# Patient Record
Sex: Male | Born: 1940
Health system: Southern US, Community
[De-identification: ages and names within clinical notes are randomized; demographics above are authoritative.]

## PROBLEM LIST (undated history)

## (undated) DIAGNOSIS — I700 Atherosclerosis of aorta: Secondary | ICD-10-CM

## (undated) DIAGNOSIS — I358 Other nonrheumatic aortic valve disorders: Secondary | ICD-10-CM

## (undated) DIAGNOSIS — G4730 Sleep apnea, unspecified: Secondary | ICD-10-CM

## (undated) DIAGNOSIS — R7989 Other specified abnormal findings of blood chemistry: Secondary | ICD-10-CM

## (undated) DIAGNOSIS — R55 Syncope and collapse: Secondary | ICD-10-CM

## (undated) DIAGNOSIS — E119 Type 2 diabetes mellitus without complications: Secondary | ICD-10-CM

## (undated) DIAGNOSIS — K449 Diaphragmatic hernia without obstruction or gangrene: Secondary | ICD-10-CM

## (undated) DIAGNOSIS — K219 Gastro-esophageal reflux disease without esophagitis: Secondary | ICD-10-CM

## (undated) DIAGNOSIS — Z9861 Coronary angioplasty status: Secondary | ICD-10-CM

## (undated) DIAGNOSIS — I2510 Atherosclerotic heart disease of native coronary artery without angina pectoris: Secondary | ICD-10-CM

## (undated) DIAGNOSIS — F329 Major depressive disorder, single episode, unspecified: Secondary | ICD-10-CM

## (undated) DIAGNOSIS — E785 Hyperlipidemia, unspecified: Secondary | ICD-10-CM

## (undated) DIAGNOSIS — K5730 Diverticulosis of large intestine without perforation or abscess without bleeding: Secondary | ICD-10-CM

## (undated) DIAGNOSIS — R002 Palpitations: Secondary | ICD-10-CM

## (undated) DIAGNOSIS — E538 Deficiency of other specified B group vitamins: Secondary | ICD-10-CM

## (undated) DIAGNOSIS — Z8614 Personal history of Methicillin resistant Staphylococcus aureus infection: Secondary | ICD-10-CM

## (undated) DIAGNOSIS — F32A Depression, unspecified: Secondary | ICD-10-CM

## (undated) DIAGNOSIS — I10 Essential (primary) hypertension: Secondary | ICD-10-CM

## (undated) DIAGNOSIS — M1990 Unspecified osteoarthritis, unspecified site: Secondary | ICD-10-CM

## (undated) HISTORY — DX: Deficiency of other specified B group vitamins: E53.8

## (undated) HISTORY — DX: Unspecified osteoarthritis, unspecified site: M19.90

## (undated) HISTORY — PX: REPLACEMENT TOTAL KNEE: SUR1224

## (undated) HISTORY — DX: Hyperlipidemia, unspecified: E78.5

## (undated) HISTORY — DX: Other nonrheumatic aortic valve disorders: I35.8

## (undated) HISTORY — DX: Gastro-esophageal reflux disease without esophagitis: K21.9

## (undated) HISTORY — DX: Type 2 diabetes mellitus without complications: E11.9

## (undated) HISTORY — DX: Personal history of Methicillin resistant Staphylococcus aureus infection: Z86.14

## (undated) HISTORY — DX: Depression, unspecified: F32.A

## (undated) HISTORY — DX: Major depressive disorder, single episode, unspecified: F32.9

## (undated) HISTORY — DX: Syncope and collapse: R55

## (undated) HISTORY — DX: Essential (primary) hypertension: I10

## (undated) HISTORY — PX: TONSILLECTOMY: SUR1361

## (undated) HISTORY — DX: Diverticulosis of large intestine without perforation or abscess without bleeding: K57.30

## (undated) HISTORY — DX: Atherosclerosis of aorta: I70.0

## (undated) HISTORY — DX: Other specified abnormal findings of blood chemistry: R79.89

## (undated) HISTORY — DX: Diaphragmatic hernia without obstruction or gangrene: K44.9

## (undated) HISTORY — DX: Palpitations: R00.2

---

## 2004-06-12 ENCOUNTER — Emergency Department (HOSPITAL_COMMUNITY): Admission: EM | Admit: 2004-06-12 | Discharge: 2004-06-12 | Payer: Self-pay | Admitting: Emergency Medicine

## 2004-07-17 ENCOUNTER — Encounter (INDEPENDENT_AMBULATORY_CARE_PROVIDER_SITE_OTHER): Payer: Self-pay | Admitting: Specialist

## 2004-07-17 ENCOUNTER — Ambulatory Visit (HOSPITAL_COMMUNITY): Admission: RE | Admit: 2004-07-17 | Discharge: 2004-07-17 | Payer: Self-pay | Admitting: Gastroenterology

## 2006-04-30 ENCOUNTER — Emergency Department (HOSPITAL_COMMUNITY): Admission: EM | Admit: 2006-04-30 | Discharge: 2006-04-30 | Payer: Self-pay | Admitting: Family Medicine

## 2006-09-28 ENCOUNTER — Emergency Department (HOSPITAL_COMMUNITY): Admission: EM | Admit: 2006-09-28 | Discharge: 2006-09-28 | Payer: Self-pay | Admitting: Emergency Medicine

## 2006-12-07 ENCOUNTER — Inpatient Hospital Stay (HOSPITAL_COMMUNITY): Admission: RE | Admit: 2006-12-07 | Discharge: 2006-12-09 | Payer: Self-pay | Admitting: Orthopedic Surgery

## 2007-03-22 ENCOUNTER — Inpatient Hospital Stay (HOSPITAL_COMMUNITY): Admission: RE | Admit: 2007-03-22 | Discharge: 2007-03-26 | Payer: Self-pay | Admitting: Orthopedic Surgery

## 2007-03-22 ENCOUNTER — Ambulatory Visit: Payer: Self-pay | Admitting: Internal Medicine

## 2007-03-25 ENCOUNTER — Ambulatory Visit: Payer: Self-pay | Admitting: Vascular Surgery

## 2007-03-25 ENCOUNTER — Encounter (INDEPENDENT_AMBULATORY_CARE_PROVIDER_SITE_OTHER): Payer: Self-pay | Admitting: Orthopedic Surgery

## 2007-08-04 ENCOUNTER — Ambulatory Visit (HOSPITAL_BASED_OUTPATIENT_CLINIC_OR_DEPARTMENT_OTHER): Admission: RE | Admit: 2007-08-04 | Discharge: 2007-08-04 | Payer: Self-pay | Admitting: Family Medicine

## 2007-08-12 ENCOUNTER — Ambulatory Visit: Payer: Self-pay | Admitting: Internal Medicine

## 2008-07-18 ENCOUNTER — Encounter: Admission: RE | Admit: 2008-07-18 | Discharge: 2008-07-18 | Payer: Self-pay | Admitting: Family Medicine

## 2008-08-03 ENCOUNTER — Encounter: Admission: RE | Admit: 2008-08-03 | Discharge: 2008-08-03 | Payer: Self-pay | Admitting: Family Medicine

## 2010-09-07 ENCOUNTER — Observation Stay: Payer: Self-pay | Admitting: Internal Medicine

## 2011-02-14 ENCOUNTER — Ambulatory Visit: Payer: Self-pay | Admitting: Family Medicine

## 2011-03-21 NOTE — Op Note (Signed)
NAME:  Johnny Kane, Johnny Kane NO.:  [id number]   MEDICAL RECORD NO.:  [id number]          PATIENT TYPE:  INP   LOCATION:  5023                         FACILITY:  [facility]   PHYSICIAN:  Elana Alm. Thurston Kane, M.D. DATE OF BIRTH:  [birth year]   DATE OF PROCEDURE:  12/07/2006  DATE OF DISCHARGE:                               OPERATIVE REPORT   PREOPERATIVE DIAGNOSIS:  Right knee degenerative joint disease.   POSTOPERATIVE DIAGNOSIS:  Right knee degenerative joint disease.   PROCEDURE:  Right total knee replacement using DePuy cemented total knee  system with #5 cemented femur, #5 cemented tibia with 10 mm polyethylene  RP tibial spacer and 38 mm polyethylene cemented patella.   SURGEON:  Elana Alm. Thurston Kane, M.D.   ASSISTANT:  Aura Fey. Bobbe Medico.   ANESTHESIA:  General.   OPERATIVE TIME:  1 hour 20 minutes.   COMPLICATIONS:  None.   DESCRIPTION OF PROCEDURE:  Johnny Kane was brought to operating room on  12/07/2006, placed operative table in supine position.  He received  vancomycin 1 gram IV preoperatively for prophylaxis.  After being placed  under general anesthesia, he had a Foley catheter placed under sterile  conditions without complications.  His right knee was examined.  Range  of motion from -5 to 125 degrees with moderate varus deformity, knee  stable ligamentous exam with normal patellar tracking.  The right leg  was prepped using sterile DuraPrep and draped using sterile technique.  The leg was exsanguinated and thigh tourniquet elevated 365 mm.  Initially through a 15 cm longitudinal incision based over the patella,  initial exposure was made.  Subcutaneous tissues were incised in line with skin incision.  A median  arthrotomy was performed revealing an excessive amount of normal-  appearing joint fluid.  The articular surfaces were inspected.  He had  grade 4 changes medially, grade 3 changes laterally and grade 3 and 4  changes in the patellofemoral joint.   Osteophytes removed from femoral  condyles and tibial plateau and medial amd lateral meniscal remnants  were removed as well as the anterior cruciate ligament.  Intramedullary  drill was then drilled up the femoral canal for placement distal femoral  cutting jig which was placed in the appropriate amount of  rotation and  a distal 12 mm cut was made.  The distal femur was incised.  #5 was  found to be the appropriate size.  #5 cutting jig was placed in the  appropriate manner of external rotation and then these cuts were made.  At this point the proximal tibia was exposed.  The tibial spines were  removed with an oscillating saw.  Intramedullary drill was drilled down  the tibial canal for placement of proximal tibial cutting jig which was  placed in the appropriate amount of rotation and a proximal 6 mm cut was  made based off the medial or lower side.  Spacer blocks were then placed  in flexion and extension.  10 mm blocks gave excellent balancing,  excellent stability and excellent correction of his flexion and varus  deformities.  At this  point the proximal tibia was exposed.  #5 tibial  base plate was placed on the cut tibial surface with an excellent fit  and the keel cut was made.  The PCL box cutter was then placed on the  distal femur and then these cuts were made.  At this point the #5  femoral trial was placed and with the #5 tibial base plate trial and a  10 mm polyethylene RP tibial spacer, the knee was reduced, taken through  range of motion from zero to 125 degrees with excellent stability and  excellent correction of his flexion and varus deformities.  The patella  was incised.  A resurfacing 10 mm cut was made and three locking holes  placed for a 38 mm patellar trial which was placed.  Patellofemoral  tracking was then evaluated and found to be normal.  At this point it is  felt that all trial components were of excellent size and stability.  They were then removed the  knee was then jet lavage irrigated with 3  liters of saline.  The proximal tibia was then exposed and the #5 tibial  baseplate with cement backing was hammered into position with an  excellent fit with excess cement being removed from around the edges.  #5 femoral component with cement backing was hammered into position also  with an excellent fit with excess cement being removed from around the  edges.  The 10 mm polyethylene RP tibial spacer was placed on the tibial  baseplate.  The knee reduced, taken through range of motion from zero to  125 degrees with excellent stability and excellent correction of his  flexion and varus deformities.  The 38 mm polyethylene cement backed  patella was then placed in its position and held there with a clamp.  After the cement hardened again patellofemoral tracking was evaluated  and found to be normal.  At this point it is felt that all components  were excellent size, fit and stability.  The wound was further irrigated  with saline.  The tourniquet was released.  Hemostasis obtained with  cautery.  The arthrotomy was then closed with #1 Ethibond  suture over  two medium Hemovac drains.  Subcutaneous tissues closed with 0 and 2-0  Vicryl, subcuticular layer closed with 4-0 Monocryl.  Steri-Strips were  applied.  Sterile dressings and a long-leg splint applied and the  patient awakened and taken to recovery in stable condition.  Needle,  sponge counts correct x2 at the end of the case.      Johnny Kane, M.D.  Electronically Signed     RAW/MEDQ  D:  12/07/2006  T:  12/08/2006  Job:  [id number]

## 2011-03-21 NOTE — Procedures (Signed)
NAME:  RIKI, GEHRING NO.:  [id number]   MEDICAL RECORD NO.:  [id number]          PATIENT TYPE:  OUT   LOCATION:  SLEEP CENTER                 FACILITY:  [facility]   PHYSICIAN:  Clinton D. Maple Hudson, MD, FCCP, FACPDATE OF BIRTH:  Jun 13, 1941   DATE OF STUDY:                            NOCTURNAL POLYSOMNOGRAM   REFERRING PHYSICIAN:  Burnell Blanks, MD   INDICATION FOR STUDY:  Hypersomnia, with sleep apnea.   EPWORTH SLEEPINESS SCORE:  4/24.  BMI 34, weight 230 pounds.   MEDICATIONS:  Home medications are listed and reviewed.   SLEEP ARCHITECTURE:  Total sleep time 366 minutes, with sleep efficiency  90%.  Stage I was 3%, stage II 97%, stage III absent, REM absent.  Sleep  latency 12 minutes.  Awake after sleep onset 30 minutes.  Arousal index  32.1, indicating increased EEG arousal, with fragmentation.  No bedtime  medication was taken.   RESPIRATORY DATA:  Apnea/hypopnea index (AHI, RDI) 1.3 obstructive  events per hour, which is within normal limits (normal range 0-5 per  hour).  This included 1 central apnea, 1 mixed apnea, and 6 hypopneas.  There were insufficient events to permit CPAP titration by split  protocol on the study night.   OXYGEN DATA:  Intermittent mild to moderate snoring, with oxygen  desaturation nadir 88%.  Mean oxygen saturation through the study was  91% on room air.   CARDIAC DATA:  Sinus rhythm, with frequent PVCs.   MOVEMENT/PARASOMNIA:  Frequent limb jerks, with a total of 815 recorded,  of which 75 were associated with arousal or awakening for periodic limb  movement with arousal index of 12.3 per hour.   IMPRESSIONS/RECOMMENDATIONS:  1. Increased EEG arousal suggesting sleep fragmentation and      nonrestorative sleep.  2. Occasional respiratory events, well within normal limits, AHI 1.3      per hour (normal range 0-5 per hour).  Mild intermittent snoring,      with oxygen saturation nadir of 88%.  3. Frequent PVCs.  4. Periodic  limb movement with arousal syndrome, 12.3 per hour.      Consider if therapy for sleep-related      movement disturbances such as restless leg or periodic limb      movement with arousal syndrome might justify a trial of specific      therapy such as Requip or Mirapex if appropriate.      Clinton D. Maple Hudson, MD, [facility], FACP  Diplomate, Biomedical engineer of Sleep Medicine  Electronically Signed     CDY/MEDQ  D:  08/12/2007 16:11:24  T:  08/13/2007 09:54:04  Job:  [id number]

## 2011-03-21 NOTE — Discharge Summary (Signed)
NAME:  Johnny Kane, Johnny Kane NO.:  [id number]   MEDICAL RECORD NO.:  [id number]          PATIENT TYPE:  INP   LOCATION:  5010                         FACILITY:  [facility]   PHYSICIAN:  Elana Alm. Thurston Hole, M.D. DATE OF BIRTH:  1941/10/18   DATE OF ADMISSION:  03/22/2007  DATE OF DISCHARGE:  03/26/2007                               DISCHARGE SUMMARY   ADMITTING DIAGNOSES:  1. End-stage degenerative joint disease left knee.  2. Hypertension.   DISCHARGE DIAGNOSES:  1. End-stage degenerative joint disease left knee.  2. Hypertension.  3. Atelectasis.  4. Syncopal.  5. Remote history of methicillin-resistant Staphylococcus aureus.  6. Cardiac arrhythmia.   HISTORY OF PRESENT ILLNESS:  The patient is a 70 year old white male  with a history of bilateral knee end-stage DJD.  He underwent a right  total knee replacement 3 months ago and did very well with this.  He now  has end-stage DJD of his left knee that has failed conservative care.  He understands risks, benefits, and possible complications of a left  total knee replacement and is without question.   He does have a remote history of a MRSA infection on his left leg.  Therefore, vancomycin was used as preoperative and postoperative  antibiotic.   PROCEDURES IN-HOUSE:  On Mar 22, 2007, the patient underwent a left  total knee replacement by Dr. Thurston Hole and a left femoral nerve block by  anesthesia.  He tolerated both procedures well and was placed in a CPM 0-  90 degrees postoperatively.  Coumadin was started postoperative day 0,  an Autovac was used.  Postoperative day #1, hemoglobin was 10.8, white  cell count was 10.5, temperature was 99.4.  His wound was dressed.  He  had a CPM 0-90 degrees 8 hours a day.  His IV vancomycin was  discontinued.  He was started on doxycycline 100 mg one tablet twice a  day for 14 days due to his history of MRSA in that leg.  His Foley was  discontinued, his PCA was discontinued and his  dressing was changed.  Postoperative day #2, on initial exam the patient was doing well.  Temperature was 99.9.  Labs, hemoglobin was 10.3.  He was alert and  oriented in bed.  His calf was soft, nontender.  He was tolerating a CPM  0-90 degrees.  After exam, he got up and went to the bathroom when he  had an episode of nausea while in the bathroom.  He got up to leave the  bathroom, passed out and hit his head on the door.  The patient was  found lying on the floor face-up.  Initial vital signs were 124/84,  respirations were 18.  He had brisk pulses.  CBG was 185.  Head and neck  were stabilized.  Rapid response was called.  Head CT was done, which  came back negative.  Cardiac enzymes were drawn.  Critical care was  consulted due to his syncopal episode.  EKG did show ectopic beats.  He  was transferred to the step-down unit.  An echocardiogram was ordered.  He was noted to have some hypomagnesemia and was given some magnesium.  A chest CT showed no PE, just some slight atelectasis.  Postoperative  day #3, the patient was still under the care of critical care.  He was  continued on Coumadin for DVT prophylaxis.  Head and neck CT were  completely negative.  His nausea had subsided.  His hypomagnesemia had  resolved.  Postoperative day #3, the patient was significantly improved  but did complain of some calf pain.  Dopplers were done to rule out a  DVT.  His echocardiogram showed 55-60% ejection fraction.  He was stable  with regards to his EKG and his Doppler.  He has been cleared for  physical therapy.  Postoperative day #4, T-max of 100.7.  Hemoglobin was  9.8.  His INR was 1.2.  He tolerated the CPM 0-90 degrees.   He was discharged to home in stable condition on:  1. Oxy-IR 5 mg one to two q.4-6h. p.r.n. pain.  2. Robaxin 500 mg one q.4-6h. p.r.n. muscle spasms.  3. Coumadin 10 mg daily.  4. Lovenox 30 mg twice a day daily until INR is therapeutic between 2      and 3.  5.  Doxycycline 100 mg one tablet twice a day until he is 14 days      postoperative.  6. Atacand 33 mg daily.  He will hold this until his blood pressure is      greater than 120/80.   He will follow up with Dr. Thurston Hole in 2 weeks.  He will call with  increased pain, increased swelling, increased redness, shortness of  breath, chest pain, nausea or vomiting.      Kirstin Shepperson, P.A.      Robert A. Thurston Hole, M.D.  Electronically Signed    KS/MEDQ  D:  05/04/2007  T:  05/04/2007  Job:  [id number]

## 2011-03-21 NOTE — Op Note (Signed)
NAME:  Johnny Kane, Johnny Kane NO.:  [id number]   MEDICAL RECORD NO.:  [id number]          PATIENT TYPE:  INP   LOCATION:  5017                         FACILITY:  [facility]   PHYSICIAN:  Elana Alm. Thurston Hole, M.D. DATE OF BIRTH:  05-11-1941   DATE OF PROCEDURE:  03/22/2007  DATE OF DISCHARGE:                               OPERATIVE REPORT   PREOPERATIVE DIAGNOSIS:  Left knee degenerative joint disease.   POSTOPERATIVE DIAGNOSIS:  Left knee degenerative joint disease.   PROCEDURE:  Left total knee replacement using DePuy cemented total knee  system with #5 cemented femur, #5 cemented tibia, a 12.5 mm polyethylene  pretibial spacer and 38 mm polyethylene cemented patella.   SURGEON:  Elana Alm. Thurston Hole, M.D.   ASSISTANT:  Julien Girt, P.A.   ANESTHESIA:  General.   OPERATIVE TIME:  1 hour and 30 minutes.   COMPLICATIONS:  None.   DESCRIPTION OF PROCEDURE:  Johnny Kane was brought to the operating room  on 03/22/2007 after a femoral nerve block was placed by anesthesia.  He  was placed on the operating table in supine position.  He received  vancomycin 1 gm IV preoperatively for prophylaxis.  After being placed  under general anesthesia he had a Foley catheter placed under sterile  conditions.  His left knee was examined.  Range of motion from -8 to 120  degrees with moderate varus deformity, knee stable, no abnormality seen,  with normal patellar tracking.  The left leg was prepped using sterile  prep and drape and using sterile technique.  The leg was then  exsanguinated and a thigh tourniquet elevated to  365 mmHg.  Initially  through a 15 cm longitudinal incision based over the patella initial  exposure was made.  The subcutaneous tissues were incised along the skin  incision.  A median arthrotomy was performed revealing an excessive  amount of normal appearing joint fluid.  The articular surfaces were  inspected.  He had grade 4 changes medially, grade 3 changes  laterally  and grade 3-4 changes in the patellofemoral joint.  Osteophytes were  removed from the femoral condyles and tibial plateau.  The medial and  lateral meniscal remnants were removed, as well as the anterior cruciate  ligament.  Intramedullary drill was then drilled up the femoral canal  for placement of distal femoral cutting jig which was placed in the  appropriate amount of rotation and a distal 11 mm cut was made.  The  distal femur was then sized.  A #5 was found to be the appropriate size.  A #5 cutting jig was placed, and then these cuts were made in the  appropriate amount of external rotation.   The proximal tibia was then exposed.  The tibial spines were removed  with an oscillating saw.  The intramedullary drill was drilled down the  tibial canal for placement of the proximal tibial cutting jig which was  placed in the appropriate amount of rotation and a proximal 8 mm cut was  made based off the medial and lower side.  Spacer blocks were then  placed in  flexion and extension.  The 12.5 mm blocks gave excellent  balance, excellent stability and excellent correction of his flexion and  varus deformities.  At this point the proximal tibial cut surface was  exposed and a #5 tibial base plate was placed on the cut surface.  This  gave excellent fit and then a keel cut was made.  The PCL box cutter was  then placed on the distal femur, and these cuts were made.  At this  point a #5 femoral trial was placed with the #5 tibial base plate trial,  and a 12.5 mm RP tibial spacer.  The knee was reduced and taken through  a range of motion from 0-125 degrees with excellent stability and  excellent correction of his flexion and varus deformities.   The patella was then sized.  A resurfacing 10 mm cut was made and 3  locking holes placed for a 38 mm patella.  The patella trial was placed.  Patellofemoral tracking was evaluated and this was found to be normal.  At this point it was  felt that all the trial components were of  excellent size, fit and stability.  They were then removed.  The knee  was then jet lavaged irrigated with 3 liters of saline.  The proximal  tibia was again exposed.  A #5 tibial base plate with cement backing was  hammered into position with an excellent fit with excess cement being  removed from around the edges.  A #5 femoral component with cement  backing was hammered into position also with an excellent fit with  excess cement being removed from around the edges.  The 12.5 mm  polyethylene RP tibial spacer was placed on the tibial base plate.  The  knee was reduced and taken through a range of motion from 0 to 125  degrees with excellent stability and excellent correction of his flexion  and varus deformities.  A 38 mm polyethylene cement backing patella was  then placed in its position and held there with a clamp.  After the  cement hardened again patellofemoral tracking was evaluated and this was  found to be normal.   At this point it was felt that all the components were of excellent  size, fit and stability.  The wound was further irrigated with saline.  The tourniquet was released.  Hemostasis was obtained with cautery.  The  arthrotomy was then closed with #1 Ethibond suture over two medium  Hemovac drains.  Subcutaneous tissues were closed with 0 and 2-0 Vicryl,  subcuticular layer was closed with 4-0 Monocryls, sterile dressings and  a long-leg splint were applied.  The patient was then awakened,  extubated and taken to the recovery room in stable condition.  Needle  and sponge counts were correct x2 at the end of the case.      Robert A. Thurston Hole, M.D.  Electronically Signed     RAW/MEDQ  D:  03/22/2007  T:  03/22/2007  Job:  [id number]

## 2011-03-21 NOTE — Op Note (Signed)
NAME:  Johnny Kane, Johnny Kane NO.:  [id number]   MEDICAL RECORD NO.:  [id number]                   PATIENT TYPE:  AMB   LOCATION:  ENDO                                 FACILITY:  [facility]   PHYSICIAN:  Bernette Redbird, M.D.                DATE OF BIRTH:  [birth year]   DATE OF PROCEDURE:  07/17/2004  DATE OF DISCHARGE:                                 OPERATIVE REPORT   DATE OF PROCEDURE:  07/17/2004   PROCEDURE:  Colonoscopy with polypectomy and submucosal injection.   INDICATION:  This is a 70 year old gentleman for colon cancer screening.  No  previous screening procedures, no risk factors or symptoms.   FINDINGS:  Several small polyps removed.   PROCEDURE:  The nature, purpose and risks of the procedure had been  discussed with the patient, who provided written consent.  Sedation was  fentanyl 75 mcg and Versed 6.5 mg IV without arrhythmias or desaturation.  Digital exam, the prostate was unremarkable.  The Olympus adult video  colonoscope was advanced with moderate looping but with the help of external  abdominal compression, and with the scope fully inserted, I was just barely  able to reach the cecum.  Pull back was then performed.  The quality and  prep were excellent and it is felt that all areas were well.   On the way in, in the ascending colon, there was a 4 mm sessile polyp  removed by snare technique and suctioned through the scope per retrieval.  At the base of the cecum was a 4 x 8 mm sessile polyp injected with Epi and  then snared off initially without cautery and then with Erbe cautery to  finish coming through the tissue.  That polyp was Bovie suctioned through  the scope.  There was complete hemostasis and no evidence of excessive  cautery.  Finally, in the rectum, there was a small, roughly 3 mm, polyp  removed by snare technique, again initially frozen and touched up with  cautery.  Those fragments were also suctioned through the scope.  No  large  polyps, cancer, colitis or vascular malformations were seen.  The patient  did have right sided and left sided diverticulosis.  Retroflexion could not  be accomplished in the rectum due to a small rectal ampulla.  Reinspection  of the rectum, however, was otherwise unremarkable.   The patient tolerated the procedure well and there were no apparent  complications.   IMPRESSION:  1.  Colon polyps removed as described above (211.3).  2.  Scattered diverticulosis.  3.  Colonic redundancy.   PLAN:  Await pathology results.                                               Bernette Redbird, M.D.  RB/MEDQ  D:  07/17/2004  T:  07/17/2004  Job:  [id number]   cc:   Lianne Bushy, M.D.  [address]  [place]  [place] [address]  Fax: [phone]

## 2011-06-23 ENCOUNTER — Encounter: Payer: Self-pay | Admitting: Cardiology

## 2011-07-09 ENCOUNTER — Ambulatory Visit (INDEPENDENT_AMBULATORY_CARE_PROVIDER_SITE_OTHER): Payer: Medicare Other | Admitting: Cardiology

## 2011-07-09 ENCOUNTER — Encounter: Payer: Self-pay | Admitting: Cardiology

## 2011-07-09 VITALS — BP 134/82 | HR 60 | Ht 72.0 in | Wt 233.8 lb

## 2011-07-09 DIAGNOSIS — I714 Abdominal aortic aneurysm, without rupture, unspecified: Secondary | ICD-10-CM

## 2011-07-09 DIAGNOSIS — I10 Essential (primary) hypertension: Secondary | ICD-10-CM

## 2011-07-09 DIAGNOSIS — R0609 Other forms of dyspnea: Secondary | ICD-10-CM

## 2011-07-09 DIAGNOSIS — E785 Hyperlipidemia, unspecified: Secondary | ICD-10-CM

## 2011-07-09 DIAGNOSIS — R0989 Other specified symptoms and signs involving the circulatory and respiratory systems: Secondary | ICD-10-CM

## 2011-07-09 DIAGNOSIS — R0600 Dyspnea, unspecified: Secondary | ICD-10-CM

## 2011-07-09 NOTE — Progress Notes (Signed)
Lazarus Gowda Date of Birth: [birth year]   History of Present Illness: Mr. Johnny Kane is seen today for evaluation at the request of Dr. Nathanial Rancher. He is a pleasant 70 year old white male was seen for evaluation of dyspnea. He been experiencing shortness of breath for as long as he can remember. It seemed to be slowly progressive. Dr. Miguel Rota notes  mention that he had a productive cough. He felt more fatigued. He denies any symptoms of chest pain. He does have cardiac risk factors of hypertension, hyperlipidemia, and an abdominal aneurysm. He reports that he had had previous stress testing prior to knee surgery and that everything checked out okay. He did have an echocardiogram in 2008 which was unremarkable. Patient went into a long explanation today about his dog of 12 years and had to be put down. He since has adopted 2 new dogs. Since he got these dogs all his symptoms have resolved including his shortness of breath and cough. He's been walking in the woods twice a day with his dogs and no longer has shortness of breath or leg pain. He almost thought about canceling his visit today.  Current Outpatient Prescriptions on File Prior to Visit  Medication Sig Dispense Refill  . aspirin 81 MG tablet Take 81 mg by mouth daily.        . candesartan (ATACAND) 32 MG tablet Take 32 mg by mouth daily.        . citalopram (CELEXA) 20 MG tablet Take 20 mg by mouth daily.        Tery Sanfilippo Calcium (STOOL SOFTENER PO) Take by mouth at bedtime.        . fish oil-omega-3 fatty acids 1000 MG capsule Take 3 g by mouth daily.        . modafinil (PROVIGIL) 200 MG tablet Take 200 mg by mouth daily.        Marland Kitchen lidocaine (XYLOCAINE) 2 % solution Take 20 mLs by mouth as needed.        . vitamin B-12 (CYANOCOBALAMIN) 1000 MCG tablet Take 1,000 mcg by mouth daily.          Allergies  Allergen Reactions  . Nexium   . Penicillins   . Soma (Carisoprodol)   . Sulfa Drugs Cross Reactors     Past Medical History    Diagnosis Date  . AAA (abdominal aortic aneurysm)   . Hiatal hernia   . Diverticulosis of colon   . History of MRSA infection   . Hypertension   . Depression   . Hyperlipidemia   . OA (osteoarthritis)   . GERD (gastroesophageal reflux disease)   . DOE (dyspnea on exertion)   . Palpitations   . Vitamin B 12 deficiency     Past Surgical History  Procedure Date  . Replacement total knee   . US echocardiography 03/2007    EF 55-60%    History  Smoking status  . Former Smoker  . Quit date: 11/04/1967  Smokeless tobacco  . Not on file    History  Alcohol Use No    Family History  Problem Relation Age of Onset  . Asthma Mother   . Hypertension Mother   . Heart attack Father     Review of Systems: The review of systems is positive for weight loss of about 10 pounds. His symptoms of fatigue are much better. His appetite has been good.  All other systems were reviewed and are negative.  Physical Exam: BP 134/82  Pulse  60  Ht 6' (1.829 m)  Wt 233 lb 12.8 oz (106.051 kg)  BMI 31.71 kg/m2 The patient is alert and oriented x 3.  The mood and affect are normal.  The skin is warm and dry.  Color is normal.  The HEENT exam reveals that the sclera are nonicteric.  The mucous membranes are moist.  The carotids are 2+ without bruits.  There is no thyromegaly.  There is no JVD.  The lungs are clear.  The chest wall is non tender.  The heart exam reveals a regular rate with a normal S1 and S2.  There are no murmurs, gallops, or rubs.  The PMI is not displaced.   Abdominal exam reveals good bowel sounds.  There is no guarding or rebound.  There is no hepatosplenomegaly or tenderness.  There are no masses.  Exam of the legs reveal no clubbing, cyanosis, or edema.  The legs are without rashes.  The distal pulses are intact.  Cranial nerves II - XII are intact.  Motor and sensory functions are intact.  The gait is normal. LABORATORY DATA: ECG today is normal.  Assessment / Plan:

## 2011-07-09 NOTE — Patient Instructions (Signed)
We will schedule you for a nuclear stress test.  If this is normal then I would encourage you to increase your aerobic activity with your dogs.

## 2011-07-09 NOTE — Assessment & Plan Note (Signed)
It appears that his symptoms of progressive dyspnea have largely resolved. He does want to make sure that his heart health is good since he has a lot more to live for. I suggested that since he does have multiple risk factors and did have the symptoms it might be prudent to do a nuclear stress test. He is agreeable to this plan and we will schedule him for a Lexiscan Myoview study. If this is normal then I would reassure him and recommended he continue with regular aerobic exercise.

## 2011-07-14 ENCOUNTER — Encounter: Payer: Self-pay | Admitting: *Deleted

## 2011-07-22 ENCOUNTER — Ambulatory Visit (HOSPITAL_COMMUNITY): Payer: Medicare Other | Attending: Cardiology | Admitting: Radiology

## 2011-07-22 VITALS — Ht 72.0 in | Wt 230.0 lb

## 2011-07-22 DIAGNOSIS — R0609 Other forms of dyspnea: Secondary | ICD-10-CM | POA: Insufficient documentation

## 2011-07-22 DIAGNOSIS — R0989 Other specified symptoms and signs involving the circulatory and respiratory systems: Secondary | ICD-10-CM | POA: Insufficient documentation

## 2011-07-22 DIAGNOSIS — R0789 Other chest pain: Secondary | ICD-10-CM

## 2011-07-22 DIAGNOSIS — R0600 Dyspnea, unspecified: Secondary | ICD-10-CM

## 2011-07-22 MED ORDER — TECHNETIUM TC 99M TETROFOSMIN IV KIT
11.0000 | PACK | Freq: Once | INTRAVENOUS | Status: AC | PRN
  Administered 2011-07-22: 11 via INTRAVENOUS

## 2011-07-22 MED ORDER — TECHNETIUM TC 99M TETROFOSMIN IV KIT
33.0000 | PACK | Freq: Once | INTRAVENOUS | Status: AC | PRN
  Administered 2011-07-22: 33 via INTRAVENOUS

## 2011-07-22 MED ORDER — REGADENOSON 0.4 MG/5ML IV SOLN
0.4000 mg | Freq: Once | INTRAVENOUS | Status: AC
  Administered 2011-07-22: 0.4 mg via INTRAVENOUS

## 2011-07-22 NOTE — Progress Notes (Signed)
[facility] SITE 3 NUCLEAR MED [address] [place] [address] [phone]  Cardiology Nuclear Med Study  Johnny Kane is a 70 y.o. male [id number] 1941/04/23   Nuclear Med Background Indication for Stress Test:  Evaluation for Ischemia History:  '08 Echo:EF=60%; ? '09 MPS:OK per patient Cardiac Risk Factors: Family History - CAD, History of Smoking, Hypertension, Obesity and PVD (4 cm AAA)  Symptoms:  Chest Pressure with and without Exertion (last episode of chest discomfort was about one month ago), DOE, Fatigue and Syncope   Nuclear Pre-Procedure Caffeine/Decaff Intake:  None NPO After: 9:00pm   Lungs:  Clear.  O2 Sat 96% on RA. IV 0.9% NS with Angio Cath:  20g  IV Site: R Antecubital  IV Started by:  Stanton Kidney, EMT-P  Chest Size (in):  44 Cup Size: n/a  Height: 6' (1.829 m)  Weight:  230 lb (104.327 kg)  BMI:  Body mass index is 31.19 kg/(m^2). Tech Comments:  N/A    Nuclear Med Study 1 or 2 day study: 1 day  Stress Test Type:  Lexiscan  Reading MD: Kristeen Miss, MD  Order Authorizing Provider:  Peter [place], MD  Resting Radionuclide: Technetium 21m Tetrofosmin  Resting Radionuclide Dose: 10.9 mCi   Stress Radionuclide:  Technetium 49m Tetrofosmin  Stress Radionuclide Dose: 33.0 mCi           Stress Protocol Rest HR: 80 Stress HR: 101  Rest BP: 146/81 Stress BP: 133/67  Exercise Time (min): n/a METS: n/a   Predicted Max HR: 148 bpm % Max HR: 68.24 bpm Rate Pressure Product: 21308   Dose of Adenosine (mg):  n/a Dose of Lexiscan: 0.4 mg  Dose of Atropine (mg): n/a Dose of Dobutamine: n/a mcg/kg/min (at max HR)  Stress Test Technologist: Smiley Houseman, CMA-N  Nuclear Technologist:  Domenic Polite, CNMT     Rest Procedure:  Myocardial perfusion imaging was performed at rest 45 minutes following the intravenous administration of Technetium 30m Tetrofosmin.  Rest ECG: No acute changes.  Stress Procedure:  The patient received IV  Lexiscan 0.4 mg over 15-seconds.  Technetium 33m Tetrofosmin injected at 30-seconds.  There were no significant changes with Lexiscan.  He did c/o chest tightness with infusion.  Quantitative spect images were obtained after a 45 minute delay. Stress ECG:  There are no ST or T wave changes to suggest ischemia.  QPS Raw Data Images:  Normal; no motion artifact; normal heart/lung ratio. Stress Images:  Normal homogeneous uptake in all areas of the myocardium. Rest Images:  Normal homogeneous uptake in all areas of the myocardium. Subtraction (SDS):  No evidence of ischemia. Transient Ischemic Dilatation (Normal <1.22):  1.11 Lung/Heart Ratio (Normal <0.45):  0.29  Quantitative Gated Spect Images QGS EDV:  99 ml QGS ESV:  34 ml QGS cine images:  NL LV Function; NL Wall Motion QGS EF: 65%  Impression Exercise Capacity:  Lexiscan with no exercise. BP Response:  Normal blood pressure response. Clinical Symptoms:  No chest pain. ECG Impression:  No significant ST segment change suggestive of ischemia. Comparison with Prior Nuclear Study: No images to compare  Overall Impression:  Normal stress nuclear study.  No evidence of ischemia.  Normal LV function.    Vesta Mixer, Montez Hageman., MD, [facility]

## 2011-07-23 ENCOUNTER — Telehealth: Payer: Self-pay | Admitting: *Deleted

## 2011-07-23 NOTE — Telephone Encounter (Signed)
Spoke w/wife. Gave her stress test results. Will send copy to Dr. Nathanial Rancher

## 2011-07-23 NOTE — Telephone Encounter (Signed)
Message copied by Lorayne Bender on Wed Jul 23, 2011  4:37 PM ------      Message from: [place], PETER M      Created: Wed Jul 23, 2011 12:58 PM       Normal nuclear stress test. Please report. Continue exercise.      Theron Arista [place]

## 2011-12-11 ENCOUNTER — Other Ambulatory Visit: Payer: Self-pay | Admitting: Family Medicine

## 2011-12-11 DIAGNOSIS — I712 Thoracic aortic aneurysm, without rupture: Secondary | ICD-10-CM

## 2011-12-16 ENCOUNTER — Ambulatory Visit
Admission: RE | Admit: 2011-12-16 | Discharge: 2011-12-16 | Disposition: A | Discharge status: Home / Self Care | Payer: Medicare Other | Source: Ambulatory Visit | Attending: Family Medicine | Admitting: Family Medicine

## 2011-12-16 DIAGNOSIS — I712 Thoracic aortic aneurysm, without rupture: Secondary | ICD-10-CM

## 2011-12-16 MED ORDER — IOHEXOL 300 MG/ML  SOLN
100.0000 mL | Freq: Once | INTRAMUSCULAR | Status: AC | PRN
  Administered 2011-12-16: 100 mL via INTRAVENOUS

## 2013-04-04 ENCOUNTER — Other Ambulatory Visit: Payer: Self-pay | Admitting: Gastroenterology

## 2013-07-11 ENCOUNTER — Ambulatory Visit (INDEPENDENT_AMBULATORY_CARE_PROVIDER_SITE_OTHER): Payer: Medicare Other | Admitting: Surgery

## 2013-07-11 ENCOUNTER — Encounter (INDEPENDENT_AMBULATORY_CARE_PROVIDER_SITE_OTHER): Payer: Self-pay | Admitting: Surgery

## 2013-07-11 VITALS — BP 140/78 | HR 80 | Temp 97.4°F | Resp 15 | Ht 71.0 in | Wt 237.2 lb

## 2013-07-11 DIAGNOSIS — K8010 Calculus of gallbladder with chronic cholecystitis without obstruction: Secondary | ICD-10-CM

## 2013-07-11 DIAGNOSIS — K8066 Calculus of gallbladder and bile duct with acute and chronic cholecystitis without obstruction: Secondary | ICD-10-CM

## 2013-07-11 NOTE — Progress Notes (Signed)
Patient ID: Johnny Kane, male   DOB: 08/06/1941, 72 y.o.   MRN: [id number]  Chief Complaint  Patient presents with  . New Evaluation    eval GB    HPI Johnny Kane is a 72 y.o. male.  Patient sent at the request of Dr. [name] for epigastric abdominal pain. This is been present for at least a year. Pain medications epigastrium and right upper quadrant of his abdomen. There is some radiation into his lower chest. He has heartburn. It is made worse with fatty greasy foods. His diet is been restricted to low fat. The discomfort is becoming more frequent. Episodes last minutes to hours. The pain is sharp in nature. He recently had an EGD which showed no ulcer. Ultrasound was done which showed multiple gallstones and thickened gallbladder wall. This was similar to ultrasound done in 2009 are similar symptoms. HPI  Past Medical History  Diagnosis Date  . AAA (abdominal aortic aneurysm)   . Hiatal hernia   . Diverticulosis of colon   . History of MRSA infection   . Hypertension   . Depression   . Hyperlipidemia   . OA (osteoarthritis)   . GERD (gastroesophageal reflux disease)   . DOE (dyspnea on exertion)   . Palpitations   . Vitamin B 12 deficiency     Past Surgical History  Procedure Laterality Date  . Replacement total knee      one on each knee  . Us echocardiography  03/2007    EF 55-60%    Family History  Problem Relation Age of Onset  . Asthma Mother   . Hypertension Mother   . Heart attack Father     Social History History  Substance Use Topics  . Smoking status: Former Smoker    Quit date: 11/04/1967  . Smokeless tobacco: Never Used  . Alcohol Use: No    Allergies  Allergen Reactions  . Esomeprazole Magnesium   . Penicillins   . Soma [Carisoprodol]   . Sulfa Drugs Cross Reactors     Current Outpatient Prescriptions  Medication Sig Dispense Refill  . aspirin 81 MG tablet Take 81 mg by mouth daily.        . BENICAR 40 MG tablet       . citalopram  (CELEXA) 20 MG tablet Take 20 mg by mouth daily.        . famotidine (PEPCID) 20 MG tablet       . modafinil (PROVIGIL) 200 MG tablet Take 200 mg by mouth daily.        . polyethylene glycol powder (MIRALAX) powder Take 17 g by mouth daily.      . pravastatin (PRAVACHOL) 40 MG tablet       . vitamin B-12 (CYANOCOBALAMIN) 1000 MCG tablet Take 1,000 mcg by mouth daily.        . lidocaine (XYLOCAINE) 2 % solution Take 20 mLs by mouth as needed.         No current facility-administered medications for this visit.    Review of Systems Review of Systems  HENT: Positive for hearing loss.   Eyes: Negative.   Respiratory: Negative.   Cardiovascular: Negative.   Gastrointestinal: Positive for constipation.  Endocrine: Negative.   Genitourinary: Negative.   Musculoskeletal: Positive for arthralgias.  Allergic/Immunologic: Negative.   Neurological: Positive for weakness.  Psychiatric/Behavioral: Negative.     Blood pressure 140/78, pulse 80, temperature 97.4 F (36.3 C), temperature source Temporal, resp. rate 15, height 5' 11" (1.803   m), weight 237 lb 3.2 oz (107.593 kg).  Physical Exam Physical Exam  Constitutional: He is oriented to person, place, and time. He appears well-developed and well-nourished.  HENT:  Head: Normocephalic and atraumatic.  Eyes: EOM are normal. Pupils are equal, round, and reactive to light.  Neck: Normal range of motion. Neck supple.  Cardiovascular: Normal rate and regular rhythm.   Pulmonary/Chest: Effort normal and breath sounds normal.  Abdominal: Soft. Bowel sounds are normal. He exhibits no distension and no mass. There is no tenderness. There is no rebound and no guarding.  Musculoskeletal: Normal range of motion.  Neurological: He is alert and oriented to person, place, and time.  Skin: Skin is warm and dry.  Psychiatric: He has a normal mood and affect. His behavior is normal. Judgment and thought content normal.    Data Reviewed [facility]trasound from  [facility] shows gallstones and thickened gallbladder wall and nl CBD.    Assessment    Cholelithiasis and chronic cholecystitis    Plan    Recommend laparoscopic cholecystectomy with cholangiogram.The procedure has been discussed with the patient. Operative and non operative treatments have been discussed. Risks of surgery include bleeding, infection,  Common bile duct injury,  Injury to the stomach,liver, colon,small intestine, abdominal wall,  Diaphragm,  Major blood vessels,  And the need for an open procedure.  Other risks include worsening of medical problems, death,  DVT and pulmonary embolism, and cardiovascular events.   Medical options have also been discussed. The patient has been informed of long term expectations of surgery and non surgical options,  The patient agrees to proceed.   Discussed success rates of 80% at relieving his symptoms. He may have reflux on top of this as well as chronic constipation which can be contributing to his symptoms. He understands this and would like to proceed with surgery.       Johnny Kane A. 07/11/2013, 10:40 AM    

## 2013-07-11 NOTE — Progress Notes (Signed)
Dr Luisa Hart-   When you get a chance NEED PRE OP ORDERS PLEASE-  Has  PST APPT 07/14/13  [facility]

## 2013-07-11 NOTE — Patient Instructions (Addendum)

## 2013-07-12 ENCOUNTER — Encounter (HOSPITAL_COMMUNITY): Payer: Self-pay | Admitting: Pharmacy Technician

## 2013-07-14 ENCOUNTER — Encounter (HOSPITAL_COMMUNITY)
Admission: RE | Admit: 2013-07-14 | Discharge: 2013-07-14 | Disposition: A | Discharge status: Home / Self Care | Payer: Medicare Other | Source: Ambulatory Visit | Attending: Surgery | Admitting: Surgery

## 2013-07-14 ENCOUNTER — Ambulatory Visit (HOSPITAL_COMMUNITY)
Admission: RE | Admit: 2013-07-14 | Discharge: 2013-07-14 | Disposition: A | Discharge status: Home / Self Care | Payer: Medicare Other | Source: Ambulatory Visit | Attending: Surgery | Admitting: Surgery

## 2013-07-14 ENCOUNTER — Encounter (HOSPITAL_COMMUNITY): Payer: Self-pay

## 2013-07-14 DIAGNOSIS — K8020 Calculus of gallbladder without cholecystitis without obstruction: Secondary | ICD-10-CM | POA: Insufficient documentation

## 2013-07-14 DIAGNOSIS — Z01818 Encounter for other preprocedural examination: Secondary | ICD-10-CM | POA: Insufficient documentation

## 2013-07-14 DIAGNOSIS — Z01810 Encounter for preprocedural cardiovascular examination: Secondary | ICD-10-CM | POA: Insufficient documentation

## 2013-07-14 DIAGNOSIS — Z01812 Encounter for preprocedural laboratory examination: Secondary | ICD-10-CM | POA: Insufficient documentation

## 2013-07-14 DIAGNOSIS — M899 Disorder of bone, unspecified: Secondary | ICD-10-CM | POA: Insufficient documentation

## 2013-07-14 HISTORY — DX: Sleep apnea, unspecified: G47.30

## 2013-07-14 LAB — BASIC METABOLIC PANEL
BUN: 19 mg/dL (ref 6–23)
CO2: 30 mEq/L (ref 19–32)
Chloride: 104 mEq/L (ref 96–112)
Creatinine, Ser: 1.06 mg/dL (ref 0.50–1.35)
GFR calc non Af Amer: 67 mL/min — ABNORMAL LOW (ref >90)
Glucose, Bld: 85 mg/dL (ref 70–99)

## 2013-07-14 LAB — CBC
HCT: 41 % (ref 39.0–52.0)
Hemoglobin: 14.2 g/dL (ref 13.0–17.0)
MCV: 86.1 fL (ref 78.0–100.0)
RDW: 13.6 % (ref 11.5–15.5)
WBC: 6.9 10*3/uL (ref 4.0–10.5)

## 2013-07-14 NOTE — Progress Notes (Signed)
Stress Test 07/22/2011 EPIc  07/09/11 Last office visit with Dr Peter [place] in [facility]

## 2013-07-14 NOTE — Progress Notes (Signed)
2013 - See CTscan in EPIC - AAA- not seen.

## 2013-07-14 NOTE — Patient Instructions (Addendum)
Johnny Kane  07/14/2013   Your procedure is scheduled on:  07/20/13               Surgery 1230pm-200pm  Report to [facility] at      1000  AM.  Call this number if you have problems the morning of surgery: [phone]   Remember:   Do not eat food or drink liquids after midnight.   Take these medicines the morning of surgery with A SIP OF WATER:    Do not wear jewelry,   Do not wear lotions, powders, or perfumes.   . Men may shave face and neck.  Do not bring valuables to the hospital.  Contacts, dentures or bridgework may not be worn into surgery.     Patients discharged the day of surgery will not be allowed to drive  home.  Name and phone number of your driver:    SEE CHG INSTRUCTION SHEET    Please read over the following fact sheets that you were given:, coughing and deep breathing exercises, leg exercises               Failure to comply with these instructions may result in cancellation of your surgery.                Patient Signature ____________________________              Nurse Signature _____________________________

## 2013-07-14 NOTE — Progress Notes (Signed)
Need orders in EPIC.  Surgery scheduled for 07/20/13.  Preop appointment on 07/14/13 at 1100am.  Thank You.

## 2013-07-20 ENCOUNTER — Encounter (HOSPITAL_COMMUNITY): Payer: Self-pay | Admitting: *Deleted

## 2013-07-20 ENCOUNTER — Encounter (HOSPITAL_COMMUNITY)
Admission: RE | Disposition: A | Discharge status: Home / Self Care | Payer: Self-pay | Source: Ambulatory Visit | Attending: Surgery

## 2013-07-20 ENCOUNTER — Encounter (HOSPITAL_COMMUNITY): Payer: Self-pay | Admitting: Anesthesiology

## 2013-07-20 ENCOUNTER — Ambulatory Visit (HOSPITAL_COMMUNITY)
Admission: RE | Admit: 2013-07-20 | Discharge: 2013-07-20 | Disposition: A | Discharge status: Home / Self Care | Payer: Medicare Other | Source: Ambulatory Visit | Attending: Surgery | Admitting: Surgery

## 2013-07-20 ENCOUNTER — Ambulatory Visit (HOSPITAL_COMMUNITY): Payer: Medicare Other | Admitting: Anesthesiology

## 2013-07-20 ENCOUNTER — Ambulatory Visit (HOSPITAL_COMMUNITY): Payer: Medicare Other

## 2013-07-20 DIAGNOSIS — Z7982 Long term (current) use of aspirin: Secondary | ICD-10-CM | POA: Insufficient documentation

## 2013-07-20 DIAGNOSIS — K8010 Calculus of gallbladder with chronic cholecystitis without obstruction: Secondary | ICD-10-CM | POA: Insufficient documentation

## 2013-07-20 DIAGNOSIS — I714 Abdominal aortic aneurysm, without rupture, unspecified: Secondary | ICD-10-CM | POA: Insufficient documentation

## 2013-07-20 DIAGNOSIS — Z79899 Other long term (current) drug therapy: Secondary | ICD-10-CM | POA: Insufficient documentation

## 2013-07-20 DIAGNOSIS — K5900 Constipation, unspecified: Secondary | ICD-10-CM | POA: Insufficient documentation

## 2013-07-20 DIAGNOSIS — I10 Essential (primary) hypertension: Secondary | ICD-10-CM | POA: Insufficient documentation

## 2013-07-20 DIAGNOSIS — K219 Gastro-esophageal reflux disease without esophagitis: Secondary | ICD-10-CM | POA: Insufficient documentation

## 2013-07-20 DIAGNOSIS — E785 Hyperlipidemia, unspecified: Secondary | ICD-10-CM | POA: Insufficient documentation

## 2013-07-20 HISTORY — PX: CHOLECYSTECTOMY: SHX55

## 2013-07-20 SURGERY — LAPAROSCOPIC CHOLECYSTECTOMY WITH INTRAOPERATIVE CHOLANGIOGRAM
Anesthesia: General | Wound class: Contaminated

## 2013-07-20 MED ORDER — DIPHENHYDRAMINE HCL 50 MG/ML IJ SOLN
INTRAMUSCULAR | Status: DC | PRN
  Administered 2013-07-20: 25 mg via INTRAVENOUS

## 2013-07-20 MED ORDER — SODIUM CHLORIDE 0.9 % IJ SOLN
INTRAMUSCULAR | Status: DC | PRN
  Administered 2013-07-20: 14:00:00

## 2013-07-20 MED ORDER — OXYCODONE-ACETAMINOPHEN 5-325 MG PO TABS: 1.0000 | ORAL_TABLET | ORAL | Status: DC | PRN

## 2013-07-20 MED ORDER — SUCCINYLCHOLINE CHLORIDE 20 MG/ML IJ SOLN
INTRAMUSCULAR | Status: DC | PRN
  Administered 2013-07-20: 100 mg via INTRAVENOUS

## 2013-07-20 MED ORDER — LACTATED RINGERS IV SOLN: INTRAVENOUS | Status: DC | PRN

## 2013-07-20 MED ORDER — PHENYLEPHRINE HCL 10 MG/ML IJ SOLN
INTRAMUSCULAR | Status: DC | PRN
  Administered 2013-07-20 (×4): 40 ug via INTRAVENOUS

## 2013-07-20 MED ORDER — CIPROFLOXACIN IN D5W 400 MG/200ML IV SOLN
400.0000 mg | INTRAVENOUS | Status: DC
  Administered 2013-07-20: 25 mg via INTRAVENOUS

## 2013-07-20 MED ORDER — MIDAZOLAM HCL 5 MG/5ML IJ SOLN
INTRAMUSCULAR | Status: DC | PRN
  Administered 2013-07-20: 1 mg via INTRAVENOUS

## 2013-07-20 MED ORDER — BUPIVACAINE-EPINEPHRINE PF 0.25-1:200000 % IJ SOLN
INTRAMUSCULAR | Status: AC
  Filled 2013-07-20: qty 30

## 2013-07-20 MED ORDER — CISATRACURIUM BESYLATE (PF) 10 MG/5ML IV SOLN
INTRAVENOUS | Status: DC | PRN
  Administered 2013-07-20: 6 mg via INTRAVENOUS
  Administered 2013-07-20: 2 mg via INTRAVENOUS

## 2013-07-20 MED ORDER — GLYCOPYRROLATE 0.2 MG/ML IJ SOLN
INTRAMUSCULAR | Status: DC | PRN
  Administered 2013-07-20: .1 mg via INTRAVENOUS
  Administered 2013-07-20: 0.1 mg via INTRAVENOUS
  Administered 2013-07-20: .4 mg via INTRAVENOUS

## 2013-07-20 MED ORDER — LACTATED RINGERS IR SOLN
Status: DC | PRN
  Administered 2013-07-20: 1

## 2013-07-20 MED ORDER — CLINDAMYCIN PHOSPHATE 300 MG/50ML IV SOLN
300.0000 mg | Freq: Once | INTRAVENOUS | Status: DC
  Filled 2013-07-20: qty 50

## 2013-07-20 MED ORDER — OXYCODONE HCL 5 MG PO TABS: 5.0000 mg | ORAL_TABLET | ORAL | Status: DC | PRN

## 2013-07-20 MED ORDER — 0.9 % SODIUM CHLORIDE (POUR BTL) OPTIME
TOPICAL | Status: DC | PRN
  Administered 2013-07-20: 1000 mL

## 2013-07-20 MED ORDER — LACTATED RINGERS IV SOLN
INTRAVENOUS | Status: DC
  Administered 2013-07-20: 15:00:00 via INTRAVENOUS
  Administered 2013-07-20: 1000 mL via INTRAVENOUS
  Administered 2013-07-20: 13:00:00 via INTRAVENOUS

## 2013-07-20 MED ORDER — IOHEXOL 300 MG/ML  SOLN: INTRAMUSCULAR | Status: DC | PRN

## 2013-07-20 MED ORDER — CLINDAMYCIN PHOSPHATE 900 MG/50ML IV SOLN
INTRAVENOUS | Status: DC | PRN
  Administered 2013-07-20: 300 mg via INTRAVENOUS

## 2013-07-20 MED ORDER — BUPIVACAINE-EPINEPHRINE 0.25% -1:200000 IJ SOLN
INTRAMUSCULAR | Status: DC | PRN
  Administered 2013-07-20: 7 mL

## 2013-07-20 MED ORDER — FENTANYL CITRATE 0.05 MG/ML IJ SOLN
INTRAMUSCULAR | Status: DC | PRN
  Administered 2013-07-20: 100 ug via INTRAVENOUS
  Administered 2013-07-20 (×2): 50 ug via INTRAVENOUS

## 2013-07-20 MED ORDER — NEOSTIGMINE METHYLSULFATE 1 MG/ML IJ SOLN
INTRAMUSCULAR | Status: DC | PRN
  Administered 2013-07-20: 3 mg via INTRAVENOUS

## 2013-07-20 MED ORDER — ONDANSETRON HCL 4 MG/2ML IJ SOLN
INTRAMUSCULAR | Status: DC | PRN
  Administered 2013-07-20: 2 mg via INTRAVENOUS

## 2013-07-20 MED ORDER — EPHEDRINE SULFATE 50 MG/ML IJ SOLN
INTRAMUSCULAR | Status: DC | PRN
  Administered 2013-07-20: 10 mg via INTRAVENOUS
  Administered 2013-07-20: 5 mg via INTRAVENOUS
  Administered 2013-07-20 (×2): 10 mg via INTRAVENOUS

## 2013-07-20 MED ORDER — DEXAMETHASONE SODIUM PHOSPHATE 10 MG/ML IJ SOLN
INTRAMUSCULAR | Status: DC | PRN
  Administered 2013-07-20: 10 mg via INTRAVENOUS

## 2013-07-20 MED ORDER — CIPROFLOXACIN IN D5W 400 MG/200ML IV SOLN
INTRAVENOUS | Status: AC
  Filled 2013-07-20: qty 200

## 2013-07-20 MED ORDER — PROPOFOL 10 MG/ML IV BOLUS
INTRAVENOUS | Status: DC | PRN
  Administered 2013-07-20: 175 mg via INTRAVENOUS

## 2013-07-20 MED ORDER — HYDROMORPHONE HCL PF 1 MG/ML IJ SOLN: 0.2500 mg | INTRAMUSCULAR | Status: DC | PRN

## 2013-07-20 MED ORDER — LIDOCAINE HCL (CARDIAC) 20 MG/ML IV SOLN: INTRAVENOUS | Status: DC | PRN

## 2013-07-20 MED ORDER — LIDOCAINE HCL (CARDIAC) 20 MG/ML IV SOLN
INTRAVENOUS | Status: DC | PRN
  Administered 2013-07-20: 75 mg via INTRAVENOUS

## 2013-07-20 MED ORDER — LACTATED RINGERS IV SOLN: INTRAVENOUS | Status: DC

## 2013-07-20 SURGICAL SUPPLY — 38 items
ADH SKN CLS APL DERMABOND .7 (GAUZE/BANDAGES/DRESSINGS)
APPLIER CLIP ROT 10 11.4 M/L (STAPLE) ×2
APR CLP MED LRG 11.4X10 (STAPLE) ×1
BAG SPEC RTRVL LRG 6X4 10 (ENDOMECHANICALS) ×1
CANISTER SUCTION 2500CC (MISCELLANEOUS) ×2 IMPLANT
CHLORAPREP W/TINT 26ML (MISCELLANEOUS) ×2 IMPLANT
CLIP APPLIE ROT 10 11.4 M/L (STAPLE) ×1 IMPLANT
CLOTH BEACON ORANGE TIMEOUT ST (SAFETY) ×2 IMPLANT
COVER MAYO STAND STRL (DRAPES) ×2 IMPLANT
DECANTER SPIKE VIAL GLASS SM (MISCELLANEOUS) ×2 IMPLANT
DERMABOND ADVANCED (GAUZE/BANDAGES/DRESSINGS)
DERMABOND ADVANCED .7 DNX12 (GAUZE/BANDAGES/DRESSINGS) IMPLANT
DRAPE C-ARM 42X120 X-RAY (DRAPES) ×2 IMPLANT
DRAPE LAPAROSCOPIC ABDOMINAL (DRAPES) ×2 IMPLANT
DRAPE UTILITY XL STRL (DRAPES) ×2 IMPLANT
DRAPE WARM FLUID 44X44 (DRAPE) ×1 IMPLANT
ELECT REM PT RETURN 9FT ADLT (ELECTROSURGICAL) ×2
ELECTRODE REM PT RTRN 9FT ADLT (ELECTROSURGICAL) ×1 IMPLANT
GLOVE INDICATOR 8.0 STRL GRN (GLOVE) ×2 IMPLANT
GLOVE SS BIOGEL STRL SZ 8 (GLOVE) ×1 IMPLANT
GLOVE SUPERSENSE BIOGEL SZ 8 (GLOVE) ×1
GOWN STRL REIN XL XLG (GOWN DISPOSABLE) ×4 IMPLANT
HEMOSTAT SNOW SURGICEL 2X4 (HEMOSTASIS) ×2 IMPLANT
HEMOSTAT SURGICEL 4X8 (HEMOSTASIS) ×1 IMPLANT
KIT BASIN OR (CUSTOM PROCEDURE TRAY) ×2 IMPLANT
POUCH SPECIMEN RETRIEVAL 10MM (ENDOMECHANICALS) ×2 IMPLANT
SCISSORS LAP 5X35 DISP (ENDOMECHANICALS) IMPLANT
SET CHOLANGIOGRAPH MIX (MISCELLANEOUS) ×2 IMPLANT
SET IRRIG TUBING LAPAROSCOPIC (IRRIGATION / IRRIGATOR) ×2 IMPLANT
SLEEVE XCEL OPT CAN 5 100 (ENDOMECHANICALS) ×2 IMPLANT
SUT MNCRL AB 4-0 PS2 18 (SUTURE) ×2 IMPLANT
TOWEL OR 17X26 10 PK STRL BLUE (TOWEL DISPOSABLE) ×2 IMPLANT
TOWEL OR NON WOVEN STRL DISP B (DISPOSABLE) ×2 IMPLANT
TRAY LAP CHOLE (CUSTOM PROCEDURE TRAY) ×2 IMPLANT
TROCAR BLADELESS OPT 5 100 (ENDOMECHANICALS) ×2 IMPLANT
TROCAR XCEL BLUNT TIP 100MML (ENDOMECHANICALS) ×2 IMPLANT
TROCAR XCEL NON-BLD 11X100MML (ENDOMECHANICALS) ×2 IMPLANT
TUBING INSUFFLATION 10FT LAP (TUBING) ×2 IMPLANT

## 2013-07-20 NOTE — Transfer of Care (Signed)
Immediate Anesthesia Transfer of Care Note  Patient: Johnny Kane  Procedure(s) Performed: Procedure(s): LAPAROSCOPIC CHOLECYSTECTOMY (N/A)  Patient Location: PACU  Anesthesia Type:General  Level of Consciousness: awake, oriented, patient cooperative, lethargic and responds to stimulation  Airway & Oxygen Therapy: Patient Spontanous Breathing and Patient connected to face mask oxygen  Post-op Assessment: Report given to PACU RN, Post -op Vital signs reviewed and stable and Patient moving all extremities  Post vital signs: Reviewed and stable  Complications: No apparent anesthesia complications

## 2013-07-20 NOTE — H&P (View-Only) (Signed)
Patient ID: Johnny Kane, male   DOB: [birth year], 72 y.o.   MRN: [id number]  Chief Complaint  Patient presents with  . New Evaluation    eval GB    HPI Johnny Kane is a 72 y.o. male.  Patient sent at the request of Dr. Nathanial Rancher for epigastric abdominal pain. This is been present for at least a year. Pain medications epigastrium and right upper quadrant of his abdomen. There is some radiation into his lower chest. He has heartburn. It is made worse with fatty greasy foods. His diet is been restricted to low fat. The discomfort is becoming more frequent. Episodes last minutes to hours. The pain is sharp in nature. He recently had an EGD which showed no ulcer. Ultrasound was done which showed multiple gallstones and thickened gallbladder wall. This was similar to ultrasound done in 2009 are similar symptoms. HPI  Past Medical History  Diagnosis Date  . AAA (abdominal aortic aneurysm)   . Hiatal hernia   . Diverticulosis of colon   . History of MRSA infection   . Hypertension   . Depression   . Hyperlipidemia   . OA (osteoarthritis)   . GERD (gastroesophageal reflux disease)   . DOE (dyspnea on exertion)   . Palpitations   . Vitamin B 12 deficiency     Past Surgical History  Procedure Laterality Date  . Replacement total knee      one on each knee  . US echocardiography  03/2007    EF 55-60%    Family History  Problem Relation Age of Onset  . Asthma Mother   . Hypertension Mother   . Heart attack Father     Social History History  Substance Use Topics  . Smoking status: Former Smoker    Quit date: 11/04/1967  . Smokeless tobacco: Never Used  . Alcohol Use: No    Allergies  Allergen Reactions  . Esomeprazole Magnesium   . Penicillins   . Soma [Carisoprodol]   . Sulfa Drugs Cross Reactors     Current Outpatient Prescriptions  Medication Sig Dispense Refill  . aspirin 81 MG tablet Take 81 mg by mouth daily.        Marland Kitchen BENICAR 40 MG tablet       . citalopram  (CELEXA) 20 MG tablet Take 20 mg by mouth daily.        . famotidine (PEPCID) 20 MG tablet       . modafinil (PROVIGIL) 200 MG tablet Take 200 mg by mouth daily.        . polyethylene glycol powder (MIRALAX) powder Take 17 g by mouth daily.      . pravastatin (PRAVACHOL) 40 MG tablet       . vitamin B-12 (CYANOCOBALAMIN) 1000 MCG tablet Take 1,000 mcg by mouth daily.        Marland Kitchen lidocaine (XYLOCAINE) 2 % solution Take 20 mLs by mouth as needed.         No current facility-administered medications for this visit.    Review of Systems Review of Systems  HENT: Positive for hearing loss.   Eyes: Negative.   Respiratory: Negative.   Cardiovascular: Negative.   Gastrointestinal: Positive for constipation.  Endocrine: Negative.   Genitourinary: Negative.   Musculoskeletal: Positive for arthralgias.  Allergic/Immunologic: Negative.   Neurological: Positive for weakness.  Psychiatric/Behavioral: Negative.     Blood pressure 140/78, pulse 80, temperature 97.4 F (36.3 C), temperature source Temporal, resp. rate 15, height 5\' 11"  (1.803  m), weight 237 lb 3.2 oz (107.593 kg).  Physical Exam Physical Exam  Constitutional: He is oriented to person, place, and time. He appears well-developed and well-nourished.  HENT:  Head: Normocephalic and atraumatic.  Eyes: EOM are normal. Pupils are equal, round, and reactive to light.  Neck: Normal range of motion. Neck supple.  Cardiovascular: Normal rate and regular rhythm.   Pulmonary/Chest: Effort normal and breath sounds normal.  Abdominal: Soft. Bowel sounds are normal. He exhibits no distension and no mass. There is no tenderness. There is no rebound and no guarding.  Musculoskeletal: Normal range of motion.  Neurological: He is alert and oriented to person, place, and time.  Skin: Skin is warm and dry.  Psychiatric: He has a normal mood and affect. His behavior is normal. Judgment and thought content normal.    Data Reviewed [facility]trasound from  [facility] shows gallstones and thickened gallbladder wall and nl CBD.    Assessment    Cholelithiasis and chronic cholecystitis    Plan    Recommend laparoscopic cholecystectomy with cholangiogram.The procedure has been discussed with the patient. Operative and non operative treatments have been discussed. Risks of surgery include bleeding, infection,  Common bile duct injury,  Injury to the stomach,liver, colon,small intestine, abdominal wall,  Diaphragm,  Major blood vessels,  And the need for an open procedure.  Other risks include worsening of medical problems, death,  DVT and pulmonary embolism, and cardiovascular events.   Medical options have also been discussed. The patient has been informed of long term expectations of surgery and non surgical options,  The patient agrees to proceed.   Discussed success rates of 80% at relieving his symptoms. He may have reflux on top of this as well as chronic constipation which can be contributing to his symptoms. He understands this and would like to proceed with surgery.       Johnny Kane A. 07/11/2013, 10:40 AM

## 2013-07-20 NOTE — Anesthesia Preprocedure Evaluation (Addendum)
Anesthesia Evaluation  Patient identified by MRN, date of birth, ID band Patient awake    Reviewed: Allergy & Precautions, H&P , NPO status , Patient's Chart, lab work & pertinent test results  Airway Mallampati: II TM Distance: >3 FB Neck ROM: full    Dental no notable dental hx. (+) Teeth Intact and Dental Advisory Given   Pulmonary shortness of breath and with exertion, sleep apnea ,  breath sounds clear to auscultation  Pulmonary exam normal       Cardiovascular hypertension, Pt. on medications Rhythm:regular Rate:Normal  AAA checked 2013.  palpitations   Neuro/Psych negative neurological ROS  negative psych ROS   GI/Hepatic negative GI ROS, Neg liver ROS, hiatal hernia, GERD-  Controlled and Medicated,  Endo/Other  negative endocrine ROS  Renal/GU negative Renal ROS  negative genitourinary   Musculoskeletal   Abdominal   Peds  Hematology negative hematology ROS (+)   Anesthesia Other Findings   Reproductive/Obstetrics negative OB ROS                          Anesthesia Physical Anesthesia Plan  ASA: III  Anesthesia Plan: General   Post-op Pain Management:    Induction: Intravenous  Airway Management Planned: Oral ETT  Additional Equipment:   Intra-op Plan:   Post-operative Plan: Extubation in OR  Informed Consent: I have reviewed the patients History and Physical, chart, labs and discussed the procedure including the risks, benefits and alternatives for the proposed anesthesia with the patient or authorized representative who has indicated his/her understanding and acceptance.   Dental Advisory Given  Plan Discussed with: CRNA and Surgeon  Anesthesia Plan Comments:         Anesthesia Quick Evaluation

## 2013-07-20 NOTE — Anesthesia Procedure Notes (Signed)
Procedure Name: Intubation Date/Time: 07/20/2013 1:01 PM Performed by: Edison Pace Pre-anesthesia Checklist: Patient identified, Timeout performed, Emergency Drugs available, Suction available and Patient being monitored Patient Re-evaluated:Patient Re-evaluated prior to inductionOxygen Delivery Method: Circle system utilized Preoxygenation: Pre-oxygenation with 100% oxygen Intubation Type: IV induction and Cricoid Pressure applied Ventilation: Mask ventilation without difficulty Laryngoscope Size: Mac and 4 Grade View: Grade II Tube type: Oral Tube size: 7.5 mm Number of attempts: 1 (anterior, small mouth opening, tight tmj. cords seen with head lift/cricoid press.) Airway Equipment and Method: Stylet Placement Confirmation: ETT inserted through vocal cords under direct vision,  positive ETCO2 and breath sounds checked- equal and bilateral Secured at: 21 cm Tube secured with: Tape Dental Injury: Teeth and Oropharynx as per pre-operative assessment  Difficulty Due To: Difficulty was anticipated, Difficult Airway- due to limited oral opening, Difficult Airway- due to reduced neck mobility, Difficult Airway- due to large tongue and Difficult Airway- due to anterior larynx Future Recommendations: Recommend- induction with short-acting agent, and alternative techniques readily available

## 2013-07-20 NOTE — Preoperative (Signed)
Beta Blockers   Reason not to administer Beta Blockers:Not Applicable 

## 2013-07-20 NOTE — Anesthesia Postprocedure Evaluation (Signed)
  Anesthesia Post-op Note  Patient: Johnny Kane  Procedure(s) Performed: Procedure(s) (LRB): LAPAROSCOPIC CHOLECYSTECTOMY (N/A)  Patient Location: PACU  Anesthesia Type: General  Level of Consciousness: awake and alert   Airway and Oxygen Therapy: Patient Spontanous Breathing  Post-op Pain: mild  Post-op Assessment: Post-op Vital signs reviewed, Patient's Cardiovascular Status Stable, Respiratory Function Stable, Patent Airway and No signs of Nausea or vomiting  Last Vitals:  Filed Vitals:   07/20/13 1515  BP:   Pulse:   Temp: 36.6 C  Resp:     Post-op Vital Signs: stable   Complications: No apparent anesthesia complications

## 2013-07-20 NOTE — Op Note (Signed)
Laparoscopic Cholecystectomy Procedure Note  Indications: This patient presents with symptomatic gallbladder disease and will undergo laparoscopic cholecystectomy.The procedure has been discussed with the patient. Operative and non operative treatments have been discussed. Risks of surgery include bleeding, infection,  Common bile duct injury,  Injury to the stomach,liver, colon,small intestine, abdominal wall,  Diaphragm,  Major blood vessels,  And the need for an open procedure.  Other risks include worsening of medical problems, death,  DVT and pulmonary embolism, and cardiovascular events.   Medical options have also been discussed. The patient has been informed of long term expectations of surgery and non surgical options,  The patient agrees to proceed.    Pre-operative Diagnosis: Calculus of gallbladder without mention of cholecystitis or obstruction  Post-operative Diagnosis: Chronic cholecystitis  Surgeon: Kanyon Seibold A.   Assistants: OR staff  Anesthesia: General endotracheal anesthesia and Local anesthesia 0.25.% bupivacaine, with epinephrine  ASA Class: 3  Procedure Details  The patient was seen again in the Holding Room. The risks, benefits, complications, treatment options, and expected outcomes were discussed with the patient. The possibilities of reaction to medication, pulmonary aspiration, perforation of viscus, bleeding, recurrent infection, finding a normal gallbladder, the need for additional procedures, failure to diagnose a condition, the possible need to convert to an open procedure, and creating a complication requiring transfusion or operation were discussed with the patient. The patient and/or family concurred with the proposed plan, giving informed consent. The site of surgery properly noted/marked. The patient was taken to Operating Room, identified as Johnny Kane and the procedure verified as Laparoscopic Cholecystectomy with Intraoperative Cholangiograms. A Time  Out was held and the above information confirmed.  Prior to the induction of general anesthesia, antibiotic prophylaxis was administered. General endotracheal anesthesia was then administered and tolerated well. After the induction, the abdomen was prepped in the usual sterile fashion. The patient was positioned in the supine position with the left arm comfortably tucked, along with some reverse Trendelenburg.  Local anesthetic agent was injected into the skin near the umbilicus and an incision made. The midline fascia was incised and the Hasson technique was used to introduce a 12 mm port under direct vision. It was secured with a figure of eight Vicryl suture placed in the usual fashion. Pneumoperitoneum was then created with CO2 and tolerated well without any adverse changes in the patient's vital signs. Additional trocars were introduced under direct vision with an 11 mm trocar in the epigastrium and 2 5 mm trocars in the right upper quadrant. All skin incisions were infiltrated with a local anesthetic agent before making the incision and placing the trocars.   The gallbladder was identified, the fundus grasped and retracted cephalad. Adhesions were lysed bluntly and with the electrocautery where indicated, taking care not to injure any adjacent organs or viscus. The infundibulum was grasped and retracted laterally, exposing the peritoneum overlying the triangle of Calot. This was then divided and exposed in a blunt fashion. The cystic duct was clearly identified and bluntly dissected circumferentially. The junctions of the gallbladder, cystic duct and common bile duct were clearly identified prior to the division of any linear structure.  The critical view was obtained and cholangiogram was not performed.   The cystic duct was then  ligated with surgical clips  on the patient side and  clipped on the gallbladder side and divided. The cystic artery was identified, dissected free, ligated with clips and  divided as well. Posterior cystic artery clipped and divided.  The gallbladder was  dissected from the liver bed in retrograde fashion with the electrocautery.  It was intrahepatic. It had changes of chronic cholecystitis. The gallbladder was removed. The liver bed was irrigated and inspected. Hemostasis was achieved with the electrocautery and Surgicel. Copious irrigation was utilized and was repeatedly aspirated until clear all particulate matter. Hemostasis was achieved with no signs  Of bleeding or bile leakage. Gallbladder placed into Endocatch bag and extracted via umbilicus.   Pneumoperitoneum was completely reduced after viewing removal of the trocars under direct vision. The wound was thoroughly irrigated and the fascia was then closed with a figure of eight suture; the skin was then closed with 4 O monocryl and a sterile dressing was applied.  Instrument, sponge, and needle counts were correct at closure and at the conclusion of the case.   Findings: Cholecystitis ( chronic)  with Cholelithiasis  Estimated Blood Loss: less than 100 mL                 Total IV Fluids: 1200 mL         Specimens: Gallbladder           Complications: None; patient tolerated the procedure well.         Disposition: PACU - hemodynamically stable.         Condition: stable

## 2013-07-20 NOTE — Interval H&P Note (Signed)
History and Physical Interval Note:  07/20/2013 12:28 PM  Johnny Kane  has presented today for surgery, with the diagnosis of gall stones   The various methods of treatment have been discussed with the patient and family. After consideration of risks, benefits and other options for treatment, the patient has consented to  Procedure(s): LAPAROSCOPIC CHOLECYSTECTOMY WITH INTRAOPERATIVE CHOLANGIOGRAM (N/A) as a surgical intervention .  The patient's history has been reviewed, patient examined, no change in status, stable for surgery.  I have reviewed the patient's chart and labs.  Questions were answered to the patient's satisfaction.     [name] A.

## 2013-07-22 ENCOUNTER — Encounter (HOSPITAL_COMMUNITY): Payer: Self-pay | Admitting: Surgery

## 2013-07-27 ENCOUNTER — Other Ambulatory Visit (INDEPENDENT_AMBULATORY_CARE_PROVIDER_SITE_OTHER): Payer: Self-pay

## 2013-07-27 ENCOUNTER — Telehealth (INDEPENDENT_AMBULATORY_CARE_PROVIDER_SITE_OTHER): Payer: Self-pay

## 2013-07-27 DIAGNOSIS — K811 Chronic cholecystitis: Secondary | ICD-10-CM

## 2013-07-27 LAB — CBC WITH DIFFERENTIAL/PLATELET
Basophils Relative: 0 % (ref 0–1)
Eosinophils Absolute: 0.1 10*3/uL (ref 0.0–0.7)
Eosinophils Relative: 1 % (ref 0–5)
Hemoglobin: 13.5 g/dL (ref 13.0–17.0)
Lymphs Abs: 1.5 10*3/uL (ref 0.7–4.0)
MCH: 30.3 pg (ref 26.0–34.0)
MCHC: 34.8 g/dL (ref 30.0–36.0)
MCV: 87 fL (ref 78.0–100.0)
Monocytes Relative: 9 % (ref 3–12)
Neutrophils Relative %: 75 % (ref 43–77)

## 2013-07-27 LAB — COMPREHENSIVE METABOLIC PANEL
Alkaline Phosphatase: 66 U/L (ref 39–117)
BUN: 19 mg/dL (ref 6–23)
CO2: 31 mEq/L (ref 19–32)
Creat: 1.1 mg/dL (ref 0.50–1.35)
Glucose, Bld: 105 mg/dL — ABNORMAL HIGH (ref 70–99)
Sodium: 145 mEq/L (ref 135–145)
Total Bilirubin: 0.5 mg/dL (ref 0.3–1.2)
Total Protein: 7 g/dL (ref 6.0–8.3)

## 2013-07-27 NOTE — Telephone Encounter (Signed)
The pt called s/p lap chole on 9/17.  He is complaining of fevers for the past 4 days of 101 max.  He gets hot and has chills.  He is eating ok, moving his bowels and urinating.  He states his incisions are just slightly puffy and red.  The redness does not spread out around the skin.  Dr Luisa Hart is unavailable so I spoke to Dr Jamey Ripa.  He advised to have him be seen tomorrow by Dr Luisa Hart and have a CBC and CMET prior.  I scheduled an appointment for 9/25 at 1:30 and will have him go to [facility] this afternoon.

## 2013-07-28 ENCOUNTER — Encounter (INDEPENDENT_AMBULATORY_CARE_PROVIDER_SITE_OTHER): Payer: Self-pay | Admitting: Surgery

## 2013-07-28 ENCOUNTER — Ambulatory Visit (INDEPENDENT_AMBULATORY_CARE_PROVIDER_SITE_OTHER): Payer: Medicare Other | Admitting: Surgery

## 2013-07-28 VITALS — BP 164/76 | HR 84 | Temp 98.9°F | Resp 20 | Ht 71.0 in | Wt 231.4 lb

## 2013-07-28 DIAGNOSIS — Z9889 Other specified postprocedural states: Secondary | ICD-10-CM

## 2013-07-28 MED ORDER — CLINDAMYCIN HCL 300 MG PO CAPS: 300.0000 mg | ORAL_CAPSULE | Freq: Three times a day (TID) | ORAL | Status: AC

## 2013-07-28 NOTE — Progress Notes (Signed)
Patient returns one week after laparoscopic cholecystectomy. He had any fevers to as high as 101.7. He is not feel bad. No significant abdominal pain. No burning when he urinates. No cough sputum. He does have prosthetic knees and was told to be checked out a time he has a fever.  Abdomen: Soft and nontender. Minimal erythema at the superior port site. Other sites look fine.  CBC    Component Value Date/Time   WBC 10.0 07/27/2013 1646   RBC 4.46 07/27/2013 1646   HGB 13.5 07/27/2013 1646   HCT 38.8* 07/27/2013 1646   PLT 192 07/27/2013 1646   MCV 87.0 07/27/2013 1646   MCH 30.3 07/27/2013 1646   MCHC 34.8 07/27/2013 1646   RDW 14.0 07/27/2013 1646   LYMPHSABS 1.5 07/27/2013 1646   MONOABS 0.9 07/27/2013 1646   EOSABS 0.1 07/27/2013 1646   BASOSABS 0.0 07/27/2013 1646   CMP     Component Value Date/Time   NA 145 07/27/2013 1644   K 5.0 07/27/2013 1644   CL 105 07/27/2013 1644   CO2 31 07/27/2013 1644   GLUCOSE 105* 07/27/2013 1644   BUN 19 07/27/2013 1644   CREATININE 1.10 07/27/2013 1644   CREATININE 1.06 07/14/2013 1200   CALCIUM 10.0 07/27/2013 1644   PROT 7.0 07/27/2013 1644   ALBUMIN 4.1 07/27/2013 1644   AST 17 07/27/2013 1644   ALT 24 07/27/2013 1644   ALKPHOS 66 07/27/2013 1644   BILITOT 0.5 07/27/2013 1644   GFRNONAA 67* 07/14/2013 1200   GFRAA 78* 07/14/2013 1200     Impression: History of postoperative fever without clinical findings to explain this  Plan: Given his lack of symptoms I feel this will resolve on its own. We'll place him on clindamycin 3 mg by mouth 3 times a day prophylactically given his implants. Return to clinic if he develops abdominal pain, nausea, or fever persists.

## 2013-07-28 NOTE — Patient Instructions (Signed)
Will place you on clindamycin 300 mg three times a day for 10 days. Keep follow up appt.

## 2013-08-08 ENCOUNTER — Ambulatory Visit (INDEPENDENT_AMBULATORY_CARE_PROVIDER_SITE_OTHER): Payer: Medicare Other | Admitting: Surgery

## 2013-08-08 ENCOUNTER — Encounter (INDEPENDENT_AMBULATORY_CARE_PROVIDER_SITE_OTHER): Payer: Self-pay | Admitting: Surgery

## 2013-08-08 VITALS — BP 104/60 | HR 72 | Temp 97.0°F | Resp 16 | Ht 71.0 in | Wt 224.0 lb

## 2013-08-08 DIAGNOSIS — Z9889 Other specified postprocedural states: Secondary | ICD-10-CM

## 2013-08-08 NOTE — Patient Instructions (Signed)
Return to full activity. Call with questions. Finish antibiotics.

## 2013-08-08 NOTE — Progress Notes (Signed)
Patient returns in followup after laparoscopic cholecystectomy. He had some low-grade fevers postoperatively but these have resolved. He feels tired his appetite is somewhat depressed. He is discussing with his primary care doctor adjusting his present medication.  Exam: Wounds clean dry and intact. Soft abdomen without rebound or guarding.  Impression: Status post laparoscopic cholecystectomy cholangiogram postoperative fever of unknown etiology which has resolved  Plan: Patient is improved. He will follow up with primary care for a medication adjustment. I reassured him his appetite will improve with time. Return as needed. Resume full activity.

## 2013-11-15 ENCOUNTER — Encounter: Payer: Self-pay | Admitting: Internal Medicine

## 2013-11-15 ENCOUNTER — Ambulatory Visit (INDEPENDENT_AMBULATORY_CARE_PROVIDER_SITE_OTHER): Payer: Medicare Other | Admitting: Internal Medicine

## 2013-11-15 ENCOUNTER — Encounter: Payer: Self-pay | Admitting: *Deleted

## 2013-11-15 VITALS — BP 150/92 | HR 81 | Ht 73.0 in | Wt 236.5 lb

## 2013-11-15 DIAGNOSIS — R079 Chest pain, unspecified: Secondary | ICD-10-CM

## 2013-11-15 MED ORDER — FUROSEMIDE 20 MG PO TABS: 20.0000 mg | ORAL_TABLET | ORAL | Status: DC

## 2013-11-15 NOTE — Patient Instructions (Addendum)
Your physician has recommended you make the following change in your medication:  Start Lasix 20 mg daily for 3 days then every other day   Your physician has requested that you have a cardiac catheterization on 11/25/13 at 07:30 am. Please arrive at 05:30 am. Cardiac catheterization is used to diagnose and/or treat various heart conditions. Doctors may recommend this procedure for a number of different reasons. The most common reason is to evaluate chest pain. Chest pain can be a symptom of coronary artery disease (CAD), and cardiac catheterization can show whether plaque is narrowing or blocking your heart's arteries. This procedure is also used to evaluate the valves, as well as measure the blood flow and oxygen levels in different parts of your heart. For further information please visit [email or web address]. Please follow instruction sheet, as given.   Dr. Caryl Comes wants to have a LINQ Loop Monitor placed on the same day as your cardiac cath at 11 am  [facility]   Labs today: Cbc  bmet  PT/INR  [employer] informed of procedure date and time.

## 2013-11-15 NOTE — Progress Notes (Signed)
ELECTROPHYSIOLOGY CONSULT NOTE  Patient ID: Johnny Kane, MRN: [id number], DOB/AGE: Nov 28, 1940 73 y.o. Admit date: (Not on file) Date of Consult: 11/15/2013  Primary Physician: Leonides Sake, MD Primary Cardiologist:  Chief Complaint:    HPI Johnny Kane is a 73 y.o. male is seen with complaints of chest pain, episodic bradycardia, dyspnea on exertion in the context of remote vasovagal syncope and temporally related to the recent tragic death of his great-grandson  Past Medical History  Diagnosis Date  . Hiatal hernia   . Diverticulosis of colon   . History of MRSA infection   . Hypertension   . Depression   . Hyperlipidemia   . OA (osteoarthritis)   . GERD (gastroesophageal reflux disease)   . DOE (dyspnea on exertion)   . Palpitations   . Vitamin B 12 deficiency   . Dysrhythmia     skips a beat   . Sleep apnea     mild not on cpap   . Heart murmur     as a child no problems since   . AAA (abdominal aortic aneurysm)     checked 2013 not seen   . Low testosterone   . Diabetes mellitus without complication   . Vasovagal episode       Surgical History:  Past Surgical History  Procedure Laterality Date  . Replacement total knee      one on each knee  . US echocardiography  03/2007    EF 55-60%  . Tonsillectomy    . Cholecystectomy N/A 07/20/2013    Procedure: LAPAROSCOPIC CHOLECYSTECTOMY;  Surgeon: Joyice Faster. Cornett, MD;  Location: [facility];  Service: General;  Laterality: N/A;     Home Meds: Prior to Admission medications   Medication Sig Start Date End Date Taking? Authorizing Provider  aspirin 81 MG tablet Take 81 mg by mouth daily.     Yes Historical Provider, MD  BENICAR 40 MG tablet Take 40 mg by mouth every evening.  06/11/13  Yes Historical Provider, MD  citalopram (CELEXA) 40 MG tablet Take 40 mg by mouth daily.   Yes Historical Provider, MD  clindamycin (CLEOCIN) 300 MG capsule Take 300 mg by mouth as needed.  07/28/13  Yes Historical Provider, MD    famotidine (PEPCID) 20 MG tablet Take 20 mg by mouth 2 (two) times daily.  06/04/13  Yes Historical Provider, MD  Omega-3 Fatty Acids (FISH OIL) 1200 MG CAPS Take by mouth daily.   Yes Historical Provider, MD  pravastatin (PRAVACHOL) 40 MG tablet Take 40 mg by mouth every evening.  07/08/13  Yes Historical Provider, MD      Allergies:  Allergies  Allergen Reactions  . Ciprofloxacin Hives and Itching  . Esomeprazole Magnesium     unknown  . Penicillins     unknown  . Soma [Carisoprodol]     unknown  . Sulfa Drugs Cross Reactors     unknown    History   Social History  . Marital Status: Married    Spouse Name: N/A    Number of Children: 2  . Years of Education: N/A   Occupational History  . [employer]     retired   Social History Main Topics  . Smoking status: Former Smoker -- 11 years    Quit date: 11/04/1967  . Smokeless tobacco: Never Used  . Alcohol Use: No  . Drug Use: No  . Sexual Activity: Not on file   Other Topics Concern  . Not on  file   Social History Narrative  . No narrative on file     Family History  Problem Relation Age of Onset  . Asthma Mother   . Hypertension Mother   . Heart attack Father      ROS:  Please see the history of present illness.     All other systems reviewed and negative.    Physical Exam:   Blood pressure 150/92, pulse 81, height 6\' 1"  (1.854 m), weight 236 lb 8 oz (107.276 kg). General: Well developed, well nourished male in no acute distress. Head: Normocephalic, atraumatic, sclera non-icteric, no xanthomas, nares are without discharge. EENT: normal Lymph Nodes:  none Back: without scoliosis/kyphosis *, no CVA tendersness Neck: Negative for carotid bruits. JVD not elevated. Lungs: Clear bilaterally to auscultation without wheezes, rales, or rhonchi. Breathing is unlabored. Heart: RRR with S1 S2. No   2/6 systolic  murmur , rubs, or gallops appreciated. Abdomen: Soft, non-tender, non-distended with normoactive bowel  sounds. No hepatomegaly. No rebound/guarding. No obvious abdominal masses. Msk:  Strength and tone appear normal for age. Extremities: No clubbing or cyanosis. 2 edema.  Distal pedal pulses are 2+ and equal bilaterally. Skin: Warm and Dry Neuro: Alert and oriented X 3. CN III-XII intact Grossly normal sensory and motor function . Psych:  Responds to questions appropriately with a normal affect.      Labs: Cardiac Enzymes No results found for this basename: CKTOTAL, CKMB, TROPONINI,  in the last 72 hours CBC Lab Results  Component Value Date   WBC 10.0 07/27/2013   HGB 13.5 07/27/2013   HCT 38.8* 07/27/2013   MCV 87.0 07/27/2013   PLT 192 07/27/2013   PROTIME: No results found for this basename: LABPROT, INR,  in the last 72 hours Chemistry No results found for this basename: NA, K, CL, CO2, BUN, CREATININE, CALCIUM, LABALBU, PROT, BILITOT, ALKPHOS, ALT, AST, GLUCOSE,  in the last 168 hours Lipids No results found for this basename: CHOL, HDL, LDLCALC, TRIG   BNP No results found for this basename: probnp   Miscellaneous No results found for this basename: DDIMER    Radiology/Studies:  No results found.  EKG:     Assessment and Plan:   Symptomatic bradycardia  Exertional chest pain  Dyspnea on exertion-HFpEF   Obesity  Sleep disturbance-unspecified  Syncope-vasovagal  Recent loss of a great-grandson  The patient has episodic bradycardia which is associated with significant symptoms. It is comes in clusters and has not happened recently but had recurrently. We will plan to proceed with implantable loop recorder  He has had chest discomfort over the years and has had more of late. He had a stress test couple of years ago that was unrevealing. Given the typical symptoms and a frequent negative stress test, will undertake catheterization  He has evidence of volume overload. We will begin him on a diuretic. I suspect this contributes to his exercise intolerance may be  part of his chest pain syndrome also.    Virl Axe

## 2013-11-16 ENCOUNTER — Encounter (HOSPITAL_COMMUNITY): Payer: Self-pay | Admitting: Respiratory Therapy

## 2013-11-16 LAB — BASIC METABOLIC PANEL
BUN / CREAT RATIO: 17 (ref 10–22)
BUN: 18 mg/dL (ref 8–27)
CHLORIDE: 100 mmol/L (ref 97–108)
CO2: 26 mmol/L (ref 18–29)
CREATININE: 1.03 mg/dL (ref 0.76–1.27)
Calcium: 10.1 mg/dL (ref 8.6–10.2)
GFR calc Af Amer: 82 mL/min/{1.73_m2} (ref >59)
GFR calc non Af Amer: 71 mL/min/{1.73_m2} (ref >59)
Glucose: 104 mg/dL — ABNORMAL HIGH (ref 65–99)
Potassium: 4.9 mmol/L (ref 3.5–5.2)
SODIUM: 142 mmol/L (ref 134–144)

## 2013-11-16 LAB — CBC WITH DIFFERENTIAL
BASOS ABS: 0 10*3/uL (ref 0.0–0.2)
Basos: 0 %
Eos: 3 %
Eosinophils Absolute: 0.2 10*3/uL (ref 0.0–0.4)
HCT: 42.8 % (ref 37.5–51.0)
Hemoglobin: 14.4 g/dL (ref 12.6–17.7)
IMMATURE GRANULOCYTES: 0 %
Immature Grans (Abs): 0 10*3/uL (ref 0.0–0.1)
LYMPHS ABS: 1.9 10*3/uL (ref 0.7–3.1)
Lymphs: 26 %
MCH: 29 pg (ref 26.6–33.0)
MCHC: 33.6 g/dL (ref 31.5–35.7)
MCV: 86 fL (ref 79–97)
MONOS ABS: 0.5 10*3/uL (ref 0.1–0.9)
Monocytes: 7 %
Neutrophils Absolute: 4.5 10*3/uL (ref 1.4–7.0)
Neutrophils Relative %: 64 %
Platelets: 172 10*3/uL (ref 150–379)
RBC: 4.96 x10E6/uL (ref 4.14–5.80)
RDW: 14.1 % (ref 12.3–15.4)
WBC: 7.2 10*3/uL (ref 3.4–10.8)

## 2013-11-16 LAB — PROTIME-INR
INR: 1.1 (ref 0.8–1.2)
Prothrombin Time: 10.7 s (ref 9.1–12.0)

## 2013-11-21 ENCOUNTER — Telehealth: Payer: Self-pay | Admitting: *Deleted

## 2013-11-21 NOTE — Telephone Encounter (Signed)
Patient scheduled for a cath Friday and has not had chest xray, would like to change and have it done in [place] as opposed to [place]. Would like to speak with RN regarding where to go to have done this week.

## 2013-11-22 ENCOUNTER — Ambulatory Visit: Payer: Self-pay | Admitting: Internal Medicine

## 2013-11-22 NOTE — Telephone Encounter (Signed)
Informred patient that his cxr order is at the front desk. Please come pick up and take to [facility] for chest x ray. Patient verbalized understanding.

## 2013-11-23 ENCOUNTER — Other Ambulatory Visit: Payer: Self-pay

## 2013-11-23 DIAGNOSIS — R079 Chest pain, unspecified: Secondary | ICD-10-CM

## 2013-11-23 NOTE — Telephone Encounter (Signed)
nolt a problem thx steve

## 2013-11-24 ENCOUNTER — Telehealth: Payer: Self-pay | Admitting: Cardiology

## 2013-11-24 NOTE — Telephone Encounter (Signed)
Contacted [facility] scheduling and confirmed they have cath and linq orders.

## 2013-11-24 NOTE — Telephone Encounter (Signed)
Returned call. Left message to call back.

## 2013-11-24 NOTE — Telephone Encounter (Signed)
Spoke with Musician. Informed her that orders are in.

## 2013-11-24 NOTE — Telephone Encounter (Signed)
Need orders for cath.  Pt coming in tomorrow.

## 2013-11-24 NOTE — Telephone Encounter (Signed)
Please call concerning your message she just received.

## 2013-11-25 ENCOUNTER — Encounter (HOSPITAL_COMMUNITY)
Admission: RE | Disposition: A | Discharge status: Home / Self Care | Payer: Medicare Other | Source: Ambulatory Visit | Attending: Cardiology

## 2013-11-25 ENCOUNTER — Ambulatory Visit (HOSPITAL_COMMUNITY)
Admission: RE | Admit: 2013-11-25 | Discharge: 2013-11-26 | Disposition: A | Discharge status: Home / Self Care | Payer: Medicare Other | Source: Ambulatory Visit | Attending: Cardiology | Admitting: Cardiology

## 2013-11-25 ENCOUNTER — Encounter (HOSPITAL_COMMUNITY): Payer: Self-pay | Admitting: General Practice

## 2013-11-25 ENCOUNTER — Encounter (HOSPITAL_COMMUNITY)
Admission: RE | Disposition: A | Discharge status: Home / Self Care | Payer: Self-pay | Source: Ambulatory Visit | Attending: Cardiology

## 2013-11-25 DIAGNOSIS — K449 Diaphragmatic hernia without obstruction or gangrene: Secondary | ICD-10-CM | POA: Insufficient documentation

## 2013-11-25 DIAGNOSIS — I714 Abdominal aortic aneurysm, without rupture, unspecified: Secondary | ICD-10-CM

## 2013-11-25 DIAGNOSIS — I509 Heart failure, unspecified: Secondary | ICD-10-CM | POA: Insufficient documentation

## 2013-11-25 DIAGNOSIS — I498 Other specified cardiac arrhythmias: Secondary | ICD-10-CM | POA: Insufficient documentation

## 2013-11-25 DIAGNOSIS — R0609 Other forms of dyspnea: Secondary | ICD-10-CM

## 2013-11-25 DIAGNOSIS — F329 Major depressive disorder, single episode, unspecified: Secondary | ICD-10-CM | POA: Insufficient documentation

## 2013-11-25 DIAGNOSIS — F3289 Other specified depressive episodes: Secondary | ICD-10-CM | POA: Insufficient documentation

## 2013-11-25 DIAGNOSIS — E669 Obesity, unspecified: Secondary | ICD-10-CM | POA: Insufficient documentation

## 2013-11-25 DIAGNOSIS — M1990 Unspecified osteoarthritis, unspecified site: Secondary | ICD-10-CM | POA: Insufficient documentation

## 2013-11-25 DIAGNOSIS — I209 Angina pectoris, unspecified: Secondary | ICD-10-CM

## 2013-11-25 DIAGNOSIS — E785 Hyperlipidemia, unspecified: Secondary | ICD-10-CM

## 2013-11-25 DIAGNOSIS — I2510 Atherosclerotic heart disease of native coronary artery without angina pectoris: Secondary | ICD-10-CM | POA: Insufficient documentation

## 2013-11-25 DIAGNOSIS — Z9861 Coronary angioplasty status: Secondary | ICD-10-CM

## 2013-11-25 DIAGNOSIS — Z7982 Long term (current) use of aspirin: Secondary | ICD-10-CM | POA: Insufficient documentation

## 2013-11-25 DIAGNOSIS — G4730 Sleep apnea, unspecified: Secondary | ICD-10-CM | POA: Insufficient documentation

## 2013-11-25 DIAGNOSIS — I10 Essential (primary) hypertension: Secondary | ICD-10-CM

## 2013-11-25 DIAGNOSIS — E119 Type 2 diabetes mellitus without complications: Secondary | ICD-10-CM | POA: Insufficient documentation

## 2013-11-25 DIAGNOSIS — K219 Gastro-esophageal reflux disease without esophagitis: Secondary | ICD-10-CM | POA: Insufficient documentation

## 2013-11-25 DIAGNOSIS — R0600 Dyspnea, unspecified: Secondary | ICD-10-CM | POA: Diagnosis present

## 2013-11-25 DIAGNOSIS — I200 Unstable angina: Secondary | ICD-10-CM | POA: Insufficient documentation

## 2013-11-25 DIAGNOSIS — Z87891 Personal history of nicotine dependence: Secondary | ICD-10-CM | POA: Insufficient documentation

## 2013-11-25 HISTORY — DX: Atherosclerotic heart disease of native coronary artery without angina pectoris: I25.10

## 2013-11-25 HISTORY — PX: LEFT HEART CATHETERIZATION WITH CORONARY ANGIOGRAM: SHX5451

## 2013-11-25 HISTORY — DX: Atherosclerotic heart disease of native coronary artery without angina pectoris: Z98.61

## 2013-11-25 HISTORY — PX: CORONARY ANGIOPLASTY WITH STENT PLACEMENT: SHX49

## 2013-11-25 LAB — POCT ACTIVATED CLOTTING TIME: Activated Clotting Time: 520 seconds

## 2013-11-25 LAB — MRSA PCR SCREENING: MRSA by PCR: NEGATIVE

## 2013-11-25 LAB — GLUCOSE, CAPILLARY
GLUCOSE-CAPILLARY: 119 mg/dL — AB (ref 70–99)
Glucose-Capillary: 135 mg/dL — ABNORMAL HIGH (ref 70–99)

## 2013-11-25 SURGERY — LEFT HEART CATHETERIZATION WITH CORONARY ANGIOGRAM
Anesthesia: LOCAL

## 2013-11-25 SURGERY — LOOP RECORDER IMPLANT
Anesthesia: LOCAL

## 2013-11-25 MED ORDER — SODIUM CHLORIDE 0.9 % IV SOLN: 1.0000 mL/kg/h | INTRAVENOUS | Status: AC

## 2013-11-25 MED ORDER — ONDANSETRON HCL 4 MG/2ML IJ SOLN: 4.0000 mg | Freq: Four times a day (QID) | INTRAMUSCULAR | Status: DC | PRN

## 2013-11-25 MED ORDER — BIVALIRUDIN 250 MG IV SOLR
INTRAVENOUS | Status: AC
  Filled 2013-11-25: qty 250

## 2013-11-25 MED ORDER — SODIUM CHLORIDE 0.9 % IJ SOLN: 3.0000 mL | INTRAMUSCULAR | Status: DC | PRN

## 2013-11-25 MED ORDER — TICAGRELOR 90 MG PO TABS
90.0000 mg | ORAL_TABLET | Freq: Two times a day (BID) | ORAL | Status: DC
  Administered 2013-11-25 – 2013-11-26 (×2): 90 mg via ORAL
  Filled 2013-11-25 (×3): qty 1

## 2013-11-25 MED ORDER — SODIUM CHLORIDE 0.9 % IV SOLN
INTRAVENOUS | Status: DC
  Administered 2013-11-25: 1000 mL via INTRAVENOUS

## 2013-11-25 MED ORDER — MORPHINE SULFATE 2 MG/ML IJ SOLN: 1.0000 mg | INTRAMUSCULAR | Status: DC | PRN

## 2013-11-25 MED ORDER — LIDOCAINE HCL (PF) 1 % IJ SOLN
INTRAMUSCULAR | Status: AC
  Filled 2013-11-25: qty 30

## 2013-11-25 MED ORDER — HEPARIN SODIUM (PORCINE) 1000 UNIT/ML IJ SOLN
INTRAMUSCULAR | Status: AC
  Filled 2013-11-25: qty 1

## 2013-11-25 MED ORDER — ASPIRIN 81 MG PO CHEW
81.0000 mg | CHEWABLE_TABLET | Freq: Every day | ORAL | Status: DC
  Administered 2013-11-26: 09:00:00 81 mg via ORAL
  Filled 2013-11-25: qty 1

## 2013-11-25 MED ORDER — SODIUM CHLORIDE 0.9 % IJ SOLN
3.0000 mL | INTRAMUSCULAR | Status: DC | PRN
Start: 2013-11-25 — End: 2013-11-26

## 2013-11-25 MED ORDER — NITROGLYCERIN 0.2 MG/ML ON CALL CATH LAB
INTRAVENOUS | Status: AC
  Filled 2013-11-25: qty 1

## 2013-11-25 MED ORDER — ASPIRIN 81 MG PO CHEW
81.0000 mg | CHEWABLE_TABLET | ORAL | Status: AC
  Administered 2013-11-25: 81 mg via ORAL
  Filled 2013-11-25: qty 1

## 2013-11-25 MED ORDER — IRBESARTAN 150 MG PO TABS
150.0000 mg | ORAL_TABLET | Freq: Every day | ORAL | Status: DC
  Administered 2013-11-26: 150 mg via ORAL
  Filled 2013-11-25 (×2): qty 1

## 2013-11-25 MED ORDER — SODIUM CHLORIDE 0.9 % IV SOLN: 250.0000 mL | INTRAVENOUS | Status: DC | PRN

## 2013-11-25 MED ORDER — HEPARIN (PORCINE) IN NACL 2-0.9 UNIT/ML-% IJ SOLN
INTRAMUSCULAR | Status: AC
  Filled 2013-11-25: qty 1000

## 2013-11-25 MED ORDER — SODIUM CHLORIDE 0.9 % IJ SOLN: 3.0000 mL | Freq: Two times a day (BID) | INTRAMUSCULAR | Status: DC

## 2013-11-25 MED ORDER — VERAPAMIL HCL 2.5 MG/ML IV SOLN
INTRAVENOUS | Status: AC
  Filled 2013-11-25: qty 2

## 2013-11-25 MED ORDER — ACETAMINOPHEN 325 MG PO TABS: 650.0000 mg | ORAL_TABLET | ORAL | Status: DC | PRN

## 2013-11-25 MED ORDER — FAMOTIDINE 20 MG PO TABS
20.0000 mg | ORAL_TABLET | Freq: Every day | ORAL | Status: DC
  Administered 2013-11-26: 09:00:00 20 mg via ORAL
  Filled 2013-11-25 (×2): qty 1

## 2013-11-25 MED ORDER — TICAGRELOR 90 MG PO TABS
ORAL_TABLET | ORAL | Status: AC
  Administered 2013-11-26: 09:00:00 90 mg via ORAL
  Filled 2013-11-25: qty 2

## 2013-11-25 NOTE — CV Procedure (Signed)
CARDIAC CATHETERIZATIONAND AND PERCUTANEOUS CORONARY  NTERVENTION REPORT  NAME:  Johnny Kane   MRN: [id number] DOB:  1941/05/16   ADMIT DATE: 11/25/2013 Procedure Date: 11/25/2013  INTERVENTIONAL CARDIOLOGIST: Leonie Man, M.D., MS PRIMARY CARE PROVIDER: Leonides Sake, MD PRIMARY CARDIOLOGIST: Virl Axe  PATIENT:  Johnny Kane is a 73 y.o. male phoned Dr. Caryl Comes. He has been noting worsening exertional chest pain and dyspnea consistent with Class 3-4 Angina.  In the past she has had difficulty with stress tests, and the total results. Therefore he was afforded for diagnostic cardiac catheterization plus minus PCI based on the unstable nature of his symptoms.   PRE-OPERATIVE DIAGNOSIS:    Unstable Angina -- progressively worsening Class III Angina  Abdominal Aortic Aneurysm   PROCEDURES PERFORMED:    Left Heart Catheterization Coronary Angiography  Percutaneous Coronary Intervention of the early mid RCA 95% stenosis with a Xience Alpine DES stent 3.5 mm x 18 mm --> postdilated to 3.8 mm  PROCEDURE:Consent:  Risks of procedure as well as the alternatives and risks of each were explained to the (patient/caregiver).  Consent for procedure obtained. Consent for signed by MD and patient with RN witness -- placed on chart.   PROCEDURE: The patient was brought to the [facility] in the fasting state and prepped and draped in the usual sterile fashion for Right groin or radial access. A modified Allen's test with plethysmography was performed, revealing excellent Ulnar artery collateral flow.  Sterile technique was used including antiseptics, cap, gloves, gown, hand hygiene, mask and sheet.  Skin prep: Chlorhexidine.  Time Out: Verified patient identification, verified procedure, site/side was marked, verified correct patient position, special equipment/implants available, medications/allergies/relevent history reviewed, required imaging and  test results available.  Performed  Access: Right Radial Artery; 6 Fr Sheath -- Seldinger technique (Angiocath Micropuncture Kit)  IA Radial Cocktail - 10 mL, IV Heparin - 6000 units  Diagnostic Cardiac Catheterization:  TIG 4.0, advanced and exchanged over long exchange safety J-wire, with multiple catheter exchanges due to difficulty engaging the left coronary artery.  Right Coronary Artery Angiography: TIG 4.0  Left Coronary Artery Angiography: After unsuccessful attempts with a 4.0, JL 3.5, EBU 3.5, EZ Rad Left, AL-1 finally a 6 [place] XB LAD 3.5 guide catheter was used  LV Hemodynamics: TIG 4.0  TR Band:  0935 Hours, 13 mL air  MEDICATIONS:  Anesthesia:  Local Lidocaine 2 ml  Sedation:  None  Premedication: 5 mg oral Valium  Omnipaque Contrast: 240 ml  Anticoagulation:  IV Heparin 6000 Units ; Angiomax Bolus & drip  Anti-Platelet Agent:  Brilinta 180 mg by mouth Radial Cocktail: 5 mg Verapamil, 400 mcg NTG, 2 ml 2% Lidocaine in 10 ml NS  Hemodynamics:  Central Aortic / Mean Pressures: 90/55 mmHg; 69 mmHg  Left Ventricular Pressures / EDP: 94/5 mmHg; 15 mmHg  Left Ventriculography: Not performed, following PCI the radial artery began to spasm, and therefore the decision was made to not advance the pigtail catheter on exchange.  Coronary Anatomy:  Left Main: Large-caliber vessel that trifurcates into the LAD, Circumflex, and Small Ramus Intermedius LAD: Large-caliber vessel that wraps around the apex. It gives rise to 2 proximal diagonal branches. Just prior to the first one branch is a roughly 20% stenosis. The remainder the vessel into a normal.  D1 and 2: Moderate caliber vessels that are tortuous but angiographically normal.  Left Circumflex: Large-caliber vessel that gives off a small distal AV groove branch  for bifurcating in the AV groove into an OM 1 and OM 2. Just prior to bifurcation there is a roughly 30% stenosis and OM1 has a ostial 40% stenosis.  OM1:  Moderate caliber vessel no significant disease the on the ostial 40% lesion.  OM 2: Moderate large-caliber vessel that courses around the inferolateral wall to the apex. It bifurcates distally. Tortuous but angiographic normal.  Ramus intermedius: Small to moderate caliber vessel which courses as a proximal diagonal branch. Angiographically normal.   RCA: Large caliber, dominant vessel with a eccentric/hazy 90-95% stenosis in the early portion of the mid vessel.  Beyond that there are no angiographically significant lesions as the vessel terminates as the Right Posterior Descending Artery that gives off a smaller moderate caliber Right Posterior AV Groove Branch (RPAV).  Reviewing the initial angiography revealed the obvious culprit lesion being the early mid RCA 90-95% stenosis. Based on the patient's symptoms, he met acceptable criteria based on the Appropriateness of Use Criteria. Then made to proceed to PCI.  Percutaneous Coronary Intervention:   Lesion: 90-95% early mid RCA with TIMI 3 flow --> reduced to 0% with TIMI-3 flow Guide: 6 Fr   JR 4 Guidewire: Run-through Predilation Balloon: Trek 2.5 mm x 12 mm;   10 Atm x 30 Sec,  Stent: Xience Alpine DES 3.5 mm x 18 mm;   14 Atm x 30 Sec-- ~ 3.7 mm Post-dilation Balloon: Brule Euphora 3.75 mm x 12 mm;   14 Atm x 45 Sec, Final Diameter = 3.8  Post deployment angiography in multiple views, with and without guidewire in place revealed excellent stent deployment and lesion coverage.  There was no evidence of dissection or perforation.  PATIENT DISPOSITION:    The patient was transferred to the PACU holding area in a hemodynamicaly stable, chest pain free condition.  The patient tolerated the procedure well, and there were no complications.  EBL:   < 10 ml  The patient was stable before, during, and after the procedure.  POST-OPERATIVE DIAGNOSIS:    Severe single-vessel CAD involving the early portion of the mid RCA  Successful PCI  with a single Xience Alpine DES stent 3.5 mm x 18 mm postdilated to 3.8 mm  Mildly elevated LVEDP.  PLAN OF CARE:  The patient will be admitted to the post procedure unit for standard post radial cath care.  He'll need to be on dual and a platelet therapy of 1 year.  It has been discharged tomorrow morning if stable.  He will followup with Dr. Caryl Comes following discharge.  Leonie Man, M.D., M.S. [facility] [address] [place], [place]  [address]  [phone]  11/25/2013 9:42 AM

## 2013-11-25 NOTE — Progress Notes (Addendum)
Patient complained of jaw pain and bilateral shoulder pain, "started when he blew up the baloon.". Stated it was a lot worse then and has continued to get better since then. Rated 1/10 scale at this time, instructed to report any worsening changes. 1200 After rechecking patient frequently since admission to unit, he reported decrease to 1/2 on scale of 10, then resolving of shoulder discomfort bilateral and at 1115 reported no further discomfort in jaw or shoulders.

## 2013-11-25 NOTE — Interval H&P Note (Signed)
History and Physical Interval Note:  11/25/2013 8:33 AM  Johnny Kane  has presented today for surgery, with the diagnosis of Palpitations  The various methods of treatment have been discussed with the patient and family. After consideration of risks, benefits and other options for treatment, the patient has consented to  Procedure(s): LOOP RECORDER IMPLANT (N/A) as a surgical intervention .  The patient's history has been reviewed, patient examined, no change in status, stable for surgery.  I have reviewed the patient's chart and labs.  Questions were answered to the patient's satisfaction.     Johnny Kane  Cath pending for bradycardia and symptoms of presyncope and syncope?  cause

## 2013-11-25 NOTE — H&P (View-Only) (Signed)
  ELECTROPHYSIOLOGY CONSULT NOTE  Patient ID: Johnny Kane, MRN: [id number], DOB/AGE: 03/11/1941 73 y.o. Admit date: (Not on file) Date of Consult: 11/15/2013  Primary Physician: [name],MAURA L, MD Primary Cardiologist:  Chief Complaint:    HPI Johnny Kane is a 73 y.o. male is seen with complaints of chest pain, episodic bradycardia, dyspnea on exertion in the context of remote vasovagal syncope and temporally related to the recent tragic death of his great-grandson  Past Medical History  Diagnosis Date  . Hiatal hernia   . Diverticulosis of colon   . History of MRSA infection   . Hypertension   . Depression   . Hyperlipidemia   . OA (osteoarthritis)   . GERD (gastroesophageal reflux disease)   . DOE (dyspnea on exertion)   . Palpitations   . Vitamin B 12 deficiency   . Dysrhythmia     skips a beat   . Sleep apnea     mild not on cpap   . Heart murmur     as a child no problems since   . AAA (abdominal aortic aneurysm)     checked 2013 not seen   . Low testosterone   . Diabetes mellitus without complication   . Vasovagal episode       Surgical History:  Past Surgical History  Procedure Laterality Date  . Replacement total knee      one on each knee  . Us echocardiography  03/2007    EF 55-60%  . Tonsillectomy    . Cholecystectomy N/A 07/20/2013    Procedure: LAPAROSCOPIC CHOLECYSTECTOMY;  Surgeon: Thomas A. Cornett, MD;  Location: [facility];  Service: General;  Laterality: N/A;     Home Meds: Prior to Admission medications   Medication Sig Start Date End Date Taking? Authorizing Provider  aspirin 81 MG tablet Take 81 mg by mouth daily.     Yes Historical Provider, MD  BENICAR 40 MG tablet Take 40 mg by mouth every evening.  06/11/13  Yes Historical Provider, MD  citalopram (CELEXA) 40 MG tablet Take 40 mg by mouth daily.   Yes Historical Provider, MD  clindamycin (CLEOCIN) 300 MG capsule Take 300 mg by mouth as needed.  07/28/13  Yes Historical Provider, MD    famotidine (PEPCID) 20 MG tablet Take 20 mg by mouth 2 (two) times daily.  06/04/13  Yes Historical Provider, MD  Omega-3 Fatty Acids (FISH OIL) 1200 MG CAPS Take by mouth daily.   Yes Historical Provider, MD  pravastatin (PRAVACHOL) 40 MG tablet Take 40 mg by mouth every evening.  07/08/13  Yes Historical Provider, MD      Allergies:  Allergies  Allergen Reactions  . Ciprofloxacin Hives and Itching  . Esomeprazole Magnesium     unknown  . Penicillins     unknown  . Soma [Carisoprodol]     unknown  . Sulfa Drugs Cross Reactors     unknown    History   Social History  . Marital Status: Married    Spouse Name: N/A    Number of Children: 2  . Years of Education: N/A   Occupational History  . [employer]     retired   Social History Main Topics  . Smoking status: Former Smoker -- 11 years    Quit date: 11/04/1967  . Smokeless tobacco: Never Used  . Alcohol Use: No  . Drug Use: No  . Sexual Activity: Not on file   Other Topics Concern  . Not on   file   Social History Narrative  . No narrative on file     Family History  Problem Relation Age of Onset  . Asthma Mother   . Hypertension Mother   . Heart attack Father      ROS:  Please see the history of present illness.     All other systems reviewed and negative.    Physical Exam:   Blood pressure 150/92, pulse 81, height 6\' 1"  (1.854 m), weight 236 lb 8 oz (107.276 kg). General: Well developed, well nourished male in no acute distress. Head: Normocephalic, atraumatic, sclera non-icteric, no xanthomas, nares are without discharge. EENT: normal Lymph Nodes:  none Back: without scoliosis/kyphosis *, no CVA tendersness Neck: Negative for carotid bruits. JVD not elevated. Lungs: Clear bilaterally to auscultation without wheezes, rales, or rhonchi. Breathing is unlabored. Heart: RRR with S1 S2. No   2/6 systolic  murmur , rubs, or gallops appreciated. Abdomen: Soft, non-tender, non-distended with normoactive bowel  sounds. No hepatomegaly. No rebound/guarding. No obvious abdominal masses. Msk:  Strength and tone appear normal for age. Extremities: No clubbing or cyanosis. 2 edema.  Distal pedal pulses are 2+ and equal bilaterally. Skin: Warm and Dry Neuro: Alert and oriented X 3. CN III-XII intact Grossly normal sensory and motor function . Psych:  Responds to questions appropriately with a normal affect.      Labs: Cardiac Enzymes No results found for this basename: CKTOTAL, CKMB, TROPONINI,  in the last 72 hours CBC Lab Results  Component Value Date   WBC 10.0 07/27/2013   HGB 13.5 07/27/2013   HCT 38.8* 07/27/2013   MCV 87.0 07/27/2013   PLT 192 07/27/2013   PROTIME: No results found for this basename: LABPROT, INR,  in the last 72 hours Chemistry No results found for this basename: NA, K, CL, CO2, BUN, CREATININE, CALCIUM, LABALBU, PROT, BILITOT, ALKPHOS, ALT, AST, GLUCOSE,  in the last 168 hours Lipids No results found for this basename: CHOL, HDL, LDLCALC, TRIG   BNP No results found for this basename: probnp   Miscellaneous No results found for this basename: DDIMER    Radiology/Studies:  No results found.  EKG:     Assessment and Plan:   Symptomatic bradycardia  Exertional chest pain  Dyspnea on exertion-HFpEF   Obesity  Sleep disturbance-unspecified  Syncope-vasovagal  Recent loss of a great-grandson  The patient has episodic bradycardia which is associated with significant symptoms. It is comes in clusters and has not happened recently but had recurrently. We will plan to proceed with implantable loop recorder  He has had chest discomfort over the years and has had more of late. He had a stress test couple of years ago that was unrevealing. Given the typical symptoms and a frequent negative stress test, will undertake catheterization  He has evidence of volume overload. We will begin him on a diuretic. I suspect this contributes to his exercise intolerance may be  part of his chest pain syndrome also.    Virl Axe

## 2013-11-25 NOTE — Progress Notes (Signed)
TR BAND REMOVAL  LOCATION:    right radial  DEFLATED PER PROTOCOL:    yes  TIME BAND OFF / DRESSING APPLIED:    1300   SITE UPON ARRIVAL:    Level 0  SITE AFTER BAND REMOVAL:    Level 0  REVERSE ALLEN'S TEST:     positive  CIRCULATION SENSATION AND MOVEMENT:    Within Normal Limits   yes  COMMENTS:   1330 Rechecked right radial site, gauze dressing dry and intact, radial and ulnar pulses present and CSMs wnls.

## 2013-11-25 NOTE — Interval H&P Note (Signed)
History and Physical Interval Note:  11/25/2013 6:48 AM  Johnny Kane  has presented today for surgery, with the diagnosis of: Principal Problem:   Angina, class III Active Problems:   DOE (dyspnea on exertion)   AAA (abdominal aortic aneurysm)   Hypertension   Hyperlipidemia  He has been referred by Dr. Caryl Comes for invasive coronary evaluation (history of equivocal ST results) to evaluate progressively worsening DOE & at exertional CP c/w ~Class III Angina.   The various methods of treatment have been discussed with the patient and family. .  The procedure with Risks/Benefits/Alternatives and Indications was reviewed with the patient and wife.  All questions were answered.    Risks / Complications include, but not limited to: Death, MI, CVA/TIA, VF/VT (with defibrillation), Bradycardia (need for temporary pacer placement), contrast induced nephropathy, bleeding / bruising / hematoma / pseudoaneurysm, vascular or coronary injury (with possible emergent CT or Vascular Surgery), adverse medication reactions, infection.    After consideration of risks, benefits and other options for treatment, the patient has consented to  Procedure(s): LEFT HEART CATHETERIZATION WITH CORONARY ANGIOGRAM (N/A) +/- PERCUTANEOUS CORONARY INTERVENTION as a surgical intervention .    The patient's history has been reviewed, patient examined, no change in status, stable for surgery.  I have reviewed the patient's chart and labs.  Questions were answered to the patient's satisfaction.     Clytie Shetley W  Cath Lab Visit (complete for each Cath Lab visit)  Clinical Evaluation Leading to the Procedure:   ACS: no  Non-ACS:    Anginal Classification: CCS III  Anti-ischemic medical therapy: Maximal Therapy (2 or more classes of medications)  Non-Invasive Test Results: Equivocal test results  Prior CABG: No previous CABG

## 2013-11-25 NOTE — Brief Op Note (Signed)
   11/25/2013  10:09 AM  PATIENT:  Johnny Kane  73 y.o. male who is followed by Dr. Caryl Comes. He has been noting worsening exertional chest pain and dyspnea. These symptoms are concerning for at least class III angina. He was referred for cardiac catheterization. There is also plan for possible loop recorder placement depending on the results of the cardiac catheterization.  PRE-OPERATIVE DIAGNOSIS:  Class III Angina  POST-OPERATIVE DIAGNOSIS:    Severe single-vessel CAD with 95% early mid RCA --> status post PCI with a Xience Alpine 3.5 mm x 18 mm postdilated to 3.8 mm  Minimal disease in the LAD and circumflex system with very large caliber vessels.  Left ventriculogram is not performed secondary to radial spasm at the completion of PCI.  Hemodynamics: Aortic pressure 91/33 mmHg, mean 67 mmHg. LV pressures -- 90/5 mmHg, LVEDP 10 mmHg  PROCEDURE:  Procedure(s): LEFT HEART CATHETERIZATION WITH CORONARY ANGIOGRAM (N/A)  SURGEON:  Surgeon(s) and Role:    * Leonie Man, MD - Primary  ANESTHESIA:   IV lidocaine and access site; patient was premedicated with 5 mg oral Valium and did not require further sedation.  EBL:    < 10 ml  Procedure: Right radial access using the Seldinger technique and micropuncture Angiocath kit. Radial cocktail administered along on IV heparin to  TIG 4.0 catheter advanced and exchanged over long exchange safety J-wire. Multiple further catheters were used before successfully has never left coronary artery.  RCA angiography: TIG 4.0  LCA angiography: After unsuccessful attempts with a 4.0, JL 3.5, EBU 3.5, EZ Rad Left, AL-1 finally a 6 [place] XB LAD 3.5 guide catheter was used  PCI - 6Fr JR 4 Guide - Runthrough Wire -- Predil 2.5 mm balloon; Stent Xeince Alpine 3.5 mm x 18 mm; post-dilation 3.75 mm x 15 mm --> final diameter ~3.8 mm  LV Gram not done.  TR BAND: 13 ml, 0935.  DICTATION: .Note written in [place]: Admit for overnight  observation  PATIENT DISPOSITION:  PACU - hemodynamically stable.   Delay start of Pharmacological VTE agent (>24hrs) due to surgical blood loss or risk of bleeding: not applicable  Ardice Boyan W, M.D., M.S. [facility] [address]. [place], [place]  [address]  [phone] Pager # [phone] 11/25/2013 10:17 AM

## 2013-11-26 ENCOUNTER — Encounter (HOSPITAL_COMMUNITY): Payer: Self-pay | Admitting: Cardiology

## 2013-11-26 DIAGNOSIS — I209 Angina pectoris, unspecified: Secondary | ICD-10-CM

## 2013-11-26 DIAGNOSIS — I2510 Atherosclerotic heart disease of native coronary artery without angina pectoris: Secondary | ICD-10-CM | POA: Diagnosis present

## 2013-11-26 DIAGNOSIS — Z9861 Coronary angioplasty status: Secondary | ICD-10-CM

## 2013-11-26 LAB — BASIC METABOLIC PANEL
BUN: 14 mg/dL (ref 6–23)
CALCIUM: 9 mg/dL (ref 8.4–10.5)
CO2: 25 mEq/L (ref 19–32)
Chloride: 104 mEq/L (ref 96–112)
Creatinine, Ser: 1.04 mg/dL (ref 0.50–1.35)
GFR, EST AFRICAN AMERICAN: 80 mL/min — AB (ref >90)
GFR, EST NON AFRICAN AMERICAN: 69 mL/min — AB (ref >90)
Glucose, Bld: 115 mg/dL — ABNORMAL HIGH (ref 70–99)
POTASSIUM: 4.3 meq/L (ref 3.7–5.3)
SODIUM: 141 meq/L (ref 137–147)

## 2013-11-26 LAB — CBC
HEMATOCRIT: 39 % (ref 39.0–52.0)
Hemoglobin: 13.3 g/dL (ref 13.0–17.0)
MCH: 28.8 pg (ref 26.0–34.0)
MCHC: 34.1 g/dL (ref 30.0–36.0)
MCV: 84.4 fL (ref 78.0–100.0)
Platelets: 158 10*3/uL (ref 150–400)
RBC: 4.62 MIL/uL (ref 4.22–5.81)
RDW: 13.8 % (ref 11.5–15.5)
WBC: 7.4 10*3/uL (ref 4.0–10.5)

## 2013-11-26 LAB — GLUCOSE, CAPILLARY: Glucose-Capillary: 98 mg/dL (ref 70–99)

## 2013-11-26 MED ORDER — TICAGRELOR 90 MG PO TABS: 90.0000 mg | ORAL_TABLET | Freq: Two times a day (BID) | ORAL | Status: DC

## 2013-11-26 MED ORDER — NITROGLYCERIN 0.4 MG SL SUBL: 0.4000 mg | SUBLINGUAL_TABLET | SUBLINGUAL | Status: DC | PRN

## 2013-11-26 MED ORDER — ACETAMINOPHEN 325 MG PO TABS: 650.0000 mg | ORAL_TABLET | ORAL | Status: DC | PRN

## 2013-11-26 NOTE — Discharge Instructions (Signed)
Coronary Angiography with Stent Coronary angiography with stent placement is a procedure to widen or open a narrow blood vessel of the heart (coronary artery). When a coronary artery becomes partially blocked, it decreases blood flow to that area. This may lead to chest pain or a heart attack (myocardial infarction). Arteries may become blocked by cholesterol buildup (plaque) in the lining or wall.  A stent is a small piece of metal that looks like a mesh or a spring. Stent placement may be done right after a coronary angiography in which a blocked artery is found or as a treatment for a heart attack.  LET [facility] CARE PROVIDER KNOW ABOUT:  Any allergies you have.   All medicines you are taking, including vitamins, herbs, eye drops, creams, and over-the-counter medicines.   Previous problems you or members of your family have had with the use of anesthetics.   Any blood disorders you have.   Previous surgeries you have had.   Medical conditions you have. RISKS AND COMPLICATIONS Generally, coronary angiography with stent is a safe procedure. However, as with any procedure, complications can occur. Possible complications include:   Damage to the heart or its blood vessels.   A return of blockage.   Bleeding at the site.   Blood clot in another part of the body.   Kidney injury.   Allergic reaction to the dye or contrast used.  BEFORE THE PROCEDURE  Do not eat or drink anything for 6 hours before the procedure.   Ask your health care provider if medicines can be taken with a sip of water.   Your health care provider will make sure you understand the procedure and the risks and potential complications associated with the procedure.  PROCEDURE  You may be given a medicine to help you relax before and during the procedure (sedative). This medicine will be given through an IV tube that is put into one of your veins.   The area where the catheter will be inserted  is shaved and cleaned. This is usually done in the groin but may be done in the fold of your arm (near your elbow) or in the wrist.   A medicine will be given to numb the area where the catheter will be inserted (local anesthetic).   The catheter is inserted into an artery using a guide wire. A type of X-ray (fluoroscopy) is used to help guide the catheter to the opening of the blocked artery.   A dye is then injected into the catheter, and X-rays are taken. The dye helps to show where any narrowing or blockages are located in the heart arteries.   A tiny wire is guided to the blocked spot, and a balloon is inflated to make the artery wider. The stent is expanded and crushes the plaque into the wall of the vessel. The stent holds the area open like a scaffolding and improves the blood flow.   Sometimes the artery may be made wider using a laser or other tools to remove plaque.   When the blood flow is better, the catheter is removed. The lining of the artery will grow over the stent, which stays where it was placed.  AFTER THE PROCEDURE  If the procedure is done through the leg, you will be kept in bed lying flat for about 6 hours. You will be instructed to not bend or cross your legs.   The insertion site will be checked frequently.   The pulse in your  feet or wrist will be checked frequently.   Additional blood tests, X-rays, and electrocardiography may be done. Document Released: 04/26/2003 Document Revised: 08/10/2013 Document Reviewed: 04/28/2013 [facility] Patient Information 2014 [place]. Radial Site Care Refer to this sheet in the next few weeks. These instructions provide you with information on caring for yourself after your procedure. Your caregiver may also give you more specific instructions. Your treatment has been planned according to current medical practices, but problems sometimes occur. Call your caregiver if you have any problems or questions after your  procedure. HOME CARE INSTRUCTIONS  You may shower the day after the procedure.Remove the bandage (dressing) and gently wash the site with plain soap and water.Gently pat the site dry.  Do not apply powder or lotion to the site.  Do not submerge the affected site in water for 3 to 5 days.  Inspect the site at least twice daily.  Do not flex or bend the affected arm for 24 hours.  No lifting over 5 pounds (2.3 kg) for 5 days after your procedure.  Do not drive home if you are discharged the same day of the procedure. Have someone else drive you.  You may drive 24 hours after the procedure unless otherwise instructed by your caregiver.  Do not operate machinery or power tools for 24 hours.  A responsible adult should be with you for the first 24 hours after you arrive home. What to expect:  Any bruising will usually fade within 1 to 2 weeks.  Blood that collects in the tissue (hematoma) may be painful to the touch. It should usually decrease in size and tenderness within 1 to 2 weeks. SEEK IMMEDIATE MEDICAL CARE IF:  You have unusual pain at the radial site.  You have redness, warmth, swelling, or pain at the radial site.  You have drainage (other than a small amount of blood on the dressing).  You have chills.  You have a fever or persistent symptoms for more than 72 hours.  You have a fever and your symptoms suddenly get worse.  Your arm becomes pale, cool, tingly, or numb.  You have heavy bleeding from the site. Hold pressure on the site. Document Released: 11/22/2010 Document Revised: 01/12/2012 Document Reviewed: 11/22/2010 [facility] Patient Information 2014 [place], [place].

## 2013-11-26 NOTE — Progress Notes (Signed)
Patient ID: JOUSHA SCHWANDT, male   DOB: [birth year], 74 y.o.   MRN: [id number]    Primary cardiologist:  Subjective:   No complaints overnight   Objective:   Temp:  [97.4 F (36.3 C)-98.8 F (37.1 C)] 97.9 F (36.6 C) (01/24 0751) Pulse Rate:  [68-89] 78 (01/24 0751) Resp:  [16-20] 18 (01/24 0751) BP: (137-171)/(70-98) 164/89 mmHg (01/24 0751) SpO2:  [95 %-100 %] 99 % (01/24 0751) Weight:  [227 lb 8.2 oz (103.2 kg)] 227 lb 8.2 oz (103.2 kg) (01/24 0007)    Filed Weights   11/25/13 0549 11/26/13 0007  Weight: 227 lb (102.967 kg) 227 lb 8.2 oz (103.2 kg)    Intake/Output Summary (Last 24 hours) at 11/26/13 0755 Last data filed at 11/26/13 0553  Gross per 24 hour  Intake   1425 ml  Output   1750 ml  Net   -325 ml    Telemetry: NSR, PVCs  Exam:  General: NAD  Resp: CTAB  Cardiac: RRR, no m/r/g, no JVD  ZG:YFVC, Nt, ND  MSK: extremities warm. Right wrist with no hematoma  Neuro: no focal deficits  Psych: appropriate affect  Lab Results:  Basic Metabolic Panel:  Recent Labs Lab 11/26/13 0600  NA 141  K 4.3  CL 104  CO2 25  GLUCOSE 115*  BUN 14  CREATININE 1.04  CALCIUM 9.0    Liver Function Tests: No results found for this basename: AST, ALT, ALKPHOS, BILITOT, PROT, ALBUMIN,  in the last 168 hours  CBC:  Recent Labs Lab 11/26/13 0600  WBC 7.4  HGB 13.3  HCT 39.0  MCV 84.4  PLT 158    Cardiac Enzymes: No results found for this basename: CKTOTAL, CKMB, CKMBINDEX, TROPONINI,  in the last 168 hours  BNP: No results found for this basename: PROBNP,  in the last 8760 hours  Coagulation: No results found for this basename: INR,  in the last 168 hours  ECG:   Medications:   Scheduled Medications: . aspirin  81 mg Oral Daily  . famotidine  20 mg Oral Daily  . irbesartan  150 mg Oral Daily  . sodium chloride  3 mL Intravenous Q12H  . Ticagrelor  90 mg Oral BID     Infusions:     PRN Medications:  sodium chloride,  acetaminophen, morphine injection, ondansetron (ZOFRAN) IV, sodium chloride  11/25/13 Cath Hemodynamics:  Central Aortic / Mean Pressures: 90/55 mmHg; 69 mmHg  Left Ventricular Pressures / EDP: 94/5 mmHg; 15 mmHg Left Ventriculography: Not performed, following PCI the radial artery began to spasm, and therefore the decision was made to not advance the pigtail catheter on exchange.  Coronary Anatomy:  Left Main: Large-caliber vessel that trifurcates into the LAD, Circumflex, and Small Ramus Intermedius LAD: Large-caliber vessel that wraps around the apex. It gives rise to 2 proximal diagonal branches. Just prior to the first one Zyah Gomm is a roughly 20% stenosis. The remainder the vessel into a normal.  D1 and 2: Moderate caliber vessels that are tortuous but angiographically normal. Left Circumflex: Large-caliber vessel that gives off a small distal AV groove Dashanna Kinnamon for bifurcating in the AV groove into an OM 1 and OM 2. Just prior to bifurcation there is a roughly 30% stenosis and OM1 has a ostial 40% stenosis.  OM1: Moderate caliber vessel no significant disease the on the ostial 40% lesion.  OM 2: Moderate large-caliber vessel that courses around the inferolateral wall to the apex. It bifurcates distally. Tortuous but angiographic normal.  Ramus intermedius: Small to moderate caliber vessel which courses as a proximal diagonal Kathlene Yano. Angiographically normal.  RCA: Large caliber, dominant vessel with a eccentric/hazy 90-95% stenosis in the early portion of the mid vessel. Beyond that there are no angiographically significant lesions as the vessel terminates as the Right Posterior Descending Artery that gives off a smaller moderate caliber Right Posterior AV Groove Swain Acree (RPAV). Reviewing the initial angiography revealed the obvious culprit lesion being the early mid RCA 90-95% stenosis. Based on the patient's symptoms, he met acceptable criteria based on the Appropriateness of Use Criteria. Then  made to proceed to PCI.  Percutaneous Coronary Intervention:  Lesion: 90-95% early mid RCA with TIMI 3 flow --> reduced to 0% with TIMI-3 flow  Guide: 6 Fr JR 4 Guidewire: Run-through  Predilation Balloon: Trek 2.5 mm x 12 mm;  10 Atm x 30 Sec,  Stent: Xience Alpine DES 3.5 mm x 18 mm;  14 Atm x 30 Sec-- ~ 3.7 mm Post-dilation Balloon: [place] Euphora 3.75 mm x 12 mm;  14 Atm x 45 Sec, Final Diameter = 3.8 Post deployment angiography in multiple views, with and without guidewire in place revealed excellent stent deployment and lesion coverage. There was no evidence of dissection or perforation.  PATIENT DISPOSITION:  The patient was transferred to the PACU holding area in a hemodynamicaly stable, chest pain free condition.  The patient tolerated the procedure well, and there were no complications. EBL: < 10 ml  The patient was stable before, during, and after the procedure. POST-OPERATIVE DIAGNOSIS:  Severe single-vessel CAD involving the early portion of the mid RCA  Successful PCI with a single Xience Alpine DES stent 3.5 mm x 18 mm postdilated to 3.8 mm  Mildly elevated LVEDP. PLAN OF CARE:  The patient will be admitted to the post procedure unit for standard post radial cath care. He'll need to be on dual and a platelet therapy of 1 year.  It has been discharged tomorrow morning if stable. He will followup with Dr. Caryl Comes following discharge.        Assessment/Plan   73 yo male with history of HTN, HL, syncope, episodic bradycardia and chest pain referred for cath.   1. CAD - cath with LM normal, patent LD, LCX no significant stenosis, patent ramus, RCA 90-95% mid stenosis s/p DES - continue DAPT minimum of 1 year with ASA and brilinta, continue ARB and statin. Not on beta blocker due to history of bradycardia.  Follow up Dr Caryl Comes in 3 weeks.       Carlyle Dolly, M.D., F.A.C.C.

## 2013-11-26 NOTE — Progress Notes (Signed)
   CARE MANAGEMENT NOTE 11/26/2013  Patient:  Johnny Kane, Johnny Kane   Account Number:  [id number]  Date Initiated:  11/26/2013  Documentation initiated by:  [facility]  Subjective/Objective Assessment:   HGR:JWBDG pain, episodic bradycardia, dyspnea on exertion     Action/Plan:   discharge planning   Anticipated DC Date:  11/26/2013   Anticipated DC Plan:        [place]  CM consult  Medication Assistance      Choice offered to / List presented to:             Status of service:  Completed, signed off Medicare Important Message given?   (If response is "NO", the following Medicare IM given date fields will be blank) Date Medicare IM given:   Date Additional Medicare IM given:    Discharge Disposition:  HOME/SELF CARE  Per UR Regulation:    If discussed at Long Length of Stay Meetings, dates discussed:    Comments:  11/26/13 08:50 CM met with pt in room to give hime 30 day free trial card for Brilinta.  Pt will pursue preauthorization at follow up visit with cardiologist and use discount card if possible/pursue assistance throgh MDs office at followup.  No other CM needs were communicated. Mariane Masters, BSN, CM [phone].

## 2013-11-26 NOTE — Discharge Summary (Signed)
Patient ID: Johnny Kane,  MRN: [id number], DOB/AGE: 75-01-1941 75 y.o.  Admit date: 11/25/2013 Discharge date: 11/26/2013  Primary Care Provider: Dr Jerilynn Mages. [place] Primary Cardiologist: Dr Caryl Comes  Discharge Diagnoses Principal Problem:   Unstable angina Active Problems:   DOE (dyspnea on exertion)   CAD (coronary artery disease)- RCA DES 11/25/13   Hypertension   Hyperlipidemia    Procedures: Cath/PCI 11/25/13   Hospital Course:  73 y/o pt of Dr Caryl Comes seen earlier this month for symptomatic sinus bradycardia. He also related a history of chest pain and dyspnea suspicious for angina, It was decided to proceed with diagnostic cath after the pt had an equivocal stress test. This was done 11/25/13 and revealed high grade RCA disease which Dr Ellyn Hack treated with a DES. He tolerated the procedure well and we feel he can be discharged 11/26/13.   Discharge Vitals:  Blood pressure 164/89, pulse 78, temperature 97.9 F (36.6 C), temperature source Oral, resp. rate 18, height 5' 11"  (1.803 m), weight 227 lb 8.2 oz (103.2 kg), SpO2 99.00%.    Labs: Results for orders placed during the hospital encounter of 11/25/13 (from the past 48 hour(s))  GLUCOSE, CAPILLARY     Status: Abnormal   Collection Time    11/25/13  5:56 AM      Result Value Range   Glucose-Capillary 119 (*) 70 - 99 mg/dL  POCT ACTIVATED CLOTTING TIME     Status: None   Collection Time    11/25/13  9:04 AM      Result Value Range   Activated Clotting Time 520    MRSA PCR SCREENING     Status: None   Collection Time    11/25/13  5:10 PM      Result Value Range   MRSA by PCR NEGATIVE  NEGATIVE   Comment:            The GeneXpert MRSA Assay (FDA     approved for NASAL specimens     only), is one component of a     comprehensive MRSA colonization     surveillance program. It is not     intended to diagnose MRSA     infection nor to guide or     monitor treatment for     MRSA infections.  GLUCOSE, CAPILLARY     Status:  Abnormal   Collection Time    11/25/13 10:22 PM      Result Value Range   Glucose-Capillary 135 (*) 70 - 99 mg/dL   Comment 1 Documented in Chart     Comment 2 ROSELLE RN    CBC     Status: None   Collection Time    11/26/13  6:00 AM      Result Value Range   WBC 7.4  4.0 - 10.5 K/uL   RBC 4.62  4.22 - 5.81 MIL/uL   Hemoglobin 13.3  13.0 - 17.0 g/dL   HCT 39.0  39.0 - 52.0 %   MCV 84.4  78.0 - 100.0 fL   MCH 28.8  26.0 - 34.0 pg   MCHC 34.1  30.0 - 36.0 g/dL   RDW 13.8  11.5 - 15.5 %   Platelets 158  150 - 400 K/uL  BASIC METABOLIC PANEL     Status: Abnormal   Collection Time    11/26/13  6:00 AM      Result Value Range   Sodium 141  137 - 147 mEq/L   Potassium 4.3  3.7 - 5.3 mEq/L   Chloride 104  96 - 112 mEq/L   CO2 25  19 - 32 mEq/L   Glucose, Bld 115 (*) 70 - 99 mg/dL   BUN 14  6 - 23 mg/dL   Creatinine, Ser 1.04  0.50 - 1.35 mg/dL   Calcium 9.0  8.4 - 10.5 mg/dL   GFR calc non Af Amer 69 (*) >90 mL/min   GFR calc Af Amer 80 (*) >90 mL/min   Comment: (NOTE)     The eGFR has been calculated using the CKD EPI equation.     This calculation has not been validated in all clinical situations.     eGFR's persistently <90 mL/min signify possible Chronic Kidney     Disease.  GLUCOSE, CAPILLARY     Status: None   Collection Time    11/26/13  7:49 AM      Result Value Range   Glucose-Capillary 98  70 - 99 mg/dL   Comment 1 Notify RN      Disposition:      Follow-up Information   Follow up with Virl Axe, MD. (office will call you)    Specialty:  Cardiology   Contact information:   [address] [place] [address] [phone]       Discharge Medications:    Medication List         acetaminophen 325 MG tablet  Commonly known as:  TYLENOL  Take 2 tablets (650 mg total) by mouth every 4 (four) hours as needed for headache or mild pain.     aspirin 81 MG tablet  Take 81 mg by mouth daily.     citalopram 40 MG tablet  Commonly  known as:  CELEXA  Take 40 mg by mouth daily.     clindamycin 300 MG capsule  Commonly known as:  CLEOCIN  Take 300 mg by mouth as needed (dental procedures).     famotidine 20 MG tablet  Commonly known as:  PEPCID  Take 20 mg by mouth daily.     Fish Oil 1200 MG Caps  Take 2,400 mg by mouth daily.     furosemide 20 MG tablet  Commonly known as:  LASIX  Take 1 tablet (20 mg total) by mouth every other day.     modafinil 200 MG tablet  Commonly known as:  PROVIGIL  Take 200 mg by mouth 2 (two) times daily.     nitroGLYCERIN 0.4 MG SL tablet  Commonly known as:  NITROSTAT  Place 1 tablet (0.4 mg total) under the tongue every 5 (five) minutes as needed for chest pain.     olmesartan 40 MG tablet  Commonly known as:  BENICAR  Take 20 mg by mouth daily.     pravastatin 40 MG tablet  Commonly known as:  PRAVACHOL  Take 40 mg by mouth every evening.     Ticagrelor 90 MG Tabs tablet  Commonly known as:  BRILINTA  Take 1 tablet (90 mg total) by mouth 2 (two) times daily.         Duration of Discharge Encounter: Greater than 30 minutes including physician time.  Angelena Form PA-C 11/26/2013 8:39 AM

## 2013-11-26 NOTE — Progress Notes (Signed)
CARDIAC REHAB PHASE I   PRE:  Rate/Rhythm: 80 sinus  BP:  Sitting: 131/98   SaO2: 97 RA  MODE:  Ambulation: 600 ft   POST:  Rate/Rhythem: 91 sinus  BP:  Sitting: 175/85   SaO2: 98 RA  Pt ambulated 600 ft with assist x1.  Pt tolerated walk well without complaint.  Pt returned to bed for breakfast after walk.  Reviewed d/c education.  Discussed diet, exercise, stent/Plavix/ASA, CP/NTG use, calling MD/911, restrictions.  Also reviewed Cardiac Rehab Phase II, pt request to not go do to distance and plans to exercise on his own at home.  Pt voiced understanding.   Alberteen Sam, MA, ACSM RCEP   Luan Pulling, Nita Sells

## 2013-11-28 MED FILL — Sodium Chloride IV Soln 0.9%: INTRAVENOUS | Qty: 50 | Status: AC

## 2013-11-28 NOTE — Discharge Summary (Signed)
I agree with the above documentation    Carlyle Dolly MD

## 2013-12-04 HISTORY — PX: TRANSTHORACIC ECHOCARDIOGRAM: SHX275

## 2013-12-13 ENCOUNTER — Ambulatory Visit (INDEPENDENT_AMBULATORY_CARE_PROVIDER_SITE_OTHER): Payer: Medicare Other | Admitting: Internal Medicine

## 2013-12-13 ENCOUNTER — Encounter: Payer: Self-pay | Admitting: Internal Medicine

## 2013-12-13 VITALS — BP 134/77 | HR 75 | Ht 71.0 in | Wt 235.5 lb

## 2013-12-13 DIAGNOSIS — I2510 Atherosclerotic heart disease of native coronary artery without angina pectoris: Secondary | ICD-10-CM

## 2013-12-13 DIAGNOSIS — R079 Chest pain, unspecified: Secondary | ICD-10-CM

## 2013-12-13 DIAGNOSIS — I10 Essential (primary) hypertension: Secondary | ICD-10-CM

## 2013-12-13 DIAGNOSIS — R001 Bradycardia, unspecified: Secondary | ICD-10-CM

## 2013-12-13 DIAGNOSIS — I498 Other specified cardiac arrhythmias: Secondary | ICD-10-CM

## 2013-12-13 NOTE — Progress Notes (Signed)
Patient Care Team: Leonides Sake, MD as PCP - General (Family Medicine)   HPI  Johnny Kane is a 73 y.o. male Seen following a presentation for chest pain and episodic bradycardia. He underwent catheterization by Dr. Ellyn Hack a couple of weeks ago was found to have a tight RCA lesion.he received a DES; he had no other significant coronary disease.  His chest pain is much better but he continued to have some chest discomfort which is aggravated by palpation of his left chest; there is some arm radiation.  Left ventricular function was not assessed  He also has significant early a.m. Somnolence. He takes Provigil. He had a sleep study for 5 years ago that was unrevealing.  He has significant dyspnea on exertion. He sleeps upright in a chair attributed to GE reflux  Past Medical History  Diagnosis Date  . Hiatal hernia   . Diverticulosis of colon   . History of MRSA infection   . Hypertension   . Depression   . Hyperlipidemia   . OA (osteoarthritis)   . GERD (gastroesophageal reflux disease)   . DOE (dyspnea on exertion)   . Palpitations   . Vitamin B 12 deficiency   . Dysrhythmia     skips a beat   . Sleep apnea     mild not on cpap   . Heart murmur     as a child no problems since   . AAA (abdominal aortic aneurysm)     checked 2013 not seen   . Low testosterone   . Diabetes mellitus without complication   . Vasovagal episode   . CAD S/P percutaneous coronary angioplasty     mRCA (95% lesion) 3.5 mm x 18 mm DES -- 3.8 mm     Past Surgical History  Procedure Laterality Date  . Replacement total knee      one on each knee  . US echocardiography  03/2007    EF 55-60%  . Tonsillectomy    . Cholecystectomy N/A 07/20/2013    Procedure: LAPAROSCOPIC CHOLECYSTECTOMY;  Surgeon: Joyice Faster. Cornett, MD;  Location: [facility];  Service: General;  Laterality: N/A;  . Coronary angioplasty  11/25/2013    DES   RCA      Current Outpatient Prescriptions  Medication Sig  Dispense Refill  . acetaminophen (TYLENOL) 325 MG tablet Take 2 tablets (650 mg total) by mouth every 4 (four) hours as needed for headache or mild pain.      Marland Kitchen aspirin 81 MG tablet Take 81 mg by mouth daily.        . citalopram (CELEXA) 40 MG tablet Take 40 mg by mouth daily.      . clindamycin (CLEOCIN) 300 MG capsule Take 300 mg by mouth as needed (dental procedures).       . famotidine (PEPCID) 20 MG tablet Take 20 mg by mouth daily.       . furosemide (LASIX) 20 MG tablet Take 1 tablet (20 mg total) by mouth every other day.  90 tablet  3  . modafinil (PROVIGIL) 200 MG tablet Take 200 mg by mouth 2 (two) times daily.      . nitroGLYCERIN (NITROSTAT) 0.4 MG SL tablet Place 1 tablet (0.4 mg total) under the tongue every 5 (five) minutes as needed for chest pain.  25 tablet  2  . olmesartan (BENICAR) 40 MG tablet Take 20 mg by mouth daily.      . Omega-3 Fatty Acids (  FISH OIL) 1200 MG CAPS Take 2,400 mg by mouth daily.       . pravastatin (PRAVACHOL) 40 MG tablet Take 40 mg by mouth every evening.       . Ticagrelor (BRILINTA) 90 MG TABS tablet Take 1 tablet (90 mg total) by mouth 2 (two) times daily.  60 tablet  11   No current facility-administered medications for this visit.    Allergies  Allergen Reactions  . Ciprofloxacin Hives and Itching  . Esomeprazole Magnesium     unknown  . Penicillins     unknown  . Soma [Carisoprodol]     unknown  . Sulfa Drugs Cross Reactors     unknown    Review of Systems negative except from HPI and PMH  Physical Exam BP 134/77  Pulse 75  Ht 5\' 11"  (1.803 m)  Wt 235 lb 8 oz (106.822 kg)  BMI 32.86 kg/m2 Well developed and well nourished obese  in no acute distress HENT normal E scleral and icterus clear Neck Supple JVP 8 carotids brisk and full Clear to ausculation  Regular rate and rhythm, no murmurs gallops or rub Soft with active bowel sounds No clubbing cyanosis Trace Edema Alert and oriented, grossly normal motor and sensory  function Skin Warm and Dry    Assessment and  Plan

## 2013-12-13 NOTE — Patient Instructions (Addendum)
Your physician has requested that you have an echocardiogram. Echocardiography is a painless test that uses sound waves to create images of your heart. It provides your doctor with information about the size and shape of your heart and how well your heart's chambers and valves are working. This procedure takes approximately one hour. There are no restrictions for this procedure.   Your physician wants you to follow-up in: 12 months with Dr. Caryl Comes. You will receive a reminder letter in the mail two months in advance. If you don't receive a letter, please call our office to schedule the follow-up appointment.   Please see your primary care provider to evaluate for sleep study. Dr. Caryl Comes thinks it would be very beneficial.

## 2013-12-13 NOTE — Assessment & Plan Note (Signed)
Reasonably controlled

## 2013-12-13 NOTE — Assessment & Plan Note (Addendum)
He continues to have chest discomfort that is exertional and nonexertional as well as dyspnea on exertion. No other significant coronary disease. We'll undertake an echo to look at left ventricular function. Right now we'll continue him on his current medications. Nitrates and ranolazine might be of some benefit  We'll also undertake a sleep study. I will defer this to his PCP. His morbid obesity certainly makes this unlikely as does his early a.m. Somnolence and daytime fatigue.  I've also offered cardiac rehabilitation which he has declined

## 2013-12-20 ENCOUNTER — Other Ambulatory Visit: Payer: Medicare Other

## 2013-12-23 ENCOUNTER — Other Ambulatory Visit (INDEPENDENT_AMBULATORY_CARE_PROVIDER_SITE_OTHER): Payer: Medicare Other

## 2013-12-23 ENCOUNTER — Other Ambulatory Visit: Payer: Self-pay

## 2013-12-23 DIAGNOSIS — R079 Chest pain, unspecified: Secondary | ICD-10-CM

## 2013-12-23 DIAGNOSIS — R0989 Other specified symptoms and signs involving the circulatory and respiratory systems: Secondary | ICD-10-CM

## 2013-12-23 DIAGNOSIS — R0609 Other forms of dyspnea: Secondary | ICD-10-CM

## 2014-01-06 ENCOUNTER — Other Ambulatory Visit: Payer: Self-pay | Admitting: *Deleted

## 2014-01-06 MED ORDER — FUROSEMIDE 20 MG PO TABS: 20.0000 mg | ORAL_TABLET | ORAL | Status: DC

## 2014-02-27 ENCOUNTER — Telehealth: Payer: Self-pay

## 2014-02-27 NOTE — Telephone Encounter (Signed)
Patient called and stated he had chest pain and shortness of breath this morning  He stated he has chest pain often it is usually 2/10 His pain today was a 6/10 in the middle of his chest He stated it felt like a dull pain but was very strong  He did not do anything out of the ordinary before the pain started  He rested when the pain started and it resolved on its own  Sometimes has tried the nitro a few times but not with his morning pain He denies any other symptoms  He stated he is feeling fine now    He just wanted Dr. Caryl Comes to know that he is having this pain every morning now  He stated it eases off by 11 am  Patient states he is feeling great and pain free right now  He is planning to take the nitro with his morning pain tomorrow to see if he helps

## 2014-02-27 NOTE — Telephone Encounter (Signed)
Pt called and states he is still having CP and SOB, states he is "feeling pretty good now". States he had this at 10 this a.m. Please call.

## 2014-03-01 ENCOUNTER — Telehealth: Payer: Self-pay | Admitting: *Deleted

## 2014-03-01 NOTE — Telephone Encounter (Signed)
Please call patient. Patient wants to report back to you and is having some issues.

## 2014-03-01 NOTE — Telephone Encounter (Signed)
Patient called and stated he had the repeated chest pain today  He took one nitro and went to sleep  He is not sure if the Nitro helped or just made him sleepy  He would like to see someone because he not sure if the pain is cardiac   Appt made with NP 03/07/14 Patient advised to contact EMS if he feels his situation becomes emergent

## 2014-03-02 NOTE — Telephone Encounter (Signed)
Please arrange myoview  And start him on imdur 30  thnkas steve

## 2014-03-03 NOTE — Telephone Encounter (Signed)
LVM 5/1

## 2014-03-07 ENCOUNTER — Ambulatory Visit (INDEPENDENT_AMBULATORY_CARE_PROVIDER_SITE_OTHER): Payer: Medicare Other | Admitting: Nurse Practitioner

## 2014-03-07 ENCOUNTER — Encounter: Payer: Self-pay | Admitting: Nurse Practitioner

## 2014-03-07 VITALS — BP 112/70 | HR 86 | Ht 73.0 in | Wt 231.5 lb

## 2014-03-07 DIAGNOSIS — R531 Weakness: Secondary | ICD-10-CM | POA: Insufficient documentation

## 2014-03-07 DIAGNOSIS — R0789 Other chest pain: Secondary | ICD-10-CM

## 2014-03-07 DIAGNOSIS — R5383 Other fatigue: Secondary | ICD-10-CM

## 2014-03-07 DIAGNOSIS — R079 Chest pain, unspecified: Secondary | ICD-10-CM

## 2014-03-07 DIAGNOSIS — I2510 Atherosclerotic heart disease of native coronary artery without angina pectoris: Secondary | ICD-10-CM

## 2014-03-07 DIAGNOSIS — R5381 Other malaise: Secondary | ICD-10-CM

## 2014-03-07 DIAGNOSIS — E785 Hyperlipidemia, unspecified: Secondary | ICD-10-CM

## 2014-03-07 DIAGNOSIS — I10 Essential (primary) hypertension: Secondary | ICD-10-CM

## 2014-03-07 MED ORDER — ISOSORBIDE MONONITRATE 15 MG HALF TABLET: 15.0000 mg | ORAL_TABLET | Freq: Every day | ORAL | Status: DC

## 2014-03-07 NOTE — Progress Notes (Signed)
Patient Name: Johnny Kane Date of Encounter: 03/07/2014  Primary Care Provider:  Leonides Sake, MD Primary Cardiologist:  Johnny Bridge, MD / S. Caryl Comes, MD  Patient Profile  73 y/o male with h/o CAD s/p DES to the RCA in Jan 2015 who presents with chest pain and wkns.  Problem List   Past Medical History  Diagnosis Date  . Hiatal hernia   . Diverticulosis of colon   . History of MRSA infection   . Hypertension   . Depression   . Hyperlipidemia   . OA (osteoarthritis)   . GERD (gastroesophageal reflux disease)   . Palpitations   . Vitamin B 12 deficiency   . Palpitations   . Sleep apnea     a. mild not on cpap   . Heart murmur     a. 12/2013 Echo: EF 60-65%, Gr1 DD, mild AI, mildly dil LA.  Marland Kitchen AAA (abdominal aortic aneurysm)     checked 2013 not seen   . Low testosterone   . Diabetes mellitus without complication   . Vasovagal episode   . CAD (coronary artery disease)     a. 07/2011 Nl MV;  b. 11/2013 Cath: LM nl, LAD 20p, D1/2 nl, LCX 30p, OM1 40, OM2 nl, RCA 90-39m (3.5x18 Xience DES).   Past Surgical History  Procedure Laterality Date  . Replacement total knee      one on each knee  . US echocardiography  03/2007    EF 55-60%  . Tonsillectomy    . Cholecystectomy N/A 07/20/2013    Procedure: LAPAROSCOPIC CHOLECYSTECTOMY;  Surgeon: Joyice Faster. Cornett, MD;  Location: [facility];  Service: General;  Laterality: N/A;  . Coronary angioplasty  11/25/2013    DES   RCA      Allergies  Allergies  Allergen Reactions  . Ciprofloxacin Hives and Itching  . Esomeprazole Magnesium     unknown  . Penicillins     unknown  . Soma [Carisoprodol]     unknown  . Sulfa Drugs Cross Reactors     unknown    HPI  73 y/o male with the above problem list.  He has a long h/o chest pain and in January, he underwent cath revealing severe RCA dzs.  This was successfully stented with a DES.  Pt says that following stenting, he went about a month w/o any chest discomfort but in February, he  began to experience somewhat focal discomfort in his chest that would move around throughout the day - sometimes into his shoulder, his epigastrum, right chest, left chest, etc, and never really go away.  These discomforts have been present in a constant fashion for the past 2+ mos and he says that he always has some discomfort, somewhere, every day.  The discomfort is different from his prior angina and not nearly as severe.  It is not associated with dyspnea per se, but over the same period of time, he has noted a subjective dyspnea and fatigue, that occurs exclusively in the morning, lasting 1-2 hrs, and resolves spontaneously.  His blood pressure during these spells is often in the low-1-teens over 50's to 60's.  He tries not to exert himself during these weak spells b/c he says that he's afraid that he'll fall on his face but once the fatigue passes, he's able to go out for a walk w/o limitations.  He has an occasional, brief palpitation w/o lightheadedness, but he doesn't think that palpitations are part of his symptom constellation.  He had previously called into the office with these complaints and Dr. Caryl Comes recommended adding imdur and scheduling a stress test.  Neither of these were done.  He was added on to my schedule today.  As above, he has had mild chest discomfort throughout the day.  He is interested in some form of monitoring that will provide him with data as to why he feels the way he feels.  He denies pnd, orthopnea, n, v, dizziness, syncope, edema, weight gain, or early satiety.   Home Medications  Prior to Admission medications   Medication Sig Start Date End Date Taking? Authorizing Provider  acetaminophen (TYLENOL) 325 MG tablet Take 2 tablets (650 mg total) by mouth every 4 (four) hours as needed for headache or mild pain. 11/26/13  Yes Erlene Quan, PA-C  aspirin 81 MG tablet Take 81 mg by mouth daily.     Yes Historical Provider, MD  citalopram (CELEXA) 40 MG tablet Take 40 mg by  mouth daily.   Yes Historical Provider, MD  clindamycin (CLEOCIN) 300 MG capsule Take 300 mg by mouth as needed (dental procedures).  07/28/13  Yes Historical Provider, MD  famotidine (PEPCID) 20 MG tablet Take 20 mg by mouth daily.  06/04/13  Yes Historical Provider, MD  furosemide (LASIX) 20 MG tablet Take 1 tablet (20 mg total) by mouth every other day. 01/06/14  Yes Deboraha Sprang, MD  modafinil (PROVIGIL) 200 MG tablet Take 200 mg by mouth 2 (two) times daily.   Yes Historical Provider, MD  nitroGLYCERIN (NITROSTAT) 0.4 MG SL tablet Place 1 tablet (0.4 mg total) under the tongue every 5 (five) minutes as needed for chest pain. 11/26/13  Yes Luke K Kilroy, PA-C  olmesartan (BENICAR) 40 MG tablet Take 20 mg by mouth daily.   Yes Historical Provider, MD  Omega-3 Fatty Acids (FISH OIL) 1200 MG CAPS Take 2,400 mg by mouth daily.    Yes Historical Provider, MD  pravastatin (PRAVACHOL) 40 MG tablet Take 40 mg by mouth every evening.  07/08/13  Yes Historical Provider, MD  Ticagrelor (BRILINTA) 90 MG TABS tablet Take 1 tablet (90 mg total) by mouth 2 (two) times daily. 11/26/13  Yes Erlene Quan, PA-C  isosorbide mononitrate (IMDUR) 15 mg TB24 24 hr tablet Take 0.5 tablets (15 mg total) by mouth daily. 03/07/14   Rogelia Mire, NP    Review of Systems  Fatigue, wkns, chest pain, occas palps as above.  All other systems reviewed and are otherwise negative except as noted above.  Physical Exam  Blood pressure 112/70, pulse 86, height 6\' 1"  (1.854 m), weight 231 lb 8 oz (105.008 kg).  General: Pleasant, NAD Psych: Normal affect. Neuro: Alert and oriented X 3. Moves all extremities spontaneously. HEENT: Normal  Neck: Supple without bruits or JVD. Lungs:  Resp regular and unlabored, CTA. Heart: RRR no s3, s4, or murmurs. Abdomen: Soft, non-tender, non-distended, BS + x 4.  Extremities: No clubbing, cyanosis or edema. DP/PT/Radials 2+ and equal bilaterally.  Accessory Clinical Findings  ECG - rsr,  86, pvc, no acute st/t changes.  Assessment & Plan  1.  Atypical Chest Pain/CAD:  Pt presents with a 2+ month h/o mild, constant chest discomfort that moves across his chest and into his epigastric area.  He says that he always has some discomfort, somewhere, on a daily basis.  This is not worse with exertion nor does it limit his activities.  He is s/p stenting to his RCA with a  Xience DES in January and he says that his Ss now are very different from his Ss prior to stenting.  He remains on asa, brilinta, and statin therapy.  We had a long discussion about his Ss.  He was hoping to get a monitor that would potentially assess why he was feeling the way he was.  We discussed the utilization of holter and event monitoring and the information that they would provide, however he does not have much in the way of palpitations and has not had presyncope or syncope.  He'd like to defer monitoring at this time and it's not clear that there's an indication at this point.  We then discussed stress testing to r/o ischemia.  We discussed how prolonged Ss (several mos of constant c/p) were unlikely to be cardiac in origin however given his h/o CAD, we could consider a myoview to r/o ischemia.  He prefers to avoid stress testing as he did not have a good experience with his last chemical stress test (and he can't walk on a treadmill), and instead has agreed to a trial of low-dose imdur to see if this helps his chest discomfort.  As his BP trends on the low side, which itself may be causing some of his fatigue, I recommended that if his BP drops with the addition of imdur that he discontinue his benicar and continue to follow his BP @ home.  We will arrange for early f/u in a few wks to see how he's doing and then reconsider ischemic testing.  Of note, he does report occasional subjective dyspnea w/o DOE.  It's possible that this may be related to [place] however he does not want to switch to something else as he feels like  it's working well for him.  2.  Fatigue/wkns:  In the setting of above.  He says that he had labs for his annual physical recently by his PCP and thus I will not repeat any lab work today.  He thinks that some of this may have to do with his low testosterone, for which he receives injections.  As above, deferring monitoring or stress testing at this time.  3.  HTN:  Stable to low.  4.  HL:  On statin.  He has been on a stable dose for many years.  Followed by primary care.  5.  DM:  Per PCP.  6.  Depression:  ? Role in Ss.  7.  Dispo:  F/u in 2-3 wks.  Rogelia Mire, NP 03/07/2014, 3:48 PM

## 2014-03-07 NOTE — Patient Instructions (Signed)
Please start Imdur 15mg  once daily.  Your physician recommends that you schedule a follow-up appointment in: 2-3 weeks with Dr. Rockey Situ.

## 2014-03-08 NOTE — Telephone Encounter (Signed)
Patient saw Ignacia Bayley NP in the office  He stated he bad experience with his last stress test  He would like to try the [facility] and see how that goes  Ignacia Bayley is aware

## 2014-03-09 ENCOUNTER — Other Ambulatory Visit: Payer: Self-pay | Admitting: *Deleted

## 2014-03-09 ENCOUNTER — Telehealth: Payer: Self-pay | Admitting: *Deleted

## 2014-03-09 MED ORDER — ISOSORBIDE MONONITRATE 15 MG HALF TABLET: 15.0000 mg | ORAL_TABLET | Freq: Every day | ORAL | Status: DC

## 2014-03-09 NOTE — Telephone Encounter (Signed)
Sounds OK to me.  ? Is he seeing me or Dr. Caryl Comes?  I did PCI - but was referred by Caryl Comes.  Leonie Man, MD

## 2014-03-09 NOTE — Telephone Encounter (Signed)
Error

## 2014-03-09 NOTE — Telephone Encounter (Signed)
Requested Prescriptions   Signed Prescriptions Disp Refills  . isosorbide mononitrate (IMDUR) 15 mg TB24 24 hr tablet 30 tablet 3    Sig: Take 0.5 tablets (15 mg total) by mouth daily.    Authorizing Provider: Rogelia Mire    Ordering User: Britt Bottom

## 2014-03-09 NOTE — Telephone Encounter (Signed)
Pharmacy mentioned that Rx was sent in for Imdur 15 mg. They don't have Imdur 15 mg only 20 mg and 30 mg and 30 mg is ER can't be cut in 1/2. Please advise.

## 2014-03-09 NOTE — Telephone Encounter (Signed)
Patient stated he is feeling much better this week  He would like to put off the Imdur and the stress test until he has his appt with Dr. Ellyn Hack 03/29/14 He stated he would call if he has any more issues with chest pain

## 2014-03-10 NOTE — Telephone Encounter (Signed)
He has a follow up with you.

## 2014-03-13 ENCOUNTER — Telehealth: Payer: Self-pay

## 2014-03-13 NOTE — Telephone Encounter (Signed)
Pt called and states he has had to take a "couple nitro, yesterday and the day before". States he currently has a "dull ache", states the pain is not disabling, in the middle of his chest. States he rode his tractor and feels somewhat better. Wants to talk about his rx for time released Nitro. Please call.

## 2014-03-13 NOTE — Telephone Encounter (Signed)
Go ahead & refill Imdur 30 mg - qhs (best taken ~30 min after Aspirin).  Leonie Man, MD

## 2014-03-13 NOTE — Telephone Encounter (Signed)
Patient had another episode of chest pain and wanted to let us know that he is ready to start his Imdur 30 mg daily

## 2014-03-14 MED ORDER — ISOSORBIDE MONONITRATE ER 30 MG PO TB24: 30.0000 mg | ORAL_TABLET | Freq: Every day | ORAL | Status: DC

## 2014-03-14 NOTE — Telephone Encounter (Signed)
i would have him increase imdur to 30  Also note that records suggested false negative stress tests so ...  Also has his major issue remains CAD and chest pain, it probably makes more sense, Johnny Kane, if youre ok with it to manage this primarily  thanks

## 2014-03-14 NOTE — Telephone Encounter (Signed)
Patient called and stated his pharmacy cancelled the RX and needed it resent  Resent RX to [facility]  Patient is aware

## 2014-03-14 NOTE — Telephone Encounter (Signed)
Imdur 30 is fine.  Thanks,  Leonie Man, MD

## 2014-03-17 ENCOUNTER — Telehealth: Payer: Self-pay

## 2014-03-17 NOTE — Telephone Encounter (Signed)
Patient stated he likes the Imdur but he thinks it might make him tired  He would like to split the 30 mg tablet and take 15 mg daily as Ignacia Bayley NP originally reccommended

## 2014-03-17 NOTE — Telephone Encounter (Signed)
Pt called to update his status on his new medication.

## 2014-03-17 NOTE — Telephone Encounter (Signed)
If IMDUR makes him tired -- that should make sense taking it @ night.  Can do 15 mg.  Leonie Man, MD

## 2014-03-17 NOTE — Telephone Encounter (Signed)
OK - sounds fine.   Never heard of IMDUR making anyone tired though.    Leonie Man, MD

## 2014-03-20 NOTE — Telephone Encounter (Signed)
Patient verbalized understanding  Patient stated he will stay on the 30 mg of Imdur

## 2014-03-22 ENCOUNTER — Ambulatory Visit: Payer: Medicare Other | Admitting: Cardiovascular Disease

## 2014-03-29 ENCOUNTER — Encounter: Payer: Self-pay | Admitting: Cardiology

## 2014-03-29 ENCOUNTER — Ambulatory Visit (INDEPENDENT_AMBULATORY_CARE_PROVIDER_SITE_OTHER): Payer: Medicare Other | Admitting: Cardiology

## 2014-03-29 ENCOUNTER — Encounter (HOSPITAL_COMMUNITY): Payer: Self-pay | Admitting: *Deleted

## 2014-03-29 VITALS — BP 128/82 | HR 81 | Ht 71.0 in | Wt 234.2 lb

## 2014-03-29 DIAGNOSIS — R0609 Other forms of dyspnea: Secondary | ICD-10-CM

## 2014-03-29 DIAGNOSIS — R5381 Other malaise: Secondary | ICD-10-CM

## 2014-03-29 DIAGNOSIS — I10 Essential (primary) hypertension: Secondary | ICD-10-CM

## 2014-03-29 DIAGNOSIS — Z9861 Coronary angioplasty status: Secondary | ICD-10-CM

## 2014-03-29 DIAGNOSIS — I2510 Atherosclerotic heart disease of native coronary artery without angina pectoris: Secondary | ICD-10-CM

## 2014-03-29 DIAGNOSIS — E785 Hyperlipidemia, unspecified: Secondary | ICD-10-CM

## 2014-03-29 DIAGNOSIS — R079 Chest pain, unspecified: Secondary | ICD-10-CM

## 2014-03-29 DIAGNOSIS — R5383 Other fatigue: Secondary | ICD-10-CM

## 2014-03-29 DIAGNOSIS — R0989 Other specified symptoms and signs involving the circulatory and respiratory systems: Secondary | ICD-10-CM

## 2014-03-29 NOTE — Patient Instructions (Addendum)
[place]  Your caregiver has ordered a Stress Test with nuclear imaging. The purpose of this test is to evaluate the blood supply to your heart muscle. This procedure is referred to as a "Non-Invasive Stress Test." This is because other than having an IV started in your vein, nothing is inserted or "invades" your body. Cardiac stress tests are done to find areas of poor blood flow to the heart by determining the extent of coronary artery disease (CAD). Some patients exercise on a treadmill, which naturally increases the blood flow to your heart, while others who are  unable to walk on a treadmill due to physical limitations have a pharmacologic/chemical stress agent called Lexiscan . This medicine will mimic walking on a treadmill by temporarily increasing your coronary blood flow.   Please note: these test may take anywhere between 2-4 hours to complete  This will be completed at our [facility]    Date of Procedure:___________06/10/15__________________________  Arrival Time for Procedure:_________7:45 am _____________________    Hold your Pravastatin until your follow up with Dr. Melody Haver can continue to take your Nitro as needed. If you have to take your nitro more than one day take Imdur 1.5 tablets for the following 3 days.       Your physician recommends that you schedule a follow-up appointment in: 6 weeks   You have been referred to Cardiac rehab

## 2014-03-31 ENCOUNTER — Telehealth: Payer: Self-pay | Admitting: *Deleted

## 2014-03-31 NOTE — Telephone Encounter (Signed)
Cardiac Rehab paperwork faxed 03/31/14

## 2014-04-04 ENCOUNTER — Encounter: Payer: Self-pay | Admitting: Cardiology

## 2014-04-04 DIAGNOSIS — R5383 Other fatigue: Secondary | ICD-10-CM | POA: Insufficient documentation

## 2014-04-04 DIAGNOSIS — E669 Obesity, unspecified: Secondary | ICD-10-CM | POA: Insufficient documentation

## 2014-04-04 NOTE — Assessment & Plan Note (Addendum)
Again not sure what to make of his discomfort as it is both exertional and nonexertional along with exertional dyspnea. He had echo done back in February that showed normal wall motion. He is on dual antiplatelet and statin. His also on nitrate and ARB. His beta blocker do to history of bradycardia and exercise intolerance.  He remains on aspirin plus Brilinta. I don't think that is dyspnea is related to Brilinta, just based on how he describes it. If the stress test is negative, we could consider switching him to a different antiplatelet agent such as Plavix or Effient.  He declined cardiac rehabilitation in the past, I think part of his issue may just simply be deconditioning.  I reiterated the importance of conditioning and therefore I think he may be willing to go forward pending the results of his stress test.

## 2014-04-04 NOTE — Progress Notes (Signed)
PCP: Leonides Sake, MD  Clinic Note: Chief Complaint  Patient presents with  . other    2-3 f/u c/o chest pain, sob, dizziness and edema ankles. Meds reviewed verbally with pt.    HPI: Johnny Kane is a 73 y.o. male with a Cardiovascular Problem List below who presents today for coronary artery disease / PCI.  Past Medical History  Diagnosis Date  . Hiatal hernia   . Diverticulosis of colon   . History of MRSA infection   . Hypertension   . Depression   . Hyperlipidemia   . OA (osteoarthritis)   . GERD (gastroesophageal reflux disease)   . Palpitations   . Vitamin B 12 deficiency   . Palpitations   . Sleep apnea     a. mild not on cpap   . Aortic valve sclerosis     a. 12/2013 Echo: Aortic Sclerosis; EF 60-65%, Gr1 DD, mild AI, mildly dil LA.  Marland Kitchen Aortic calcification     No evidence of AAA on CT Abd in 2013  . Low testosterone   . Diabetes mellitus without complication   . Vasovagal episode   . CAD S/P percutaneous coronary angioplasty     a. 07/2011 Nl MV;  b. 11/2013 Cath: LM nl, LAD 20p, D1/2 nl, LCX 30p, OM1 40, OM2 nl, RCA 90-4m (3.5x18 Xience DES).    Prior Cardiac Evaluation and Past Surgical History: Past Surgical History  Procedure Laterality Date  . Replacement total knee      one on each knee  . Transthoracic echocardiogram  February 2015    EF 60-65%, Gr1DD, mild AI with aortic sclerosis, mildly dilated LA.  . Tonsillectomy    . Cholecystectomy N/A 07/20/2013    Procedure: LAPAROSCOPIC CHOLECYSTECTOMY;  Surgeon: Joyice Faster. Cornett, MD;  Location: [facility];  Service: General;  Laterality: N/A;  . Coronary angioplasty  11/25/2013    DES   RCA     Interval History: Johnny Kane presents today now for a third followup visit after his RCA PCI done back in January.  He initially was a patient of Dr. Virl Axe, but has been transferred to my care for CAD after I performed his PCI. His initial symptoms were episodic chest discomfort and dyspnea on exertion as  well as episodic bradycardia.  He initially states that post PCI, he felt much better. Overall his energy level is significantly improved. However he still is having 19 general fatigue and is an intermittent chest pains several days.  Actually on the day of this visit, he really did not note any symptoms besides fatigue. Because he is having relief with nitroglycerin, we started him on Imdur. He saw Murray Hodgkins, NP-C a month ago and was noting symptoms. At that time the plan was to consider possibly having a stress test performed. He wanted to delay until he can talk with me. He notes that by late February he started having episodic discomfort in his chest, that was not necessarily always localized in the left chest. He had some epigastric, some right-sided as well as some shoulder discomfort. The have pretty much been constant along with fatigue. He noted early in the morning and then later on in the evening. His blood pressures tend to be a little low. He also notes brief intermittent palpitations.  From evening he notes his poor get up and go feeling tired and weak, but still notably improved since his PCI. He still has multiple discomforts intermittently but not necessarily exertional. He  also notes occasional dizziness with mild lower extremity edema  He denies any PND, orthopnea. No melena hematochezia or hematuria. He does have brief palpitations but no significant syncope/near syncope. No TIA or amaurosis fugax symptoms.  Current Outpatient Prescriptions on File Prior to Visit  Medication Sig Dispense Refill  . acetaminophen (TYLENOL) 325 MG tablet Take 2 tablets (650 mg total) by mouth every 4 (four) hours as needed for headache or mild pain.      Marland Kitchen aspirin 81 MG tablet Take 81 mg by mouth daily.        . citalopram (CELEXA) 40 MG tablet Take 40 mg by mouth daily.      . clindamycin (CLEOCIN) 300 MG capsule Take 300 mg by mouth as needed (dental procedures).       . famotidine (PEPCID)  20 MG tablet Take 20 mg by mouth daily.       . furosemide (LASIX) 20 MG tablet Take 1 tablet (20 mg total) by mouth every other day.  90 tablet  3  . isosorbide mononitrate (IMDUR) 30 MG 24 hr tablet Take 1 tablet (30 mg total) by mouth daily.  30 tablet  6  . nitroGLYCERIN (NITROSTAT) 0.4 MG SL tablet Place 1 tablet (0.4 mg total) under the tongue every 5 (five) minutes as needed for chest pain.  25 tablet  2  . olmesartan (BENICAR) 40 MG tablet Take 20 mg by mouth daily.      . Ticagrelor (BRILINTA) 90 MG TABS tablet Take 1 tablet (90 mg total) by mouth 2 (two) times daily.  60 tablet  11   No current facility-administered medications on file prior to visit.    ALLERGIES REVIEWED IN EPIC  History   Social History Narrative   Married father of 2. Retired from [employer].   Former smoker - quit in 1969 after 11 years; denies alcohol consumption     ROS: A comprehensive Review of Systems - Essentially the review of systems as noted in history of present illness. Otherwise negative.  PHYSICAL EXAM BP 128/82  Pulse 81  Ht 5\' 11"  (1.803 m)  Wt 234 lb 4 oz (106.255 kg)  BMI 32.69 kg/m2 General appearance: alert, cooperative, appears stated age, no distress and mildly obese; normal mood and affect. Neck: no adenopathy, no carotid bruit and no JVD Lungs: clear to auscultation bilaterally, normal percussion bilaterally and non-labored Heart: regular rate and rhythm, S1, S2 normal, no murmur, click, rub or gallop; nondisplaced PMI Abdomen: soft, non-tender; bowel sounds normal; no masses,  no organomegaly; Extremities: extremities normal, atraumatic, no cyanosis or edema Pulses: 2+ and symmetric; Neurologic: Mental status: Alert, oriented, thought content appropriate; moves all 4 extremities. Cranial nerves: normal (II-XII grossly intact)   Adult ECG Report  Rate: 81 ;  Rhythm: normal sinus rhythm and With PVCs; normal intervals and axis.   Narrative Interpretation: Otherwise normal  EKG  Recent Labs: None checked recently  ASSESSMENT / PLAN: 74 year old M. with multiple complaints today notably with single vessel CAD status post DES PCI to 95% early mid RCA.  Chest pain with moderate risk for cardiac etiology His initial symptoms he had been sent him for Relatively atypical in nature. A stoma in fact he does have nitroglycerin responsive symptoms and we know his anatomy, the question remains as his symptom related to microvascular were potentially microvascular ischemia.  Plan: LexiScan Myoview, continue current dose of Imdur, however if he does need to take an extra dose or 2 of nitroglycerin, he should  increase his Imdur to 1.5 tablets daily.  CAD S/P percutaneous coronary angioplasty - RCA DES 11/25/13  Again not sure what to make of his discomfort as it is both exertional and nonexertional along with exertional dyspnea. He had echo done back in February that showed normal wall motion. He is on dual antiplatelet and statin. His also on nitrate and ARB. His beta blocker do to history of bradycardia and exercise intolerance.  He remains on aspirin plus Brilinta. I don't think that is dyspnea is related to Brilinta, just based on how he describes it. If the stress test is negative, we could consider switching him to a different antiplatelet agent such as Plavix or Effient.  He declined cardiac rehabilitation in the past, I think part of his issue may just simply be deconditioning.  I reiterated the importance of conditioning and therefore I think he may be willing to go forward pending the results of his stress test.   DOE (dyspnea on exertion) Sure if this is related to coronary disease or not. It may simply be deconditioning.   Evaluate with nuclear stress test as noted.  Hypertension Well-controlled today. On ARB  Fatigue He is now on beta blocker, given medication it may potentially be leading to some aching and discomfort as well as fatigue is the statin.  Promus that is not usually the culprit for his symptoms, however we will have him stop it for a month to see if it improves his symptoms. If so I think need to try switching to a different statin.  Hyperlipidemia Currently on pravastatin. We will hold for a month. STEMI that is that his primary physician is checking his labs.   A total of 45-50 minutes was spent with the patient, greater than 50% was related to consultation and decision making.  Orders Placed This Encounter  Procedures  . Myocardial Perfusion Imaging    Standing Status: Future     Number of Occurrences:      Standing Expiration Date: 03/29/2015    Scheduling Instructions:     To be performed at [facility]    Order Specific Question:  Where should this test be performed    Answer:  [facility]    Order Specific Question:  Type of stress    Answer:  Lexiscan    Order Specific Question:  Patient weight in lbs    Answer:  234  . EKG 12-Lead    Order Specific Question:  Where should this test be performed    Answer:  [facility]   No orders of the defined types were placed in this encounter.    Followup: 6 weeks   DAVID W. Ellyn Hack, M.D., M.S. Interventional Cardiologist [facility]

## 2014-04-04 NOTE — Assessment & Plan Note (Signed)
His initial symptoms he had been sent him for Relatively atypical in nature. A stoma in fact he does have nitroglycerin responsive symptoms and we know his anatomy, the question remains as his symptom related to microvascular were potentially microvascular ischemia.  Plan: LexiScan Myoview, continue current dose of Imdur, however if he does need to take an extra dose or 2 of nitroglycerin, he should increase his Imdur to 1.5 tablets daily.

## 2014-04-04 NOTE — Assessment & Plan Note (Signed)
Well-controlled today. On ARB

## 2014-04-04 NOTE — Assessment & Plan Note (Signed)
Currently on pravastatin. We will hold for a month. STEMI that is that his primary physician is checking his labs.

## 2014-04-04 NOTE — Assessment & Plan Note (Signed)
Sure if this is related to coronary disease or not. It may simply be deconditioning.   Evaluate with nuclear stress test as noted.

## 2014-04-04 NOTE — Assessment & Plan Note (Signed)
He is now on beta blocker, given medication it may potentially be leading to some aching and discomfort as well as fatigue is the statin. Promus that is not usually the culprit for his symptoms, however we will have him stop it for a month to see if it improves his symptoms. If so I think need to try switching to a different statin.

## 2014-04-05 ENCOUNTER — Telehealth (HOSPITAL_COMMUNITY): Payer: Self-pay

## 2014-04-12 ENCOUNTER — Ambulatory Visit (HOSPITAL_COMMUNITY)
Admission: RE | Admit: 2014-04-12 | Discharge: 2014-04-12 | Disposition: A | Discharge status: Home / Self Care | Payer: Medicare Other | Source: Ambulatory Visit | Attending: Cardiovascular Disease | Admitting: Cardiovascular Disease

## 2014-04-12 DIAGNOSIS — R079 Chest pain, unspecified: Secondary | ICD-10-CM | POA: Insufficient documentation

## 2014-04-12 DIAGNOSIS — Z9861 Coronary angioplasty status: Secondary | ICD-10-CM | POA: Insufficient documentation

## 2014-04-12 DIAGNOSIS — R0989 Other specified symptoms and signs involving the circulatory and respiratory systems: Secondary | ICD-10-CM | POA: Insufficient documentation

## 2014-04-12 DIAGNOSIS — I739 Peripheral vascular disease, unspecified: Secondary | ICD-10-CM | POA: Insufficient documentation

## 2014-04-12 DIAGNOSIS — I2510 Atherosclerotic heart disease of native coronary artery without angina pectoris: Secondary | ICD-10-CM | POA: Insufficient documentation

## 2014-04-12 DIAGNOSIS — I4949 Other premature depolarization: Secondary | ICD-10-CM | POA: Insufficient documentation

## 2014-04-12 DIAGNOSIS — R5381 Other malaise: Secondary | ICD-10-CM | POA: Insufficient documentation

## 2014-04-12 DIAGNOSIS — I359 Nonrheumatic aortic valve disorder, unspecified: Secondary | ICD-10-CM | POA: Insufficient documentation

## 2014-04-12 DIAGNOSIS — E119 Type 2 diabetes mellitus without complications: Secondary | ICD-10-CM | POA: Insufficient documentation

## 2014-04-12 DIAGNOSIS — Z8249 Family history of ischemic heart disease and other diseases of the circulatory system: Secondary | ICD-10-CM | POA: Insufficient documentation

## 2014-04-12 DIAGNOSIS — R0609 Other forms of dyspnea: Secondary | ICD-10-CM | POA: Insufficient documentation

## 2014-04-12 DIAGNOSIS — R5383 Other fatigue: Secondary | ICD-10-CM

## 2014-04-12 DIAGNOSIS — Z87891 Personal history of nicotine dependence: Secondary | ICD-10-CM | POA: Insufficient documentation

## 2014-04-12 DIAGNOSIS — E669 Obesity, unspecified: Secondary | ICD-10-CM | POA: Insufficient documentation

## 2014-04-12 DIAGNOSIS — R42 Dizziness and giddiness: Secondary | ICD-10-CM | POA: Insufficient documentation

## 2014-04-12 MED ORDER — REGADENOSON 0.4 MG/5ML IV SOLN
0.4000 mg | Freq: Once | INTRAVENOUS | Status: AC
  Administered 2014-04-12: 0.4 mg via INTRAVENOUS

## 2014-04-12 MED ORDER — TECHNETIUM TC 99M SESTAMIBI GENERIC - CARDIOLITE
10.0000 | Freq: Once | INTRAVENOUS | Status: AC | PRN
  Administered 2014-04-12: 10 via INTRAVENOUS

## 2014-04-12 MED ORDER — TECHNETIUM TC 99M SESTAMIBI GENERIC - CARDIOLITE
30.0000 | Freq: Once | INTRAVENOUS | Status: AC | PRN
  Administered 2014-04-12: 30 via INTRAVENOUS

## 2014-04-12 MED ORDER — AMINOPHYLLINE 25 MG/ML IV SOLN
75.0000 mg | Freq: Once | INTRAVENOUS | Status: AC
  Administered 2014-04-12: 75 mg via INTRAVENOUS

## 2014-04-12 NOTE — Procedures (Addendum)
[name] [address] [place] [address] [phone]  Cardiology Nuclear Med Study  Johnny Kane is a 73 y.o. male     MRN : [id number]     DOB: [birth year]  Procedure Date: 04/12/2014  Nuclear Med Background Indication for Stress Test:  Evaluation for Ischemia, Abnormal EKG and PVC's History:  CAD;PTCA-11/25/2013;palpitaions;DVT of colon;Last NUC MPI on 07/22/2011-nonischemic;EF=65%;ECHO-12/23/2013-EF=60-65% Cardiac Risk Factors: Family History - CAD, History of Smoking, Hypertension, Lipids, NIDDM, Obesity, PVD and aortic valve sclerosis;aortic calcification;vasovagal response  Symptoms:  Chest Pain, Dizziness, DOE, Fatigue, Light-Headedness and weakness   Nuclear Pre-Procedure Caffeine/Decaff Intake:  7:00pm NPO After: 5:00am   IV Site: R Hand  IV 0.9% NS with Angio Cath:  22g  Chest Size (in):  52"  IV Started by: Rolene Course, RN  Height: 5\' 11"  (1.803 m)  Cup Size: n/a  BMI:  Body mass index is 32.65 kg/(m^2). Weight:  234 lb (106.142 kg)   Tech Comments:  n/a    Nuclear Med Study 1 or 2 day study: 1 day  Stress Test Type:  [place] Provider:  Glenetta Hew, MD   Resting Radionuclide: Technetium 59m Sestamibi  Resting Radionuclide Dose: 10.5 mCi   Stress Radionuclide:  Technetium 62m Sestamibi  Stress Radionuclide Dose: 30.1 mCi           Stress Protocol Rest HR: 71 Stress HR: 90  Rest BP: 145/86 Stress BP: 157/87  Exercise Time (min): n/a METS: n/a          Dose of Adenosine (mg):  n/a Dose of Lexiscan: 0.4 mg  Dose of Atropine (mg): n/a Dose of Dobutamine: n/a mcg/kg/min (at max HR)  Stress Test Technologist: Mellody Memos, CCT Nuclear Technologist: Otho Perl, CNMT   Rest Procedure:  Myocardial perfusion imaging was performed at rest 45 minutes following the intravenous administration of Technetium 70m Sestamibi. Stress Procedure:  The patient received IV Lexiscan 0.4 mg  over 15-seconds.  Technetium 72m Sestamibi injected IV at 30-seconds.  Patient experienced shortness of breath and was administered 75 mg of Aminophylline IV at 5 minutes. There were no significant changes with Lexiscan.  Quantitative spect images were obtained after a 45 minute delay.  Transient Ischemic Dilatation (Normal <1.22):  1.15 QGS EDV:  116 ml QGS ESV:  57 ml LV Ejection Fraction: 51%        Rest ECG: NSR - Normal EKG  Stress ECG: No significant change from baseline ECG  QPS Raw Data Images:  Normal; no motion artifact; normal heart/lung ratio. Stress Images:  Normal homogeneous uptake in all areas of the myocardium. Rest Images:  Normal homogeneous uptake in all areas of the myocardium. Subtraction (SDS):  No evidence of ischemia.  Impression Exercise Capacity:  Lexiscan with no exercise. BP Response:  Normal blood pressure response. Clinical Symptoms:  No significant symptoms noted. ECG Impression:  No significant ST segment change suggestive of ischemia. Comparison with Prior Nuclear Study: No significant change from previous study  Overall Impression:  Normal stress nuclear study.  LV Wall Motion:  NL LV Function; NL Wall Motion   Lorretta Harp, MD  04/12/2014 5:37 PM

## 2014-04-13 NOTE — Progress Notes (Signed)
Quick Note:  Good news: Myoview - no evidence of ischemia or infarction. --- No sign of heart artery blockages. Low Normal Pump function (EF)  Adel Burch W, MD  ______

## 2014-04-17 ENCOUNTER — Encounter (HOSPITAL_COMMUNITY): Payer: Self-pay | Admitting: Emergency Medicine

## 2014-04-17 ENCOUNTER — Emergency Department (INDEPENDENT_AMBULATORY_CARE_PROVIDER_SITE_OTHER)
Admission: EM | Admit: 2014-04-17 | Discharge: 2014-04-17 | Disposition: A | Discharge status: Home / Self Care | Payer: Medicare Other | Source: Home / Self Care | Attending: Emergency Medicine | Admitting: Emergency Medicine

## 2014-04-17 DIAGNOSIS — L0231 Cutaneous abscess of buttock: Secondary | ICD-10-CM

## 2014-04-17 DIAGNOSIS — IMO0002 Reserved for concepts with insufficient information to code with codable children: Secondary | ICD-10-CM

## 2014-04-17 DIAGNOSIS — L03317 Cellulitis of buttock: Secondary | ICD-10-CM

## 2014-04-17 MED ORDER — DOXYCYCLINE HYCLATE 100 MG PO TABS
ORAL_TABLET | ORAL | Status: AC
  Filled 2014-04-17: qty 2

## 2014-04-17 MED ORDER — LIDOCAINE HCL (PF) 1 % IJ SOLN
INTRAMUSCULAR | Status: AC
  Filled 2014-04-17: qty 5

## 2014-04-17 MED ORDER — DOXYCYCLINE HYCLATE 100 MG PO CAPS: 100.0000 mg | ORAL_CAPSULE | Freq: Two times a day (BID) | ORAL | Status: DC

## 2014-04-17 MED ORDER — CEFTRIAXONE SODIUM 1 G IJ SOLR
1.0000 g | Freq: Once | INTRAMUSCULAR | Status: AC
  Administered 2014-04-17: 1 g via INTRAMUSCULAR

## 2014-04-17 MED ORDER — CEFUROXIME AXETIL 500 MG PO TABS: 500.0000 mg | ORAL_TABLET | Freq: Two times a day (BID) | ORAL | Status: DC

## 2014-04-17 MED ORDER — CEFTRIAXONE SODIUM 1 G IJ SOLR
INTRAMUSCULAR | Status: AC
  Filled 2014-04-17: qty 10

## 2014-04-17 MED ORDER — DOXYCYCLINE HYCLATE 100 MG PO TABS
200.0000 mg | ORAL_TABLET | Freq: Once | ORAL | Status: AC
  Administered 2014-04-17: 200 mg via ORAL

## 2014-04-17 NOTE — ED Notes (Signed)
Patient c/o boil on buttock x 4 days. Patient reports hx of MRSA. Patient reports fever has been 101.3 x 3 days. Patient also reports he had a tick bite on Thursday. Patient is alert and oriented and in no acute distress.

## 2014-04-17 NOTE — Discharge Instructions (Signed)
Abscess An abscess is an infected area that contains a collection of pus and debris.It can occur in almost any part of the body. An abscess is also known as a furuncle or boil. CAUSES  An abscess occurs when tissue gets infected. This can occur from blockage of oil or sweat glands, infection of hair follicles, or a minor injury to the skin. As the body tries to fight the infection, pus collects in the area and creates pressure under the skin. This pressure causes pain. People with weakened immune systems have difficulty fighting infections and get certain abscesses more often.  SYMPTOMS Usually an abscess develops on the skin and becomes a painful mass that is red, warm, and tender. If the abscess forms under the skin, you may feel a moveable soft area under the skin. Some abscesses break open (rupture) on their own, but most will continue to get worse without care. The infection can spread deeper into the body and eventually into the bloodstream, causing you to feel ill.  DIAGNOSIS  Your caregiver will take your medical history and perform a physical exam. A sample of fluid may also be taken from the abscess to determine what is causing your infection. TREATMENT  Your caregiver may prescribe antibiotic medicines to fight the infection. However, taking antibiotics alone usually does not cure an abscess. Your caregiver may need to make a small cut (incision) in the abscess to drain the pus. In some cases, gauze is packed into the abscess to reduce pain and to continue draining the area. HOME CARE INSTRUCTIONS   Only take over-the-counter or prescription medicines for pain, discomfort, or fever as directed by your caregiver.  If you were prescribed antibiotics, take them as directed. Finish them even if you start to feel better.  If gauze is used, follow your caregiver's directions for changing the gauze.  To avoid spreading the infection:  Keep your draining abscess covered with a  bandage.  Wash your hands well.  Do not share personal care items, towels, or whirlpools with others.  Avoid skin contact with others.  Keep your skin and clothes clean around the abscess.  Keep all follow-up appointments as directed by your caregiver. SEEK MEDICAL CARE IF:   You have increased pain, swelling, redness, fluid drainage, or bleeding.  You have muscle aches, chills, or a general ill feeling.  You have a fever. MAKE SURE YOU:   Understand these instructions.  Will watch your condition.  Will get help right away if you are not doing well or get worse. Document Released: 07/30/2005 Document Revised: 04/20/2012 Document Reviewed: 01/02/2012 [facility] Patient Information 2014 [place].  Cellulitis Cellulitis is an infection of the skin and the tissue beneath it. The infected area is usually red and tender. Cellulitis occurs most often in the arms and lower legs.  CAUSES  Cellulitis is caused by bacteria that enter the skin through cracks or cuts in the skin. The most common types of bacteria that cause cellulitis are Staphylococcus and Streptococcus. SYMPTOMS   Redness and warmth.  Swelling.  Tenderness or pain.  Fever. DIAGNOSIS  Your caregiver can usually determine what is wrong based on a physical exam. Blood tests may also be done. TREATMENT  Treatment usually involves taking an antibiotic medicine. HOME CARE INSTRUCTIONS   Take your antibiotics as directed. Finish them even if you start to feel better.  Keep the infected arm or leg elevated to reduce swelling.  Apply a warm cloth to the affected area up to  4 times per day to relieve pain.  Only take over-the-counter or prescription medicines for pain, discomfort, or fever as directed by your caregiver.  Keep all follow-up appointments as directed by your caregiver. SEEK MEDICAL CARE IF:   You notice red streaks coming from the infected area.  Your red area gets larger or turns dark in  color.  Your bone or joint underneath the infected area becomes painful after the skin has healed.  Your infection returns in the same area or another area.  You notice a swollen bump in the infected area.  You develop new symptoms. SEEK IMMEDIATE MEDICAL CARE IF:   You have a fever.  You feel very sleepy.  You develop vomiting or diarrhea.  You have a general ill feeling (malaise) with muscle aches and pains. MAKE SURE YOU:   Understand these instructions.  Will watch your condition.  Will get help right away if you are not doing well or get worse. Document Released: 07/30/2005 Document Revised: 04/20/2012 Document Reviewed: 01/05/2012 [facility] Patient Information 2014 [place].

## 2014-04-17 NOTE — ED Provider Notes (Signed)
CSN: [id number]     Arrival date & time 04/17/14  1000 History   First MD Initiated Contact with Patient 04/17/14 1031     Chief Complaint  Patient presents with  . Abscess   (Consider location/radiation/quality/duration/timing/severity/associated sxs/prior Treatment) HPI Comments: 73 year old male presents for evaluation of an abscess on his right buttock for about 4 days. He started to have pain, pressure, and swelling that has gotten progressively worse. He also states she has had a fever up to 101F at night for the past 3 nights. He has had MRSA abscesses before and worries that he may have another one. He also was bitten by a tick 5 days ago. He denies any fever currently, dizziness, lightheadedness, nausea, vomiting. He has had some drainage from the abscess  Patient is a 73 y.o. male presenting with abscess.  Abscess Associated symptoms: fever     Past Medical History  Diagnosis Date  . Hiatal hernia   . Diverticulosis of colon   . History of MRSA infection   . Hypertension   . Depression   . Hyperlipidemia   . OA (osteoarthritis)   . GERD (gastroesophageal reflux disease)   . Palpitations   . Vitamin B 12 deficiency   . Palpitations   . Sleep apnea     a. mild not on cpap   . Aortic valve sclerosis     a. 12/2013 Echo: Aortic Sclerosis; EF 60-65%, Gr1 DD, mild AI, mildly dil LA.  Marland Kitchen Aortic calcification     No evidence of AAA on CT Abd in 2013  . Low testosterone   . Diabetes mellitus without complication   . Vasovagal episode   . CAD S/P percutaneous coronary angioplasty     a. 07/2011 Nl MV;  b. 11/2013 Cath: LM nl, LAD 20p, D1/2 nl, LCX 30p, OM1 40, OM2 nl, RCA 90-31m (3.5x18 Xience DES).   Past Surgical History  Procedure Laterality Date  . Replacement total knee      one on each knee  . Transthoracic echocardiogram  February 2015    EF 60-65%, Gr1DD, mild AI with aortic sclerosis, mildly dilated LA.  . Tonsillectomy    . Cholecystectomy N/A 07/20/2013   Procedure: LAPAROSCOPIC CHOLECYSTECTOMY;  Surgeon: Joyice Faster. Cornett, MD;  Location: [facility];  Service: General;  Laterality: N/A;  . Coronary angioplasty  11/25/2013    DES   RCA     Family History  Problem Relation Age of Onset  . Asthma Mother   . Hypertension Mother   . Heart attack Father    History  Substance Use Topics  . Smoking status: Former Smoker -- 11 years    Quit date: 11/04/1967  . Smokeless tobacco: Never Used  . Alcohol Use: No    Review of Systems  Constitutional: Positive for fever and chills.  Skin: Positive for rash (Around the tick bite on the lower right abdomen ) and wound (see history of present illness).  Psychiatric/Behavioral: Positive for confusion (last night, no more today).  All other systems reviewed and are negative.   Allergies  Ciprofloxacin; Esomeprazole magnesium; Penicillins; Soma; and Sulfa drugs cross reactors  Home Medications   Prior to Admission medications   Medication Sig Start Date End Date Taking? Authorizing Provider  acetaminophen (TYLENOL) 325 MG tablet Take 2 tablets (650 mg total) by mouth every 4 (four) hours as needed for headache or mild pain. 11/26/13   Erlene Quan, PA-C  aspirin 81 MG tablet Take 81 mg by mouth  daily.      Historical Provider, MD  cefUROXime (CEFTIN) 500 MG tablet Take 1 tablet (500 mg total) by mouth 2 (two) times daily with a meal. 04/17/14   Liam Graham, PA-C  citalopram (CELEXA) 40 MG tablet Take 40 mg by mouth daily.    Historical Provider, MD  clindamycin (CLEOCIN) 300 MG capsule Take 300 mg by mouth as needed (dental procedures).  07/28/13   Historical Provider, MD  doxycycline (VIBRAMYCIN) 100 MG capsule Take 1 capsule (100 mg total) by mouth 2 (two) times daily. 04/17/14   Liam Graham, PA-C  famotidine (PEPCID) 20 MG tablet Take 20 mg by mouth daily.  06/04/13   Historical Provider, MD  furosemide (LASIX) 20 MG tablet Take 1 tablet (20 mg total) by mouth every other day. 01/06/14   Deboraha Sprang, MD  isosorbide mononitrate (IMDUR) 30 MG 24 hr tablet Take 1 tablet (30 mg total) by mouth daily. 03/14/14   Leonie Man, MD  nitroGLYCERIN (NITROSTAT) 0.4 MG SL tablet Place 1 tablet (0.4 mg total) under the tongue every 5 (five) minutes as needed for chest pain. 11/26/13   Erlene Quan, PA-C  olmesartan (BENICAR) 40 MG tablet Take 20 mg by mouth daily.    Historical Provider, MD  Ticagrelor (BRILINTA) 90 MG TABS tablet Take 1 tablet (90 mg total) by mouth 2 (two) times daily. 11/26/13   Luke K Kilroy, PA-C   BP 131/66  Pulse 92  Temp(Src) 98.5 F (36.9 C) (Oral)  Resp 16  SpO2 100% Physical Exam  Nursing note and vitals reviewed. Constitutional: He is oriented to person, place, and time. He appears well-developed and well-nourished. No distress.  HENT:  Head: Normocephalic.  Pulmonary/Chest: Effort normal. No respiratory distress.  Neurological: He is alert and oriented to person, place, and time. Coordination normal.  Skin: Skin is warm and dry. He is not diaphoretic.  On the right buttock at the gluteal cleft, and there is an abscess with a draining tract. There is an 8 cm area of erythema and induration surrounding this.  Psychiatric: He has a normal mood and affect. Judgment normal.    ED Course  INCISION AND DRAINAGE Date/Time: 04/17/2014 11:10 AM Performed by: Allena Katz, H Authorized by: Philipp Deputy C Consent: Verbal consent obtained. Risks and benefits: risks, benefits and alternatives were discussed Consent given by: patient Patient identity confirmed: verbally with patient Time out: Immediately prior to procedure a "time out" was called to verify the correct patient, procedure, equipment, support staff and site/side marked as required. Type: abscess Body area: anogenital Location details: gluteal cleft Anesthesia: local infiltration Local anesthetic: lidocaine 2% with epinephrine Anesthetic total: 8 ml Patient sedated: no Scalpel size: 11 Incision  type: single straight Complexity: simple Drainage: purulent and  bloody Drainage amount: copious Wound treatment: drain placed Packing material: 1/4 in iodoform gauze Patient tolerance: Patient tolerated the procedure well with no immediate complications.   (including critical care time) Labs Review Labs Reviewed - No data to display  Imaging Review No results found.   MDM   1. Abscess of cellulitis of buttock    Patient given 1 g of Rocephin and 200 mg oral doxycycline here. Will discharge with doxycycline and Ceftin, both twice a day he'll followup in 48 hours for a recheck. He will go to the emergency department if worsening.   Meds ordered this encounter  Medications  . doxycycline (VIBRAMYCIN) 100 MG capsule    Sig: Take 1 capsule (  100 mg total) by mouth 2 (two) times daily.    Dispense:  20 capsule    Refill:  0    Order Specific Question:  Supervising Provider    Answer:  Jake Michaelis, DAVID C [id number]  . cefTRIAXone (ROCEPHIN) injection 1 g    Sig:   . doxycycline (VIBRA-TABS) tablet 200 mg    Sig:   . cefUROXime (CEFTIN) 500 MG tablet    Sig: Take 1 tablet (500 mg total) by mouth 2 (two) times daily with a meal.    Dispense:  20 tablet    Refill:  0    Order Specific Question:  Supervising Provider    Answer:  Jake Michaelis, DAVID C [[id number]]       Liam Graham, PA-C 04/17/14 1111

## 2014-04-19 ENCOUNTER — Emergency Department (INDEPENDENT_AMBULATORY_CARE_PROVIDER_SITE_OTHER)
Admission: EM | Admit: 2014-04-19 | Discharge: 2014-04-19 | Disposition: A | Discharge status: Home / Self Care | Payer: Medicare Other | Source: Home / Self Care | Attending: Family Medicine | Admitting: Family Medicine

## 2014-04-19 ENCOUNTER — Encounter (HOSPITAL_COMMUNITY): Payer: Self-pay | Admitting: Emergency Medicine

## 2014-04-19 DIAGNOSIS — Z4800 Encounter for change or removal of nonsurgical wound dressing: Secondary | ICD-10-CM

## 2014-04-19 DIAGNOSIS — Z09 Encounter for follow-up examination after completed treatment for conditions other than malignant neoplasm: Secondary | ICD-10-CM

## 2014-04-19 NOTE — ED Provider Notes (Signed)
Medical screening examination/treatment/procedure(s) were performed by non-physician practitioner and as supervising physician I was immediately available for consultation/collaboration.  Philipp Deputy, M.D.  Harden Mo, MD 04/19/14 [phone]

## 2014-04-19 NOTE — ED Notes (Signed)
followup  abcess  Pt  Doing  better

## 2014-04-19 NOTE — ED Provider Notes (Signed)
Medical screening examination/treatment/procedure(s) were performed by a resident physician or non-physician practitioner and as the supervising physician I was immediately available for consultation/collaboration.  Lynne Leader, MD    Gregor Hams, MD 04/19/14 2005

## 2014-04-19 NOTE — ED Provider Notes (Signed)
CSN: [id number]     Arrival date & time 04/19/14  2595 History   First MD Initiated Contact with Patient 04/19/14 [phone]     No chief complaint on file.  (Consider location/radiation/quality/duration/timing/severity/associated sxs/prior Treatment) Patient is a 73 y.o. male presenting with wound check. The history is provided by the patient. No language interpreter was used.  Wound Check This is a new problem. The current episode started more than 2 days ago. The problem has been gradually improving. Nothing aggravates the symptoms. Nothing relieves the symptoms. He has tried nothing for the symptoms. The treatment provided no relief.    Past Medical History  Diagnosis Date  . Hiatal hernia   . Diverticulosis of colon   . History of MRSA infection   . Hypertension   . Depression   . Hyperlipidemia   . OA (osteoarthritis)   . GERD (gastroesophageal reflux disease)   . Palpitations   . Vitamin B 12 deficiency   . Palpitations   . Sleep apnea     a. mild not on cpap   . Aortic valve sclerosis     a. 12/2013 Echo: Aortic Sclerosis; EF 60-65%, Gr1 DD, mild AI, mildly dil LA.  Marland Kitchen Aortic calcification     No evidence of AAA on CT Abd in 2013  . Low testosterone   . Diabetes mellitus without complication   . Vasovagal episode   . CAD S/P percutaneous coronary angioplasty     a. 07/2011 Nl MV;  b. 11/2013 Cath: LM nl, LAD 20p, D1/2 nl, LCX 30p, OM1 40, OM2 nl, RCA 90-43m (3.5x18 Xience DES).   Past Surgical History  Procedure Laterality Date  . Replacement total knee      one on each knee  . Transthoracic echocardiogram  February 2015    EF 60-65%, Gr1DD, mild AI with aortic sclerosis, mildly dilated LA.  . Tonsillectomy    . Cholecystectomy N/A 07/20/2013    Procedure: LAPAROSCOPIC CHOLECYSTECTOMY;  Surgeon: Joyice Faster. Cornett, MD;  Location: [facility];  Service: General;  Laterality: N/A;  . Coronary angioplasty  11/25/2013    DES   RCA     Family History  Problem Relation Age of Onset   . Asthma Mother   . Hypertension Mother   . Heart attack Father    History  Substance Use Topics  . Smoking status: Former Smoker -- 11 years    Quit date: 11/04/1967  . Smokeless tobacco: Never Used  . Alcohol Use: No    Review of Systems  Constitutional: Positive for fever.  All other systems reviewed and are negative.   Allergies  Ciprofloxacin; Esomeprazole magnesium; Penicillins; Soma; and Sulfa drugs cross reactors  Home Medications   Prior to Admission medications   Medication Sig Start Date End Date Taking? Authorizing Provider  acetaminophen (TYLENOL) 325 MG tablet Take 2 tablets (650 mg total) by mouth every 4 (four) hours as needed for headache or mild pain. 11/26/13   Erlene Quan, PA-C  aspirin 81 MG tablet Take 81 mg by mouth daily.      Historical Provider, MD  cefUROXime (CEFTIN) 500 MG tablet Take 1 tablet (500 mg total) by mouth 2 (two) times daily with a meal. 04/17/14   Liam Graham, PA-C  citalopram (CELEXA) 40 MG tablet Take 40 mg by mouth daily.    Historical Provider, MD  clindamycin (CLEOCIN) 300 MG capsule Take 300 mg by mouth as needed (dental procedures).  07/28/13   Historical Provider, MD  doxycycline (VIBRAMYCIN) 100 MG capsule Take 1 capsule (100 mg total) by mouth 2 (two) times daily. 04/17/14   Liam Graham, PA-C  famotidine (PEPCID) 20 MG tablet Take 20 mg by mouth daily.  06/04/13   Historical Provider, MD  furosemide (LASIX) 20 MG tablet Take 1 tablet (20 mg total) by mouth every other day. 01/06/14   Deboraha Sprang, MD  isosorbide mononitrate (IMDUR) 30 MG 24 hr tablet Take 1 tablet (30 mg total) by mouth daily. 03/14/14   Leonie Man, MD  nitroGLYCERIN (NITROSTAT) 0.4 MG SL tablet Place 1 tablet (0.4 mg total) under the tongue every 5 (five) minutes as needed for chest pain. 11/26/13   Erlene Quan, PA-C  olmesartan (BENICAR) 40 MG tablet Take 20 mg by mouth daily.    Historical Provider, MD  Ticagrelor (BRILINTA) 90 MG TABS tablet Take 1  tablet (90 mg total) by mouth 2 (two) times daily. 11/26/13   Doreene Burke Kilroy, PA-C   BP 121/68  Pulse 80  Temp(Src) 97.8 F (36.6 C) (Oral)  Resp 16  SpO2 96% Physical Exam  Nursing note and vitals reviewed. Constitutional: He is oriented to person, place, and time. He appears well-developed and well-nourished.  HENT:  Head: Normocephalic.  Eyes: Conjunctivae and EOM are normal. Pupils are equal, round, and reactive to light.  Neck: Normal range of motion.  Pulmonary/Chest: Effort normal.  Abdominal: He exhibits no distension.  Musculoskeletal: Normal range of motion.  Neurological: He is alert and oriented to person, place, and time.  Skin: Skin is warm. There is erythema.  Psychiatric: He has a normal mood and affect.  healing abscess right buttock  ED Course  Procedures (including critical care time) Labs Review Labs Reviewed - No data to display  Imaging Review No results found.   MDM   1. Encounter for recheck of abscess following incision and drainage       Fransico Meadow, PA-C 04/19/14 1008

## 2014-04-20 ENCOUNTER — Telehealth: Payer: Self-pay

## 2014-04-20 LAB — CULTURE, ROUTINE-ABSCESS: Gram Stain: NONE SEEN

## 2014-04-20 NOTE — ED Notes (Signed)
Abscess culture R buttock: Mod. MRSA.  Pt. adequately treated with Doxycycline and Ceftin.  I will notify pt. Roselyn Meier 04/20/2014

## 2014-04-20 NOTE — Telephone Encounter (Signed)
Pt wanted [place] to know that he has had some non cardiac health issues this week  Even under that stress he has been chest pain free He stated that he thinks his current medication regimen is starting to work well for him I thanked him for letting us know and told him call if he has anymore chest pain episodes

## 2014-04-20 NOTE — Telephone Encounter (Signed)
Good news.    Keep up current regimen. Leonie Man, MD

## 2014-04-20 NOTE — Telephone Encounter (Signed)
Pt called and wanted to let us know he had some things going on this week, not urgent and would like a call back.

## 2014-04-22 NOTE — ED Notes (Signed)
I called pt. Pt. verified x 2 and given result.  Pt. told he is adequately treated with Doxycyline and Ceftin.  Pt. said he has had MRSA before and knows the instructions.  Pt.instructed to get rechecked if not better after the medication.  Pt. voiced understanding. Me 04/22/2014

## 2014-05-12 ENCOUNTER — Telehealth: Payer: Self-pay

## 2014-05-12 NOTE — Telephone Encounter (Signed)
We can definitely convert over to Plavix.   Does not need to wait for clinic visit --just start Plavix in place of Brilinta at end of current bottle.  We will need to check a P2Y12 Assay after 2 weeks of Plavix to make sure that he is a "responder".  Leonie Man, M.D., M.S. Interventional Cardiologist   Pager # [phone] 05/12/2014

## 2014-05-12 NOTE — Telephone Encounter (Signed)
Pt states he has taken 1 nitro today, and states he is having side effects from his blood thinner.states he is very sleepy and sob. Please call.

## 2014-05-12 NOTE — Telephone Encounter (Signed)
Patient wanted to let Dr. Ellyn Hack know that he might want to switch to Plavix because he thinks his Brilinta is causing shortness of breath  He will talk to Dr. Ellyn Hack about this at his next visit  He stated that he has also had to take his nitro "a couple of times recently"  He as also been feeling a "little sleepy during the day" Patient states he feels fine at the current moment   Informed patient that I will forward this information to Dr. Francee Gentile him to contact EMS if his situation becomes emergent

## 2014-05-15 NOTE — Telephone Encounter (Signed)
OK 

## 2014-05-15 NOTE — Telephone Encounter (Signed)
Patient would like to discuss these medications with Dr. Ellyn Hack at his visit on 7/15 before making the change

## 2014-05-17 ENCOUNTER — Encounter: Payer: Self-pay | Admitting: Cardiology

## 2014-05-17 ENCOUNTER — Ambulatory Visit (INDEPENDENT_AMBULATORY_CARE_PROVIDER_SITE_OTHER): Payer: Medicare Other | Admitting: Cardiology

## 2014-05-17 VITALS — BP 122/60 | HR 90 | Ht 69.0 in | Wt 232.5 lb

## 2014-05-17 DIAGNOSIS — I10 Essential (primary) hypertension: Secondary | ICD-10-CM

## 2014-05-17 DIAGNOSIS — R0609 Other forms of dyspnea: Secondary | ICD-10-CM

## 2014-05-17 DIAGNOSIS — R0989 Other specified symptoms and signs involving the circulatory and respiratory systems: Secondary | ICD-10-CM

## 2014-05-17 DIAGNOSIS — R0602 Shortness of breath: Secondary | ICD-10-CM

## 2014-05-17 DIAGNOSIS — E669 Obesity, unspecified: Secondary | ICD-10-CM

## 2014-05-17 DIAGNOSIS — R079 Chest pain, unspecified: Secondary | ICD-10-CM

## 2014-05-17 DIAGNOSIS — Z9861 Coronary angioplasty status: Secondary | ICD-10-CM

## 2014-05-17 DIAGNOSIS — E785 Hyperlipidemia, unspecified: Secondary | ICD-10-CM

## 2014-05-17 DIAGNOSIS — I2510 Atherosclerotic heart disease of native coronary artery without angina pectoris: Secondary | ICD-10-CM

## 2014-05-17 MED ORDER — ISOSORBIDE MONONITRATE ER 60 MG PO TB24: 60.0000 mg | ORAL_TABLET | Freq: Every day | ORAL | Status: DC

## 2014-05-17 NOTE — Patient Instructions (Signed)
Your physician has recommended you make the following change in your medication:  Stop Aspirin  Increase Imdur 60 mg once daily   Your physician wants you to follow-up in: 6 months. You will receive a reminder letter in the mail two months in advance. If you don't receive a letter, please call our office to schedule the follow-up appointment.

## 2014-05-18 NOTE — Telephone Encounter (Signed)
Encounter complete. 

## 2014-05-24 ENCOUNTER — Encounter: Payer: Self-pay | Admitting: Cardiology

## 2014-05-24 NOTE — Assessment & Plan Note (Signed)
Negative Myoview. I think he is having some mild bruising so it would be acceptable for him to stop aspirin. Otherwise he is on an ARB, statin and Brilinta. Nominal walker for reasons noted above.

## 2014-05-24 NOTE — Assessment & Plan Note (Signed)
Well-controlled on ARB. 

## 2014-05-24 NOTE — Assessment & Plan Note (Signed)
We had held his progress that and see if that would help his cramps. He really didn't seem to make much of a difference. We'll go ahead and restart the statin. PCP is checking his lipids in routine followup.

## 2014-05-24 NOTE — Progress Notes (Signed)
PCP: Leonides Sake, MD  Clinic Note: Chief Complaint  Patient presents with  . other    6 week follow up. Pt. c/o chest pain & shortness of breath. Meds reviewed by the patient verbally.     HPI: Johnny Kane is a 73 y.o. male with a Cardiovascular Problem List below who presents today for x-ray followup of his CAD PCI with recurrent chronic angina. I saw him in late My, and at that time ordered a Myoview/Cardiolite Nuclear Stress Test to ensure there was no evidence of ischemia. As noted below, there was no evidence of ischemia or infarction with low normal ejection fraction.  Interval History: Since his last visit, he says that the medics estimated that he is now down to be moderate. He is also starting to become more active and this seemed to be interested in going to cardiac rehabilitation. Overall, he says most of his symptoms are exertional but can also be at the end of the day when he is tired or lying down. When he does take the increased dose of Imdur -- plan at the last visit for him to take increased dose of Imdur after taking nitroglycerin -- the symptoms are much improved. He is able actually become more active. He still has aches and pains but did not notice a significant improvement being off of Pravachol. He still has some mild fatigue and exertional dyspnea but is notably improved.  Cardiovascular ROS: positive for - dyspnea on exertion, palpitations and Mild exertional chest discomfort as noted. negative for - edema, murmur, orthopnea, paroxysmal nocturnal dyspnea, rapid heart rate or Resting shortness of breath, TIA/amaurosis fugax, syncope/near syncope; melena, hematochezia, hematuria, epistaxis; claudication,:  Past Medical History  Diagnosis Date  . Hiatal hernia   . Diverticulosis of colon   . History of MRSA infection   . Hypertension   . Depression   . Hyperlipidemia   . OA (osteoarthritis)   . GERD (gastroesophageal reflux disease)   . Palpitations   .  Vitamin B 12 deficiency   . Palpitations   . Sleep apnea     a. mild not on cpap   . Aortic valve sclerosis     a. 12/2013 Echo: Aortic Sclerosis; EF 60-65%, Gr1 DD, mild AI, mildly dil LA.  Marland Kitchen Aortic calcification     No evidence of AAA on CT Abd in 2013  . Low testosterone   . Diabetes mellitus without complication   . Vasovagal episode   . CAD S/P percutaneous coronary angioplasty     a. 07/2011 Nl MV;  b. 11/2013 Cath: LM nl, LAD 20p, D1/2 nl, LCX 30p, OM1 40, OM2 nl, RCA 90-4m (3.5x18 Xience DES). c. Myoview 04/2014: No ischmia/Infarction, normal EF.    Prior Cardiac Evaluation and Past Surgical History: Past Surgical History  Procedure Laterality Date  . Replacement total knee      one on each knee  . Transthoracic echocardiogram  February 2015    EF 60-65%, Gr1DD, mild AI with aortic sclerosis, mildly dilated LA.  . Tonsillectomy    . Cholecystectomy N/A 07/20/2013    Procedure: LAPAROSCOPIC CHOLECYSTECTOMY;  Surgeon: Joyice Faster. Cornett, MD;  Location: [facility];  Service: General;  Laterality: N/A;  . Coronary angioplasty  11/25/2013    DES   RCA      MEDICATIONS AND ALLERGIES REVIEWED IN EPIC No Change in Social and Family History  ROS: A comprehensive Review of Systems - Negative except symptoms noted in HPI.  Wt Readings  from Last 3 Encounters:  05/17/14 232 lb 8 oz (105.461 kg)  04/12/14 234 lb (106.142 kg)  03/29/14 234 lb 4 oz (106.255 kg)   PHYSICAL EXAM BP 122/60  Pulse 90  Ht 5\' 9"  (1.753 m)  Wt 232 lb 8 oz (105.461 kg)  BMI 34.32 kg/m2 General appearance: alert, cooperative, appears stated age, no distress and mildly obese; normal mood and affect.  Neck: no adenopathy, no carotid bruit and no JVD  Lungs: clear to auscultation bilaterally, normal percussion bilaterally and non-labored  Heart: regular rate and rhythm, S1, S2 normal, no murmur, click, rub or gallop; nondisplaced PMI  Abdomen: soft, non-tender; bowel sounds normal; no masses, no organomegaly;    Extremities: extremities normal, atraumatic, no cyanosis or edema  Pulses: 2+ and symmetric;  Neurologic: Mental status: Alert, oriented, thought content appropriate; moves all 4 extremities.  Cranial nerves: normal (II-XII grossly intact)   Adult ECG Report  Rate: 90 ;  Rhythm: normal sinus rhythm, premature ventricular contractions (PVC) and and fusion beats  Narrative Interpretation: Borderline EKG with no ischemic changes.  Recent Labs : None   ASSESSMENT / PLAN: Chest pain with moderate risk for cardiac etiology It's hard to be certain what his actual symptoms are related to. His presenting symptoms that led to him having heart catheterization were atypical at best. The back of the Myoview was negative for any macrovascular ischemia would suggest that it is potentially noncardiac. However it was improved with nitroglycerin which would suggest potentially microvascular ischemia.  He is not on a beta blocker due to bradycardia issues he and he is on an ARB, but no symptoms improving he had was from the increased dose of Imdur. But then increasing to 60 mg daily and given the go ahead to take an additional one half dose if he has to use sublingual nitroglycerin glycerin in the interim. I plan to continue to uptitrate the Imdur as long as it is helping his symptoms. He would prefer not to have the financial burden of Ranexa.  CAD S/P percutaneous coronary angioplasty - RCA DES 11/25/13 Negative Myoview. I think he is having some mild bruising so it would be acceptable for him to stop aspirin. Otherwise he is on an ARB, statin and Brilinta. Nominal walker for reasons noted above.  Essential hypertension Well-controlled on ARB  DOE (dyspnea on exertion) This is probably in no small part due to deconditioning. I had a long talk with him and his wife are both agreeable to try to make time for him to go to cardiac rehabilitation. Hopefully by gradually building his activity level up, he will  gain more conditioning and had less exertional dyspnea. Weight loss will be crucial here.  Obesity (BMI 30-39.9) As noted above. We talked about dietary modifications and increasing exercise. I think the educational aspect of cardiac rehabilitation will be also important is the physical activity portion  Hyperlipidemia with target LDL less than 70 We had held his progress that and see if that would help his cramps. He really didn't seem to make much of a difference. We'll go ahead and restart the statin. PCP is checking his lipids in routine followup.    Orders Placed This Encounter  Procedures  . EKG 12-Lead   Meds ordered this encounter  Medications  . modafinil (PROVIGIL) 100 MG tablet    Sig: Take 100 mg by mouth daily.  . isosorbide mononitrate (IMDUR) 60 MG 24 hr tablet    Sig: Take 1 tablet (60 mg  total) by mouth daily.    Dispense:  90 tablet    Refill:  3    Followup: 6 months  Jeymi Hepp W. Ellyn Hack, M.D., M.S. Interventional Cardiologist [facility]

## 2014-05-24 NOTE — Assessment & Plan Note (Signed)
As noted above. We talked about dietary modifications and increasing exercise. I think the educational aspect of cardiac rehabilitation will be also important is the physical activity portion

## 2014-05-24 NOTE — Assessment & Plan Note (Addendum)
It's hard to be certain what his actual symptoms are related to. His presenting symptoms that led to him having heart catheterization were atypical at best. The back of the Myoview was negative for any macrovascular ischemia would suggest that it is potentially noncardiac. However it was improved with nitroglycerin which would suggest potentially microvascular ischemia.  He is not on a beta blocker due to bradycardia issues he and he is on an ARB, but no symptoms improving he had was from the increased dose of Imdur. But then increasing to 60 mg daily and given the go ahead to take an additional one half dose if he has to use sublingual nitroglycerin glycerin in the interim. I plan to continue to uptitrate the Imdur as long as it is helping his symptoms. He would prefer not to have the financial burden of Ranexa.

## 2014-05-24 NOTE — Assessment & Plan Note (Signed)
This is probably in no small part due to deconditioning. I had a long talk with him and his wife are both agreeable to try to make time for him to go to cardiac rehabilitation. Hopefully by gradually building his activity level up, he will gain more conditioning and had less exertional dyspnea. Weight loss will be crucial here.

## 2014-08-02 ENCOUNTER — Other Ambulatory Visit (HOSPITAL_COMMUNITY): Payer: Self-pay | Admitting: Cardiology

## 2014-08-02 NOTE — Telephone Encounter (Signed)
Spoke with patient - he reports having to take NTGx1 today - going to cardiac rehab today - he is having chest pain related to stress at home. He will continue to monitor pain and was advised to contact provider's office should his symptoms worsen or he start to have to take more NTG to obtain relief.   Rx was sent to pharmacy electronically.

## 2014-10-12 ENCOUNTER — Encounter (HOSPITAL_COMMUNITY): Payer: Self-pay | Admitting: Cardiology

## 2014-11-13 DIAGNOSIS — R21 Rash and other nonspecific skin eruption: Secondary | ICD-10-CM | POA: Diagnosis not present

## 2014-11-13 DIAGNOSIS — R4781 Slurred speech: Secondary | ICD-10-CM | POA: Diagnosis not present

## 2014-11-13 DIAGNOSIS — N529 Male erectile dysfunction, unspecified: Secondary | ICD-10-CM | POA: Diagnosis not present

## 2014-11-13 DIAGNOSIS — F329 Major depressive disorder, single episode, unspecified: Secondary | ICD-10-CM | POA: Diagnosis not present

## 2014-11-13 DIAGNOSIS — I10 Essential (primary) hypertension: Secondary | ICD-10-CM | POA: Diagnosis not present

## 2014-11-14 DIAGNOSIS — E291 Testicular hypofunction: Secondary | ICD-10-CM | POA: Diagnosis not present

## 2014-11-15 ENCOUNTER — Encounter: Payer: Self-pay | Admitting: Cardiology

## 2014-11-15 ENCOUNTER — Ambulatory Visit (INDEPENDENT_AMBULATORY_CARE_PROVIDER_SITE_OTHER): Payer: Medicare Other | Admitting: Cardiology

## 2014-11-15 VITALS — BP 158/88 | HR 89 | Ht 71.0 in | Wt 228.0 lb

## 2014-11-15 DIAGNOSIS — M79602 Pain in left arm: Secondary | ICD-10-CM | POA: Diagnosis not present

## 2014-11-15 DIAGNOSIS — E669 Obesity, unspecified: Secondary | ICD-10-CM | POA: Diagnosis not present

## 2014-11-15 DIAGNOSIS — R079 Chest pain, unspecified: Secondary | ICD-10-CM | POA: Diagnosis not present

## 2014-11-15 DIAGNOSIS — R413 Other amnesia: Secondary | ICD-10-CM

## 2014-11-15 DIAGNOSIS — I2510 Atherosclerotic heart disease of native coronary artery without angina pectoris: Secondary | ICD-10-CM | POA: Diagnosis not present

## 2014-11-15 DIAGNOSIS — R0609 Other forms of dyspnea: Secondary | ICD-10-CM

## 2014-11-15 DIAGNOSIS — I10 Essential (primary) hypertension: Secondary | ICD-10-CM

## 2014-11-15 DIAGNOSIS — Z9861 Coronary angioplasty status: Secondary | ICD-10-CM

## 2014-11-15 DIAGNOSIS — E785 Hyperlipidemia, unspecified: Secondary | ICD-10-CM

## 2014-11-15 MED ORDER — CLOPIDOGREL BISULFATE 75 MG PO TABS: 75.0000 mg | ORAL_TABLET | Freq: Every day | ORAL | Status: DC

## 2014-11-15 MED ORDER — ASPIRIN EC 81 MG PO TBEC: 81.0000 mg | DELAYED_RELEASE_TABLET | Freq: Every day | ORAL | Status: DC

## 2014-11-15 NOTE — Patient Instructions (Addendum)
Your physician has recommended you make the following change in your medication:  Finish your current bottle of Brilinta  After finishing your current bottle of Brilinta start Plavix 75 mg once daily and Aspirin 81 mg once daily   Your physician recommends that you return for lab work in:  Please take lab order to the [facility] [place] 2 weeks after starting Plavix and Aspirin   Your physician wants you to follow-up in: 6 months. You will receive a reminder letter in the mail two months in advance. If you don't receive a letter, please call our office to schedule the follow-up appointment.

## 2014-11-16 ENCOUNTER — Encounter: Payer: Self-pay | Admitting: Cardiology

## 2014-11-16 DIAGNOSIS — R413 Other amnesia: Secondary | ICD-10-CM | POA: Insufficient documentation

## 2014-11-16 NOTE — Assessment & Plan Note (Signed)
No longer really having symptoms consistent with possible angina. He still has frequent ectopy on his EKGs and feels the palpitations on occasion. No true arrhythmias noted. At this point he is 1 year out, upon completion of current Brilinta prescription we will change him to Plavix 75 mg. He will then start aspirin 81 mg along with the Plavix 75 mg. 2 weeks after starting Plavix we'll check P2Y12 Inhibition Assay -- if the levels are adequate, we could continue Plavix and even potentially stop aspirin.

## 2014-11-16 NOTE — Progress Notes (Signed)
PCP: Leonides Sake, MD  Clinic Note: Chief Complaint  Patient presents with  . other    6 month f/u discuss Brilinta c/o left arm pain when under stress/ MI symptoms . Meds reviewed verbally with pt.    HPI: Johnny Kane is a 74 y.o. male with a history of CAD status post PCI to the RCA was found while evaluating potential arrhythmias. He was a former patient of Dr. Jolyn Nap, who is now following up with me. I perform PCI to his RCA in January 2015 Over the course of the summer he was concerned about intermittent episodes of chest pain. Reevaluate if the Myoview is negative for ischemia. The last few months he's come to believe that his symptoms are more consistent with episodes of getting angry and arguing with his wife.  Usually if he is able to go to take the dog for a walk, he will feel fine. He has become more active since coming to this conclusion. He now is quite active however walks routinely, and denies any real exertional chest tightness or pressure, dyspnea. No PND, orthopnea or edema to suggest heart failure. He only time he noticed chest discomfort is very stressful episodes related to his relationship with his wife. Despite these are in episodes, he adamantly insisted that he daily levels wife and they don't truly have "issues "just that she is not the same person he married. He has not had any syncopal or near syncopal episodes, rapid regular heartbeat more than he is routinely noted. No persistent palpitations that would consist of arrhythmia. No TIA or amaurosis fugax symptoms. Normal, hematochezia, hematuria.  He spent a good portion of the visit today talking about concerns about memory loss. He is notably having times of seeing these written something and not remember having written it. He sometimes can get places and nonnarcotic out there or not had it back to where he came from. He is also having occasional crying spells. He relates a lot of this to having similar  symptoms for the time that his grandchild died. He is however still very active in volunteer positions and tries to stay engaged in these activities He is a bit concerned in that he goes into the "doughnut hole" with his medications, and would like to see if he can change from Brilinta to another medicine.  It is not because he is having a bleeding complications recent bruising. Last year, he temporarily stopped her pravastatin to see if this would help his fatigue and aching symptoms. He did not notice any difference in 6 weeks, therefore he restarted. Is not currently listed on his regimen however.  Past Medical History  Diagnosis Date  . Hiatal hernia   . Diverticulosis of colon   . History of MRSA infection   . Hypertension   . Depression   . Hyperlipidemia   . OA (osteoarthritis)   . GERD (gastroesophageal reflux disease)   . Palpitations   . Vitamin B 12 deficiency   . Palpitations   . Sleep apnea     a. mild not on cpap   . Aortic valve sclerosis     a. 12/2013 Echo: Aortic Sclerosis; EF 60-65%, Gr1 DD, mild AI, mildly dil LA.  Marland Kitchen Aortic calcification     No evidence of AAA on CT Abd in 2013  . Low testosterone   . Diabetes mellitus without complication   . Vasovagal episode   . CAD S/P percutaneous coronary angioplasty  a. 07/2011 Nl MV;  b. 11/2013 Cath: LM nl, LAD 20p, D1/2 nl, LCX 30p, OM1 40, OM2 nl, RCA 90-66m (3.5x18 Xience DES). c. Myoview 04/2014: No ischmia/Infarction, normal EF.    Prior Cardiac Evaluation and Past Surgical History: Procedure Laterality Date  .  Myoview      No Ischemia or Infarction, Nl EF.    June 2015  . Transthoracic echocardiogram  February 2015    EF 60-65%, Gr1DD, mild AI with aortic sclerosis, mildly dilated LA.  Marland Kitchen Coronary angioplasty  11/25/2013    Xience Alpine DES 3.5 mm x 18 mm) - RCA    . Left heart catheterization with coronary angiogram N/A 11/25/2013    Procedure: LEFT HEART CATHETERIZATION WITH CORONARY ANGIOGRAM;  Surgeon:  Leonie Man, MD;  Location: [facility] CATH [facility];  Service: Cardiovascular;  Laterality: N/A;   Allergies  Allergen Reactions  . Ciprofloxacin Hives and Itching  . Esomeprazole Magnesium     unknown  . Penicillins     unknown  . Soma [Carisoprodol]     unknown  . Sulfa Drugs Cross Reactors     unknown    Current outpatient prescriptions:  .  acetaminophen (TYLENOL) 325 MG tablet, Take 2 tablets (650 mg total) by mouth every 4 (four) hours as needed for headache or mild pain., Disp: , Rfl:  .  citalopram (CELEXA) 40 MG tablet, Take 40 mg by mouth daily., Disp: , Rfl:  .  clindamycin (CLEOCIN) 300 MG capsule, Take 300 mg by mouth as needed (dental procedures). , Disp: , Rfl:  .  doxycycline (VIBRAMYCIN) 100 MG capsule, Take 1 capsule (100 mg total) by mouth 2 (two) times daily. (Patient taking differently: Take 100 mg by mouth as needed. ), Disp: 20 capsule, Rfl: 0 .  isosorbide mononitrate (IMDUR) 60 MG 24 hr tablet, Take 1 tablet (60 mg total) by mouth daily., Disp: 90 tablet, Rfl: 3 .  modafinil (PROVIGIL) 200 MG tablet, Take 200 mg by mouth daily., Disp: , Rfl:  .  olmesartan (BENICAR) 10 mg TABS tablet, Take 10 mg by mouth daily., Disp: , Rfl:  .  pravastatin (PRAVACHOL) 20 MG tablet, Take 20 mg by mouth daily., Disp: , Rfl:  .  aspirin EC 81 MG tablet, Take 1 tablet (81 mg total) by mouth daily., Disp: 90 tablet, Rfl: 3 .  clopidogrel (PLAVIX) 75 MG tablet, Take 1 tablet (75 mg total) by mouth daily., Disp: 30 tablet, Rfl: 6  Pravastatin - 20 mg daily  History  Substance Use Topics  . Smoking status: Former Smoker -- 11 years    Quit date: 11/04/1967  . Smokeless tobacco: Never Used  . Alcohol Use: No   ROS: A comprehensive Review of Systems - Negative except symptoms noted in HPI. Psychological ROS: positive for - anxiety, depression, mood swings and Easily agitated by arguments with his wife -- stress these 2 chest pain; intermittent crying spells Neurological ROS: no TIA or  stroke symptoms positive for - confusion, memory loss and Concern for early stages of dementia (daughter noted & he agrees)  Wt Readings from Last 3 Encounters:  11/15/14 228 lb (103.42 kg)  05/17/14 232 lb 8 oz (105.461 kg)  04/12/14 234 lb (106.142 kg)   PHYSICAL EXAM BP 158/88 mmHg  Pulse 89  Ht 5\' 11"  (1.803 m)  Wt 228 lb (103.42 kg)  BMI 31.81 kg/m2 General appearance: alert, cooperative, appears stated age, no distress and mildly obese; normal mood and affect.  Neck: no adenopathy,  no JVD ; possible right carotid bruit. Would need to assess again prior to considering Dopplers. Lungs: clear to auscultation bilaterally, normal percussion bilaterally and non-labored  Heart: regular rate and rhythm, S1, S2 normal, no murmur, click, rub or gallop; nondisplaced PMI  Abdomen: soft, non-tender; bowel sounds normal; no masses, no organomegaly;  Extremities: extremities normal, atraumatic, no cyanosis or edema  Pulses: 2+ and symmetric;  Neurologic: Mental status: Alert, oriented, thought content appropriate; moves all 4 extremities.  Cranial nerves: normal (II-XII grossly intact)   Adult ECG Report  Rate: 82;  Rhythm: normal sinus rhythm, premature atrial contractions (PAC), premature ventricular contractions (PVC) and and fusion beats; RBBB  Narrative Interpretation: Borderline EKG with no ischemic changes.  RBBB complexes are new.  Recent Labs : None available -- he thinks last checked ~1 month ago  ASSESSMENT / PLAN: Chest pain with low risk for cardiac etiology Not having exertional symptoms, and mostly related with stress. It was setting of a negative Myoview and known cardiac disease that was essentially single vessel disease treated with a large stent makes microvascular ischemia unlikely. He may very well have microvascular ischemia, however he decided to stop the Imdur because he wasn't noticing that that made symptoms different it basically was the control of his mood  swings. He really apologized for having [place] do it through the episode of checking a stress test.    CAD S/P percutaneous coronary angioplasty - RCA DES 11/25/13 No longer really having symptoms consistent with possible angina. He still has frequent ectopy on his EKGs and feels the palpitations on occasion. No true arrhythmias noted. At this point he is 1 year out, upon completion of current Brilinta prescription we will change him to Plavix 75 mg. He will then start aspirin 81 mg along with the Plavix 75 mg. 2 weeks after starting Plavix we'll check P2Y12 Inhibition Assay -- if the levels are adequate, we could continue Plavix and even potentially stop aspirin.   DOE (dyspnea on exertion) Notably improved with improved exercise level. I continue to recommend staying active and exercising.   Obesity (BMI 30-39.9) The patient understands the need to lose weight with diet and exercise. We have discussed specific strategies for this.    Essential hypertension Slightly elevated today. The pressure was a bit loweron my recheck 138/78 mmHg.  Continue ARB. Not currently on beta blocker because of fatigue and issues in the past. He did have some bradycardia in the past.   Hyperlipidemia with target LDL less than 70 He restarted his pravastatin. Not listed in medications. I added back. I do think though with his memory loss concerns that he may want to consider potentially holding statins for a while to see if that helps. This may need to use a different medication if his lipids are under control. Since following lipids I have not been privy to Lab results.   Memory loss or impairment Clearly becoming an issue for them. I encouraged him to discuss with her PCP. They are asking questions about possible microvascular disease in the brain as opposed to in the heart. Clearly he may very well have that to I question the possibility of statin involvement and potentially stopping it. Otherwise would  defer management to PCP.    We talked for quite a long time about the memory loss issue. Over half the time of the visit was spent in consultation about these concerns and his depression type symptoms. I spent 45-50 minutes in the patient's room.  Orders Placed This Encounter  Procedures  . Platelet inhibition p2y12    Standing Status: Future     Number of Occurrences:      Standing Expiration Date: 11/16/2015  . EKG 12-Lead    Order Specific Question:  Where should this test be performed    Answer:  [facility] [place]   Meds ordered this encounter  Medications  . olmesartan (BENICAR) 10 mg TABS tablet    Sig: Take 10 mg by mouth daily.  . modafinil (PROVIGIL) 200 MG tablet    Sig: Take 200 mg by mouth daily.  . clopidogrel (PLAVIX) 75 MG tablet    Sig: Take 1 tablet (75 mg total) by mouth daily.    Dispense:  30 tablet    Refill:  6  . aspirin EC 81 MG tablet    Sig: Take 1 tablet (81 mg total) by mouth daily.    Dispense:  90 tablet    Refill:  3  . pravastatin (PRAVACHOL) 20 MG tablet    Sig: Take 20 mg by mouth daily.    Followup: 6 months  Burech Mcfarland W. Ellyn Hack, M.D., M.S. Interventional Cardiologist [facility]

## 2014-11-16 NOTE — Assessment & Plan Note (Signed)
Clearly becoming an issue for them. I encouraged him to discuss with her PCP. They are asking questions about possible microvascular disease in the brain as opposed to in the heart. Clearly he may very well have that to I question the possibility of statin involvement and potentially stopping it. Otherwise would defer management to PCP.

## 2014-11-16 NOTE — Assessment & Plan Note (Signed)
Notably improved with improved exercise level. I continue to recommend staying active and exercising.

## 2014-11-16 NOTE — Assessment & Plan Note (Signed)
The patient understands the need to lose weight with diet and exercise. We have discussed specific strategies for this.  

## 2014-11-16 NOTE — Assessment & Plan Note (Signed)
He restarted his pravastatin. Not listed in medications. I added back. I do think though with his memory loss concerns that he may want to consider potentially holding statins for a while to see if that helps. This may need to use a different medication if his lipids are under control. Since following lipids I have not been privy to Lab results.

## 2014-11-16 NOTE — Assessment & Plan Note (Signed)
Not having exertional symptoms, and mostly related with stress. It was setting of a negative Myoview and known cardiac disease that was essentially single vessel disease treated with a large stent makes microvascular ischemia unlikely. He may very well have microvascular ischemia, however he decided to stop the Imdur because he wasn't noticing that that made symptoms different it basically was the control of his mood swings. He really apologized for having [place] do it through the episode of checking a stress test.

## 2014-11-16 NOTE — Assessment & Plan Note (Signed)
Slightly elevated today. The pressure was a bit loweron my recheck 138/78 mmHg.  Continue ARB. Not currently on beta blocker because of fatigue and issues in the past. He did have some bradycardia in the past.

## 2014-11-27 ENCOUNTER — Telehealth: Payer: Self-pay

## 2014-11-27 ENCOUNTER — Ambulatory Visit (INDEPENDENT_AMBULATORY_CARE_PROVIDER_SITE_OTHER): Payer: Medicare Other | Admitting: *Deleted

## 2014-11-27 VITALS — BP 144/80 | HR 76 | Wt 230.8 lb

## 2014-11-27 DIAGNOSIS — I493 Ventricular premature depolarization: Secondary | ICD-10-CM | POA: Diagnosis not present

## 2014-11-27 MED ORDER — METOPROLOL SUCCINATE ER 25 MG PO TB24: 25.0000 mg | ORAL_TABLET | Freq: Every day | ORAL | Status: DC

## 2014-11-27 NOTE — Telephone Encounter (Signed)
Was he on a BB to begin with?  [place]

## 2014-11-27 NOTE — Telephone Encounter (Signed)
I do not see that he is on any AV Nodal blocking agents.   May need to consider short term monitor to document symptomatic bradycardia.  Leonie Man, MD

## 2014-11-27 NOTE — Telephone Encounter (Signed)
He was not currently on a beta blocker

## 2014-11-27 NOTE — Patient Instructions (Addendum)
Your physician has recommended you make the following change in your medication:  Start Metoprolol 25 mg once daily   Continue to monitor heart rate and blood pressure  Call is symptoms persist   Your physician recommends that you schedule a follow-up appointment in:  With Dr. Ellyn Hack on 12/06/14 at 2:30 pm

## 2014-11-27 NOTE — Progress Notes (Signed)
Patient called stated his heart rate was in the 40's over the weekend multiple times He checked it with his pulse oximeter and bp cuff  He stated he feels he is unable to get an accurate manual reading  He felt very sleepy when this was happening  He was not sure if he dozed off or passed out  His heart rate is currently 47 according to his machine   Reviewed patient symptoms and meds with Dr. Fletcher Anon  Dr. Fletcher Anon recommends patient come for EKG   Patients EKG showed frequent PVC's  Heart rate of 74 Reviewed EKG with Dr. Fletcher Anon  Dr. Fletcher Anon verbally ordered Toprol 25 mg once daily  Follow up with Dr. Ellyn Hack at next available appt   Instructed patient to contact EMS if his symptoms became emergent

## 2014-11-27 NOTE — Telephone Encounter (Signed)
Then why are we adding a BB if he was bradycardic?  [place]

## 2014-11-27 NOTE — Telephone Encounter (Signed)
See nurse visit note.

## 2014-11-27 NOTE — Telephone Encounter (Signed)
Pt states his HR has dropped to 45, states it has been running low, since the bad weather had started. Please call.

## 2014-11-27 NOTE — Telephone Encounter (Signed)
He was not bradycardiac  Heart rate was in the 70's (see note from nurse visit)  Pulse oximeter was reading incorrect b/c of the number of PVC's

## 2014-11-27 NOTE — Telephone Encounter (Signed)
Patient stated his heart rate was in the 40's over the weekend multiple times He checked it with his pulse oximeter and bp cuff  He stated he feels he is unable to get an accurate manual reading  He felt very sleepy when this was happening  He was not sure if he dozed off or passed out  His heart rate is currently 47 according to his machine   Reviewed patient symptoms and meds with Dr. Fletcher Anon  Dr. Fletcher Anon recommends patient come for EKG  Patient verbalized understanding and stated he will have someone bring him now   Instructed patient to contact EMS if his situation becomes emergent

## 2014-11-27 NOTE — Telephone Encounter (Signed)
I thought that the HR was in 40s over the weekend?  [place]

## 2014-11-28 DIAGNOSIS — E291 Testicular hypofunction: Secondary | ICD-10-CM | POA: Diagnosis not present

## 2014-11-28 NOTE — Telephone Encounter (Signed)
Discussed patient visit with Dr. Ellyn Hack via telephone  Explained that patient was not bradycardic, his pulse oximeter was reading incorrectly because of PVC's  EKG showed hr in the 70's  Dr. Ellyn Hack verbally agreed with decision to start beta blocker

## 2014-12-06 ENCOUNTER — Encounter: Payer: Self-pay | Admitting: Cardiology

## 2014-12-06 ENCOUNTER — Ambulatory Visit (INDEPENDENT_AMBULATORY_CARE_PROVIDER_SITE_OTHER): Payer: Medicare Other | Admitting: Cardiology

## 2014-12-06 VITALS — BP 148/60 | HR 74 | Ht 71.0 in | Wt 231.5 lb

## 2014-12-06 DIAGNOSIS — E785 Hyperlipidemia, unspecified: Secondary | ICD-10-CM

## 2014-12-06 DIAGNOSIS — I493 Ventricular premature depolarization: Secondary | ICD-10-CM

## 2014-12-06 DIAGNOSIS — I2510 Atherosclerotic heart disease of native coronary artery without angina pectoris: Secondary | ICD-10-CM | POA: Diagnosis not present

## 2014-12-06 DIAGNOSIS — R0609 Other forms of dyspnea: Secondary | ICD-10-CM

## 2014-12-06 DIAGNOSIS — I10 Essential (primary) hypertension: Secondary | ICD-10-CM

## 2014-12-06 DIAGNOSIS — Z9861 Coronary angioplasty status: Secondary | ICD-10-CM

## 2014-12-06 DIAGNOSIS — E669 Obesity, unspecified: Secondary | ICD-10-CM

## 2014-12-06 NOTE — Patient Instructions (Addendum)
Your physician has recommended you make the following change in your medication:  Take an extra 12.5mg  of Metoprolol (1/2 tablet) for severe palpitations   Your physician wants you to follow-up in: 6 months with Dr. Ellyn Hack. You will receive a reminder letter in the mail two months in advance. If you don't receive a letter, please call our office to schedule the follow-up appointment.

## 2014-12-07 ENCOUNTER — Telehealth: Payer: Self-pay | Admitting: *Deleted

## 2014-12-07 DIAGNOSIS — I493 Ventricular premature depolarization: Secondary | ICD-10-CM | POA: Insufficient documentation

## 2014-12-07 DIAGNOSIS — J189 Pneumonia, unspecified organism: Secondary | ICD-10-CM | POA: Diagnosis not present

## 2014-12-07 NOTE — Assessment & Plan Note (Signed)
Improved on beta blocker. Need to monitor for fatigue. Maybe would consider acebutolol or other type of beta blocker. Can take an additional half dose if need be when necessary for worsening episodes.

## 2014-12-07 NOTE — Assessment & Plan Note (Signed)
My understanding was that he was on ARB. This is not listed on his medications. I think they stopped the ARB to use beta blocker. Blood pressure is borderline today but stable.

## 2014-12-07 NOTE — Assessment & Plan Note (Signed)
The patient understands the need to lose weight with diet and exercise. We have discussed specific strategies for this.  

## 2014-12-07 NOTE — Telephone Encounter (Signed)
Patient stated that he is worried his Toprol is making him fatigued He stated his blood  He also suspects that he might have the flu  I asked patient of he wanted me to ask Dr. Ellyn Hack to recommend a different treatment option  He stated that he just wanted me to let Dr. Ellyn Hack know  He is willing to wait a little while and see if he adjusts to the Toprol or if he has the flu  Instructed patient to call if he began to feel any worse

## 2014-12-07 NOTE — Telephone Encounter (Signed)
Pt calling stating that he wants to rethink the blood pressure med's. And now he states is causing a lot of fatigue.  Pt would like to be put on something else. Please advise.

## 2014-12-07 NOTE — Assessment & Plan Note (Signed)
Back on statin. May want to consider a different option. See last note.

## 2014-12-07 NOTE — Telephone Encounter (Signed)
Tell him to just wait it out. We may switch him over to something else, we have to determine which is worse fatigue or palpitations; for now I would like him to stay on a beta blocker.  I think he is just responded to my asking him about fatigue yesterday  [facility]

## 2014-12-07 NOTE — Assessment & Plan Note (Addendum)
As far as I can tell, no further angina. Negative Myoview. Converting from Brilinta to Plavix. Awaiting P2Y12 assay. Now on beta blocker and restarting statin. Is not currently on ARB

## 2014-12-07 NOTE — Progress Notes (Signed)
PCP: Leonides Sake, MD  Clinic Note: Chief Complaint  Patient presents with  . other    C/o PVC's. Meds reviewed verbally with pt.    HPI: Johnny Kane is a 74 y.o. male with a PMH below who presents today for a quick follow-up after being seen for PVCs as a walk-in visit last week. I just saw him in January and he was doing relatively well from a cardiac standpoint, and was perseverating on memory loss-- he had just had a stress test performed that was negative for ischemia.. At that time he wasn't noticing much in the way of any palpitations. Unfortunately, a few weeks after that visit he was concerned about potential bradycardia, when he came into the clinic, he indicated that his pulse oximeter had indicated a low heart rate. It would appear that on EKG he was in sinus rhythm with PVCs that were relatively frequent as was the case when I saw him in clinic last month. Based on the frequency of PVCs it seemed like he was probably not really getting a report of his heart rate. He was therefore started on metoprolol.  He is also in the process of converting from Brilinta to Plavix.  He comes in today just to see how he is doing in follow-up after starting the PVCs. He says that the palpitations have definitely increased with the beta blocker, but he is noticing maybe a little bit of fatigue.  He denies any significant exertional chest tightness or pressure or significant dyspnea with rest or exertion. No PND, orthopnea but does have mild edema. Despite having PVCs, he denies any syncope or near-syncope, TIA/amaurosis fugax symptoms. He has been working on trying to keep his emotional situations a day, and is therefore had less of his discomfort. He denies any melena, hematochezia, hematuria or epistaxis. He is on his last month of Brilinta.  Past Medical History  Diagnosis Date  . Hiatal hernia   . Diverticulosis of colon   . History of MRSA infection   . Hypertension   . Depression     . Hyperlipidemia   . OA (osteoarthritis)   . GERD (gastroesophageal reflux disease)   . Palpitations   . Vitamin B 12 deficiency   . Palpitations   . Sleep apnea     a. mild not on cpap   . Aortic valve sclerosis     a. 12/2013 Echo: Aortic Sclerosis; EF 60-65%, Gr1 DD, mild AI, mildly dil LA.  Marland Kitchen Aortic calcification     No evidence of AAA on CT Abd in 2013  . Low testosterone   . Diabetes mellitus without complication   . Vasovagal episode   . CAD S/P percutaneous coronary angioplasty     a. 07/2011 Nl MV;  b. 11/2013 Cath: LM nl, LAD 20p, D1/2 nl, LCX 30p, OM1 40, OM2 nl, RCA 90-51m (3.5x18 Xience DES). c. Myoview 04/2014: No ischmia/Infarction, normal EF.    Prior Cardiac Evaluation and Past Surgical History: Past Surgical History  Procedure Laterality Date  . Replacement total knee      one on each knee  . Transthoracic echocardiogram  February 2015    EF 60-65%, Gr1DD, mild AI with aortic sclerosis, mildly dilated LA.  . Tonsillectomy    . Cholecystectomy N/A 07/20/2013    Procedure: LAPAROSCOPIC CHOLECYSTECTOMY;  Surgeon: Joyice Faster. Cornett, MD;  Location: [facility] ORS;  Service: General;  Laterality: N/A;  . Coronary angioplasty  11/25/2013    DES   RCA    .  Left heart catheterization with coronary angiogram N/A 11/25/2013    Procedure: LEFT HEART CATHETERIZATION WITH CORONARY ANGIOGRAM;  Surgeon: Leonie Man, MD;  Location: [facility];  Service: Cardiovascular;  Laterality: N/A;    ROS: A comprehensive was performed. Review of Systems  Constitutional: Negative for weight loss and malaise/fatigue.  HENT: Positive for congestion. Negative for nosebleeds.   Eyes: Negative for blurred vision.  Respiratory: Negative for shortness of breath.   Cardiovascular: Positive for palpitations. Negative for chest pain and claudication.  Gastrointestinal: Negative for blood in stool and melena.  Genitourinary: Negative for hematuria.  Neurological: Negative for dizziness.   Endo/Heme/Allergies: Does not bruise/bleed easily.  Psychiatric/Behavioral: Positive for depression and memory loss. The patient is nervous/anxious.   All other systems reviewed and are negative.   Current Outpatient Prescriptions on File Prior to Visit  Medication Sig Dispense Refill  . acetaminophen (TYLENOL) 325 MG tablet Take 2 tablets (650 mg total) by mouth every 4 (four) hours as needed for headache or mild pain.    Marland Kitchen aspirin EC 81 MG tablet Take 1 tablet (81 mg total) by mouth daily. 90 tablet 3  . citalopram (CELEXA) 40 MG tablet Take 40 mg by mouth daily.    . clindamycin (CLEOCIN) 300 MG capsule Take 300 mg by mouth as needed (dental procedures).     . clopidogrel (PLAVIX) 75 MG tablet Take 1 tablet (75 mg total) by mouth daily. 30 tablet 6  . modafinil (PROVIGIL) 200 MG tablet Take 100 mg by mouth daily.     . pravastatin (PRAVACHOL) 20 MG tablet Take 20 mg by mouth daily.     No current facility-administered medications on file prior to visit.   Allergies  Allergen Reactions  . Ciprofloxacin Hives and Itching  . Esomeprazole Magnesium     unknown  . Penicillins     unknown  . Soma [Carisoprodol]     unknown  . Sulfa Drugs Cross Reactors     unknown   History  Substance Use Topics  . Smoking status: Former Smoker -- 11 years    Quit date: 11/04/1967  . Smokeless tobacco: Never Used  . Alcohol Use: No   family history includes Asthma in his mother; Heart attack in his father; Hypertension in his mother.  Wt Readings from Last 3 Encounters:  12/06/14 231 lb 8 oz (105.008 kg)  11/27/14 230 lb 12 oz (104.668 kg)  11/15/14 228 lb (103.42 kg)    PHYSICAL EXAM BP 148/60 mmHg  Pulse 74  Ht 5\' 11"  (1.803 m)  Wt 231 lb 8 oz (105.008 kg)  BMI 32.30 kg/m2 General appearance: alert, cooperative, appears stated age, no distress and mildly obese; normal mood and affect.  Neck: no adenopathy, no JVD ; possible right carotid bruit. Would need to assess again prior to  considering Dopplers. Lungs: clear to auscultation bilaterally, normal percussion bilaterally and non-labored  Heart: regular rate and rhythm, S1, S2 normal, no murmur, click, rub or gallop; nondisplaced PMI  Abdomen: soft, non-tender; bowel sounds normal; no masses, no organomegaly;  Extremities: extremities normal, atraumatic, no cyanosis or edema  Pulses: 2+ and symmetric;  Neurologic: Mental status: Alert, oriented, thought content appropriate; moves all 4 extremities.  Cranial nerves: normal (II-XII grossly intact)   Adult ECG Report  Rate: 74 ;  Rhythm: normal sinus rhythm, premature ventricular contractions (PVC) and Baseline RBBB  Narrative Interpretation: Stable EKG  Recent Labs:   Chemistry      Component Value Date/Time  NA 141 11/26/2013 0600   NA 142 11/15/2013 1203   K 4.3 11/26/2013 0600   CL 104 11/26/2013 0600   CO2 25 11/26/2013 0600   BUN 14 11/26/2013 0600   BUN 18 11/15/2013 1203   CREATININE 1.04 11/26/2013 0600   CREATININE 1.10 07/27/2013 1644      Component Value Date/Time   CALCIUM 9.0 11/26/2013 0600   ALKPHOS 66 07/27/2013 1644   AST 17 07/27/2013 1644   ALT 24 07/27/2013 1644   BILITOT 0.5 07/27/2013 1644     No results found for: CHOL, HDL, LDLCALC, LDLDIRECT, TRIG, CHOLHDL   ASSESSMENT / PLAN: Symptomatic PVCs Improved on beta blocker. Need to monitor for fatigue. Maybe would consider acebutolol or other type of beta blocker. Can take an additional half dose if need be when necessary for worsening episodes.   CAD S/P percutaneous coronary angioplasty - RCA DES 11/25/13 As far as I can tell, no further angina. Negative Myoview. Converting from Brilinta to Plavix. Awaiting P2Y12 assay. Now on beta blocker and restarting statin. Is not currently on ARB   Essential hypertension My understanding was that he was on ARB. This is not listed on his medications. I think they stopped the ARB to use beta blocker. Blood pressure is borderline  today but stable.   Hyperlipidemia with target LDL less than 70 Back on statin. May want to consider a different option. See last note.   Obesity (BMI 30-39.9) The patient understands the need to lose weight with diet and exercise. We have discussed specific strategies for this.      Orders Placed This Encounter  Procedures  . EKG 12-Lead   Meds ordered this encounter  Medications  . doxycycline (DORYX) 100 MG DR capsule    Sig: Take 100 mg by mouth 2 (two) times daily as needed.  . metoprolol succinate (TOPROL-XL) 25 MG 24 hr tablet    Sig: Take 25 mg by mouth daily.      Followup: 6 months   [name], Leonie Green, M.D., M.S. Interventional Cardiologist   Pager # [phone]

## 2014-12-08 NOTE — Telephone Encounter (Signed)
Informed patient of Dr. Darcus Pester response  Patient verbalized understanding

## 2014-12-12 DIAGNOSIS — E291 Testicular hypofunction: Secondary | ICD-10-CM | POA: Diagnosis not present

## 2014-12-15 ENCOUNTER — Other Ambulatory Visit (HOSPITAL_COMMUNITY)
Admission: RE | Admit: 2014-12-15 | Discharge: 2014-12-15 | Disposition: A | Discharge status: Home / Self Care | Payer: Medicare Other | Source: Ambulatory Visit | Attending: Cardiology | Admitting: Cardiology

## 2014-12-15 DIAGNOSIS — I493 Ventricular premature depolarization: Secondary | ICD-10-CM | POA: Diagnosis not present

## 2014-12-15 LAB — PLATELET INHIBITION P2Y12: PLATELET FUNCTION P2Y12: 7 [PRU] — AB (ref 194–418)

## 2014-12-18 ENCOUNTER — Telehealth: Payer: Self-pay | Admitting: *Deleted

## 2014-12-18 NOTE — Telephone Encounter (Signed)
-----   Message from Leonie Man, MD sent at 12/16/2014  4:34 PM EST ----- Wow Plavix works. (provided that this was drawn 2 wks after starting Plavix). Stop ASA -- should be fine with Just Plavix.  Leonie Man, MD

## 2014-12-26 DIAGNOSIS — E291 Testicular hypofunction: Secondary | ICD-10-CM | POA: Diagnosis not present

## 2014-12-28 DIAGNOSIS — R7309 Other abnormal glucose: Secondary | ICD-10-CM | POA: Diagnosis not present

## 2014-12-28 DIAGNOSIS — Z79899 Other long term (current) drug therapy: Secondary | ICD-10-CM | POA: Diagnosis not present

## 2014-12-28 DIAGNOSIS — I10 Essential (primary) hypertension: Secondary | ICD-10-CM | POA: Diagnosis not present

## 2014-12-28 DIAGNOSIS — E291 Testicular hypofunction: Secondary | ICD-10-CM | POA: Diagnosis not present

## 2014-12-28 DIAGNOSIS — Z125 Encounter for screening for malignant neoplasm of prostate: Secondary | ICD-10-CM | POA: Diagnosis not present

## 2014-12-28 DIAGNOSIS — E782 Mixed hyperlipidemia: Secondary | ICD-10-CM | POA: Diagnosis not present

## 2014-12-28 DIAGNOSIS — F329 Major depressive disorder, single episode, unspecified: Secondary | ICD-10-CM | POA: Diagnosis not present

## 2015-01-02 ENCOUNTER — Other Ambulatory Visit: Payer: Self-pay | Admitting: Internal Medicine

## 2015-01-09 DIAGNOSIS — E291 Testicular hypofunction: Secondary | ICD-10-CM | POA: Diagnosis not present

## 2015-01-23 DIAGNOSIS — E291 Testicular hypofunction: Secondary | ICD-10-CM | POA: Diagnosis not present

## 2015-02-02 ENCOUNTER — Telehealth: Payer: Self-pay | Admitting: Internal Medicine

## 2015-02-02 NOTE — Telephone Encounter (Signed)
S/w pt confirmed he is a pt of Dr. Ellyn Hack, due to a recall with Dr. Caryl Comes that is why patient received a letter from our office.   Stated not having problems bp is stable at 140/70. Heart is not missing anymore beats, hasn't occurred in a while. Stated if any problems or concerns give [place] a call. Will receive a recall letter when it is time to see Dr. Ellyn Hack.

## 2015-02-02 NOTE — Telephone Encounter (Signed)
Patient returned call about a need to schedule an appt.  With Dr. Caryl Comes .  Patient says he does not see Dr. Caryl Comes anymore  And he now is a patient of Dr. Ellyn Hack.  Patient wants to check with Nurse to confirm.

## 2015-02-06 DIAGNOSIS — E291 Testicular hypofunction: Secondary | ICD-10-CM | POA: Diagnosis not present

## 2015-02-20 DIAGNOSIS — E291 Testicular hypofunction: Secondary | ICD-10-CM | POA: Diagnosis not present

## 2015-02-26 DIAGNOSIS — S50862A Insect bite (nonvenomous) of left forearm, initial encounter: Secondary | ICD-10-CM | POA: Diagnosis not present

## 2015-02-26 DIAGNOSIS — J029 Acute pharyngitis, unspecified: Secondary | ICD-10-CM | POA: Diagnosis not present

## 2015-02-27 DIAGNOSIS — Z96653 Presence of artificial knee joint, bilateral: Secondary | ICD-10-CM | POA: Diagnosis not present

## 2015-03-01 DIAGNOSIS — J029 Acute pharyngitis, unspecified: Secondary | ICD-10-CM | POA: Diagnosis not present

## 2015-03-06 DIAGNOSIS — E291 Testicular hypofunction: Secondary | ICD-10-CM | POA: Diagnosis not present

## 2015-03-20 DIAGNOSIS — E291 Testicular hypofunction: Secondary | ICD-10-CM | POA: Diagnosis not present

## 2015-04-03 DIAGNOSIS — E291 Testicular hypofunction: Secondary | ICD-10-CM | POA: Diagnosis not present

## 2015-04-17 DIAGNOSIS — E291 Testicular hypofunction: Secondary | ICD-10-CM | POA: Diagnosis not present

## 2015-04-30 ENCOUNTER — Other Ambulatory Visit: Payer: Self-pay

## 2015-05-01 DIAGNOSIS — E291 Testicular hypofunction: Secondary | ICD-10-CM | POA: Diagnosis not present

## 2015-05-15 DIAGNOSIS — E291 Testicular hypofunction: Secondary | ICD-10-CM | POA: Diagnosis not present

## 2015-05-29 DIAGNOSIS — E291 Testicular hypofunction: Secondary | ICD-10-CM | POA: Diagnosis not present

## 2015-06-12 DIAGNOSIS — E291 Testicular hypofunction: Secondary | ICD-10-CM | POA: Diagnosis not present

## 2015-06-26 DIAGNOSIS — E291 Testicular hypofunction: Secondary | ICD-10-CM | POA: Diagnosis not present

## 2015-07-02 DIAGNOSIS — I10 Essential (primary) hypertension: Secondary | ICD-10-CM | POA: Diagnosis not present

## 2015-07-02 DIAGNOSIS — E782 Mixed hyperlipidemia: Secondary | ICD-10-CM | POA: Diagnosis not present

## 2015-07-02 DIAGNOSIS — Z139 Encounter for screening, unspecified: Secondary | ICD-10-CM | POA: Diagnosis not present

## 2015-07-02 DIAGNOSIS — Z9181 History of falling: Secondary | ICD-10-CM | POA: Diagnosis not present

## 2015-07-02 DIAGNOSIS — Z79899 Other long term (current) drug therapy: Secondary | ICD-10-CM | POA: Diagnosis not present

## 2015-07-02 DIAGNOSIS — E291 Testicular hypofunction: Secondary | ICD-10-CM | POA: Diagnosis not present

## 2015-07-02 DIAGNOSIS — R7309 Other abnormal glucose: Secondary | ICD-10-CM | POA: Diagnosis not present

## 2015-07-04 ENCOUNTER — Ambulatory Visit (INDEPENDENT_AMBULATORY_CARE_PROVIDER_SITE_OTHER): Payer: Medicare Other | Admitting: Cardiology

## 2015-07-04 ENCOUNTER — Encounter: Payer: Self-pay | Admitting: Cardiology

## 2015-07-04 VITALS — BP 120/68 | HR 72 | Ht 71.0 in | Wt 232.8 lb

## 2015-07-04 DIAGNOSIS — R413 Other amnesia: Secondary | ICD-10-CM | POA: Diagnosis not present

## 2015-07-04 DIAGNOSIS — Z6833 Body mass index (BMI) 33.0-33.9, adult: Secondary | ICD-10-CM | POA: Diagnosis not present

## 2015-07-04 DIAGNOSIS — Z9861 Coronary angioplasty status: Secondary | ICD-10-CM

## 2015-07-04 DIAGNOSIS — I10 Essential (primary) hypertension: Secondary | ICD-10-CM | POA: Diagnosis not present

## 2015-07-04 DIAGNOSIS — I2510 Atherosclerotic heart disease of native coronary artery without angina pectoris: Secondary | ICD-10-CM | POA: Diagnosis not present

## 2015-07-04 DIAGNOSIS — E669 Obesity, unspecified: Secondary | ICD-10-CM

## 2015-07-04 DIAGNOSIS — D485 Neoplasm of uncertain behavior of skin: Secondary | ICD-10-CM | POA: Diagnosis not present

## 2015-07-04 DIAGNOSIS — I493 Ventricular premature depolarization: Secondary | ICD-10-CM | POA: Diagnosis not present

## 2015-07-04 DIAGNOSIS — E785 Hyperlipidemia, unspecified: Secondary | ICD-10-CM

## 2015-07-04 NOTE — Assessment & Plan Note (Signed)
Labs look pretty close to goal. Continue pravastatin. Consider checking an advanced lipid panel for follow-up labs to confirm accuracy of calculated values.

## 2015-07-04 NOTE — Patient Instructions (Addendum)
Medication Instructions:  Your physician recommends that you continue on your current medications as directed. Please refer to the Current Medication list given to you today.   Labwork: none  Testing/Procedures: none  Follow-Up: Your physician wants you to follow-up in: six months with Dr. [name].  You will receive a reminder letter in the mail two months in advance. If you don't receive a letter, please call our office to schedule the follow-up appointment.   Any Other Special Instructions Will Be Listed Below (If Applicable).   

## 2015-07-04 NOTE — Assessment & Plan Note (Signed)
Notably improved on beta blocker. Fatigue is also improved since adjusting testosterone level and taking Provigil

## 2015-07-04 NOTE — Assessment & Plan Note (Signed)
Doing well on Plavix, beta blocker and statin. I think the chest tightness is feeling now is probably more related to chest congestion and not angina. He is not noticing anything with exertion. Continue to monitor. Would not order stress test unless he is having symptoms.

## 2015-07-04 NOTE — Assessment & Plan Note (Signed)
Well-controlled. He did well with converting from ARB to beta blocker.

## 2015-07-04 NOTE — Assessment & Plan Note (Signed)
At least his weight is stable. Try to avoid excessive eating and increase level of activity.

## 2015-07-04 NOTE — Progress Notes (Signed)
PCP: Leonides Sake, MD  Clinic Note: Chief Complaint  Patient presents with  . Follow-up    6 mo  . Coronary Artery Disease  . Palpitations    HPI: Johnny Kane is a 74 y.o. male with a PMH below who presents today for a f/u of CAD-PCI to RCA in Jan 2015.  He was last seen in Feb 2016 for PVC f/u.   Has been concerned with memory loss & Palpitations / bradycardia Myoview 04/2014- Negative Converted from Brilinta to Plavix  STUDIES REVIEWED:  N/A  Interval History: Johnny Kane presents today saying that he feels "pretty good over the last couple weeks."  However radiata that require while he was concerned because his "oxygen levels were low. He has a pulse ox meter. He was under the belief that his oxygen levels were low related to poor circulation. He has been noticing some chest congestion and coughing was post nasal drip over last several weeks and recently was started on Doxil slightly less primary physician for possible bronchitis. He denies any anginal type chest pain but just has some congestion type chest discomfort.   He definitely notes that the palpitations have significantly improved since switching over the beta blocker. He is also much less fatigued and more with it since starting Provigil.  He says he can walk at a "stroll pace "for quite a long time. He can go up and down the 14 steps to his basement without too much difficulty but if he tries to rush up the steps of the short of breath. Not having any chest pain with it though.  He denies any significant exertional chest tightness or pressure or significant dyspnea with rest or exertion. No PND, orthopnea but does have mild edema. Despite having PVCs, he denies any syncope or near-syncope, TIA/amaurosis fugax symptoms. He is trying to do a better job keeping his emotional responses inject when dealing with his current relationship with his wife, who he is now the caregiver for.   Past Medical History  Diagnosis Date  .  Hiatal hernia   . Diverticulosis of colon   . History of MRSA infection   . Hypertension   . Depression   . Hyperlipidemia   . OA (osteoarthritis)   . GERD (gastroesophageal reflux disease)   . Palpitations   . Vitamin B 12 deficiency   . Palpitations   . Sleep apnea     a. mild not on cpap   . Aortic valve sclerosis     a. 12/2013 Echo: Aortic Sclerosis; EF 60-65%, Gr1 DD, mild AI, mildly dil LA.  Marland Kitchen Aortic calcification     No evidence of AAA on CT Abd in 2013  . Low testosterone   . Diabetes mellitus without complication   . Vasovagal episode   . CAD S/P percutaneous coronary angioplasty     a. 07/2011 Nl MV;  b. 11/2013 Cath: LM nl, LAD 20p, D1/2 nl, LCX 30p, OM1 40, OM2 nl, RCA 90-63m (3.5x18 Xience DES). c. Myoview 04/2014: No ischmia/Infarction, normal EF.    Prior Cardiac Evaluation and Past Surgical History: Past Surgical History  Procedure Laterality Date  . Replacement total knee      one on each knee  . Transthoracic echocardiogram  February 2015    EF 60-65%, Gr1DD, mild AI with aortic sclerosis, mildly dilated LA.  . Tonsillectomy    . Cholecystectomy N/A 07/20/2013    Procedure: LAPAROSCOPIC CHOLECYSTECTOMY;  Surgeon: Joyice Faster. Cornett, MD;  Location: [facility];  Service: General;  Laterality: N/A;  . Coronary angioplasty  11/25/2013    DES   RCA    . Left heart catheterization with coronary angiogram N/A 11/25/2013    Procedure: LEFT HEART CATHETERIZATION WITH CORONARY ANGIOGRAM;  Surgeon: Leonie Man, MD;  Location: [facility];  Service: Cardiovascular;  Laterality: N/A;    ROS: A comprehensive was performed. Review of Systems  Constitutional: Negative for weight loss and malaise/fatigue.  HENT: Positive for congestion (Recently started on doxycycline and Mucinex by his PCP.). Negative for nosebleeds.   Eyes: Negative for blurred vision.  Respiratory: Positive for cough (occasionally productive of thick sputum.). Negative for shortness of breath.     Cardiovascular: Positive for palpitations. Negative for chest pain and claudication.  Gastrointestinal: Negative for blood in stool and melena.  Genitourinary: Negative for hematuria.  Neurological: Negative for dizziness (When he has coughing spells).  Endo/Heme/Allergies: Does not bruise/bleed easily.  Psychiatric/Behavioral: Positive for depression (more under control) and memory loss. The patient is nervous/anxious.   All other systems reviewed and are negative.   Current Outpatient Prescriptions on File Prior to Visit  Medication Sig Dispense Refill  . acetaminophen (TYLENOL) 325 MG tablet Take 2 tablets (650 mg total) by mouth every 4 (four) hours as needed for headache or mild pain.    . citalopram (CELEXA) 40 MG tablet Take 40 mg by mouth daily.    . clopidogrel (PLAVIX) 75 MG tablet Take 1 tablet (75 mg total) by mouth daily. 30 tablet 6  . furosemide (LASIX) 20 MG tablet TAKE 1 TABLET BY MOUTH EVERY OTHER DAY 90 tablet 3  . metoprolol succinate (TOPROL-XL) 25 MG 24 hr tablet Take 25 mg by mouth daily.     . modafinil (PROVIGIL) 200 MG tablet Take 100 mg by mouth daily.     . clindamycin (CLEOCIN) 300 MG capsule Take 300 mg by mouth as needed (dental procedures).      No current facility-administered medications on file prior to visit.   Allergies  Allergen Reactions  . Ciprofloxacin Hives and Itching  . Esomeprazole Magnesium     unknown  . Penicillins     unknown  . Soma [Carisoprodol]     unknown  . Sulfa Drugs Cross Reactors     unknown   Social History  Substance Use Topics  . Smoking status: Former Smoker -- 11 years    Quit date: 11/04/1967  . Smokeless tobacco: Never Used  . Alcohol Use: No   family history includes Asthma in his mother; Heart attack in his father; Hypertension in his mother.  Wt Readings from Last 3 Encounters:  07/04/15 232 lb 12.8 oz (105.597 kg)  12/06/14 231 lb 8 oz (105.008 kg)  11/27/14 230 lb 12 oz (104.668 kg)    PHYSICAL  EXAM BP 120/68 mmHg  Pulse 72  Ht 5\' 11"  (1.803 m)  Wt 232 lb 12.8 oz (105.597 kg)  BMI 32.48 kg/m2 General appearance: alert, cooperative, appears stated age, no distress and mildly obese; normal mood and affect.  Neck: no adenopathy, no JVD ; possible right carotid bruit. Would need to assess again prior to considering Dopplers. Lungs: clear to auscultation bilaterally, normal percussion bilaterally and non-labored  Heart: regular rate and rhythm with intermittent ectopy, S1, S2 normal, no murmur, click, rub or gallop; nondisplaced PMI  Abdomen: soft, non-tender; bowel sounds normal; no masses, no organomegaly;  Extremities: extremities normal, atraumatic, no cyanosis or edema  Pulses: 2+ and symmetric;  Neurologic: Mental status:  Alert, oriented, thought content appropriate; moves all 4 extremities.  Cranial nerves: normal (II-XII grossly intact)   Adult ECG Report  Rate: 74 ;  Rhythm: normal sinus rhythm, premature ventricular contractions (PVC) and Baseline RBBB  Narrative Interpretation: Stable EKG  Recent Labs:  From February 2016 (he has had labs checked recently but I don't have them available.)  Na+ 139, K+ 4.9, Cl- 99, HCO3- 24 , BUN 14, Cr 1.2, Glu 94, Ca2+ 9.3; AST 21, ALT 16; AlkP 45; Alb 4.2, TP 6.4, T Bili 0.7  CBC: W 6.09, H/H 17.1/51.3, Plt 143  TC 130, TG 76, HDL 40, LDL 75  Testosterone 1224   ASSESSMENT / PLAN: Problem List Items Addressed This Visit    CAD S/P percutaneous coronary angioplasty - RCA DES 11/25/13 - Primary (Chronic)    Doing well on Plavix, beta blocker and statin. I think the chest tightness is feeling now is probably more related to chest congestion and not angina. He is not noticing anything with exertion. Continue to monitor. Would not order stress test unless he is having symptoms.      Relevant Medications   pravastatin (PRAVACHOL) 40 MG tablet   Other Relevant Orders   EKG 12-Lead   Essential hypertension (Chronic)     Well-controlled. He did well with converting from ARB to beta blocker.      Relevant Medications   pravastatin (PRAVACHOL) 40 MG tablet   Other Relevant Orders   EKG 12-Lead   Hyperlipidemia with target LDL less than 70 (Chronic)    Labs look pretty close to goal. Continue pravastatin. Consider checking an advanced lipid panel for follow-up labs to confirm accuracy of calculated values.      Relevant Medications   pravastatin (PRAVACHOL) 40 MG tablet   Other Relevant Orders   EKG 12-Lead   Memory loss or impairment (Chronic)   Relevant Orders   EKG 12-Lead   Obesity (BMI 30-39.9) (Chronic)    At least his weight is stable. Try to avoid excessive eating and increase level of activity.      Relevant Orders   EKG 12-Lead   Symptomatic PVCs (Chronic)    Notably improved on beta blocker. Fatigue is also improved since adjusting testosterone level and taking Provigil      Relevant Medications   pravastatin (PRAVACHOL) 40 MG tablet   Other Relevant Orders   EKG 12-Lead       Orders Placed This Encounter  Procedures  . EKG 12-Lead   Meds ordered this encounter  Medications  . doxycycline (VIBRAMYCIN) 100 MG capsule    Sig: Take 100 mg by mouth 2 (two) times daily.  . pravastatin (PRAVACHOL) 40 MG tablet    Sig: Take 40 mg by mouth at bedtime.    Refill:  5  . Fexofenadine HCl (MUCINEX ALLERGY PO)    Sig: Take 1 tablet by mouth 2 (two) times daily.    Medication Instructions:  Your physician recommends that you continue on your current medications as directed. Please refer to the Current Medication list given to you today.   Labwork: none  Testing/Procedures: none  Follow-Up: Your physician wants you to follow-up in: six months with Dr. Ellyn Hack.    Leonie Man, M.D., M.S. Interventional Cardiologist   Pager # [phone]

## 2015-07-07 ENCOUNTER — Other Ambulatory Visit: Payer: Self-pay | Admitting: Cardiology

## 2015-07-10 ENCOUNTER — Other Ambulatory Visit: Payer: Self-pay | Admitting: *Deleted

## 2015-07-10 DIAGNOSIS — E291 Testicular hypofunction: Secondary | ICD-10-CM | POA: Diagnosis not present

## 2015-07-10 MED ORDER — METOPROLOL SUCCINATE ER 25 MG PO TB24: 25.0000 mg | ORAL_TABLET | Freq: Every day | ORAL | Status: DC

## 2015-07-24 DIAGNOSIS — E291 Testicular hypofunction: Secondary | ICD-10-CM | POA: Diagnosis not present

## 2015-08-07 DIAGNOSIS — E291 Testicular hypofunction: Secondary | ICD-10-CM | POA: Diagnosis not present

## 2015-08-21 DIAGNOSIS — E291 Testicular hypofunction: Secondary | ICD-10-CM | POA: Diagnosis not present

## 2015-08-28 DIAGNOSIS — Z1389 Encounter for screening for other disorder: Secondary | ICD-10-CM | POA: Diagnosis not present

## 2015-08-28 DIAGNOSIS — Z6832 Body mass index (BMI) 32.0-32.9, adult: Secondary | ICD-10-CM | POA: Diagnosis not present

## 2015-08-28 DIAGNOSIS — R5383 Other fatigue: Secondary | ICD-10-CM | POA: Diagnosis not present

## 2015-08-28 DIAGNOSIS — I2510 Atherosclerotic heart disease of native coronary artery without angina pectoris: Secondary | ICD-10-CM | POA: Diagnosis not present

## 2015-09-04 DIAGNOSIS — E291 Testicular hypofunction: Secondary | ICD-10-CM | POA: Diagnosis not present

## 2015-09-18 DIAGNOSIS — E291 Testicular hypofunction: Secondary | ICD-10-CM | POA: Diagnosis not present

## 2015-09-19 DIAGNOSIS — L821 Other seborrheic keratosis: Secondary | ICD-10-CM | POA: Diagnosis not present

## 2015-09-19 DIAGNOSIS — D1801 Hemangioma of skin and subcutaneous tissue: Secondary | ICD-10-CM | POA: Diagnosis not present

## 2015-09-19 DIAGNOSIS — L570 Actinic keratosis: Secondary | ICD-10-CM | POA: Diagnosis not present

## 2015-09-19 DIAGNOSIS — D3617 Benign neoplasm of peripheral nerves and autonomic nervous system of trunk, unspecified: Secondary | ICD-10-CM | POA: Diagnosis not present

## 2015-10-02 DIAGNOSIS — E291 Testicular hypofunction: Secondary | ICD-10-CM | POA: Diagnosis not present

## 2015-10-11 ENCOUNTER — Telehealth: Payer: Self-pay | Admitting: *Deleted

## 2015-10-11 NOTE — Telephone Encounter (Signed)
Forward to Dr. Ellyn Hack to make aware.

## 2015-10-11 NOTE — Telephone Encounter (Signed)
Pt wanted to let us know that he is off the betablocker. His pcp is helping him But when he was on it he would wake up at night feeling he was going to die.  He is monitoring his BP and if he has any problems is going to call his PCP for he is helping with that But if we have any concerns please call him.

## 2015-10-16 DIAGNOSIS — E291 Testicular hypofunction: Secondary | ICD-10-CM | POA: Diagnosis not present

## 2015-10-22 NOTE — Telephone Encounter (Signed)
That is fine. Not sure how BB is having this effect, but will defer to PCP  [facility], Leonie Green, MD

## 2015-10-25 ENCOUNTER — Other Ambulatory Visit: Payer: Self-pay | Admitting: Cardiology

## 2015-10-25 MED ORDER — CLOPIDOGREL BISULFATE 75 MG PO TABS: 75.0000 mg | ORAL_TABLET | Freq: Every day | ORAL | Status: DC

## 2015-10-30 DIAGNOSIS — E291 Testicular hypofunction: Secondary | ICD-10-CM | POA: Diagnosis not present

## 2015-10-31 ENCOUNTER — Telehealth: Payer: Self-pay | Admitting: *Deleted

## 2015-10-31 DIAGNOSIS — R55 Syncope and collapse: Secondary | ICD-10-CM

## 2015-10-31 DIAGNOSIS — R002 Palpitations: Secondary | ICD-10-CM

## 2015-10-31 NOTE — Telephone Encounter (Signed)
Pt c/o Syncope: STAT if syncope occurred within 30 minutes and pt complains of lightheadedness High Priority if episode of passing out, completely, today or in last 24 hours   1. Did you pass out today? NO  2. When is the last time you passed out? Monday 10/29/15  3. Has this occurred multiple times? Since 09/27/15  4. Did you have any symptoms prior to passing out? No  5.

## 2015-11-01 NOTE — Telephone Encounter (Signed)
Spoke w/ pt.  He reports that he is having increased episodes of "falling asleep while standing". Pt reports that his BP is consistently 145/70-75 and HR remains 60-70. Pt has had sleep study in the past, it was determined that he has some apnea, but "not bad enough to treat". He states that he feels overwhelming sleepiness and is afraid to stand up due to fear of falling.  Reports that the sx will pass w/ time, and he is concerned that he is actually passing out or having near syncope.  He states that he was told previously that he is having a "vasovagal episode" and he is concerned that he may have a blockage.   He would like to know if a carotid u/s is something that should be ordered, as "this is a primary cause of fainting". Advised him that I will make Dr. Ellyn Hack aware and call back w/ his recommendations.

## 2015-11-01 NOTE — Telephone Encounter (Signed)
Spoke w/ pt.  Advised him of Dr. Allison Quarry recommendation. He does report palpitations on a regular basis.  He would like to proceed w/ scheduling carotid u/s, as well as 30 day monitor.  Order placed for 30 day monitor, pt transferred to scheduling to set up u/s.

## 2015-11-01 NOTE — Telephone Encounter (Signed)
Syncope is not usually related to carotid disease, but we can check dopplers.  ?? Is he noting any palpitation? --> if so, would order event monitor.  Leonie Man, MD

## 2015-11-04 ENCOUNTER — Encounter (INDEPENDENT_AMBULATORY_CARE_PROVIDER_SITE_OTHER): Payer: Medicare Other

## 2015-11-04 DIAGNOSIS — R55 Syncope and collapse: Secondary | ICD-10-CM

## 2015-11-04 DIAGNOSIS — R002 Palpitations: Secondary | ICD-10-CM | POA: Diagnosis not present

## 2015-11-08 ENCOUNTER — Ambulatory Visit: Payer: Medicare Other

## 2015-11-08 ENCOUNTER — Other Ambulatory Visit: Payer: Self-pay | Admitting: Cardiology

## 2015-11-08 DIAGNOSIS — R0989 Other specified symptoms and signs involving the circulatory and respiratory systems: Secondary | ICD-10-CM

## 2015-11-08 DIAGNOSIS — R55 Syncope and collapse: Secondary | ICD-10-CM

## 2015-11-13 DIAGNOSIS — E291 Testicular hypofunction: Secondary | ICD-10-CM | POA: Diagnosis not present

## 2015-11-20 DIAGNOSIS — T148 Other injury of unspecified body region: Secondary | ICD-10-CM | POA: Diagnosis not present

## 2015-11-20 DIAGNOSIS — Z8614 Personal history of Methicillin resistant Staphylococcus aureus infection: Secondary | ICD-10-CM | POA: Diagnosis not present

## 2015-11-20 DIAGNOSIS — K219 Gastro-esophageal reflux disease without esophagitis: Secondary | ICD-10-CM | POA: Diagnosis not present

## 2015-11-27 DIAGNOSIS — E291 Testicular hypofunction: Secondary | ICD-10-CM | POA: Diagnosis not present

## 2015-12-11 DIAGNOSIS — E291 Testicular hypofunction: Secondary | ICD-10-CM | POA: Diagnosis not present

## 2015-12-12 ENCOUNTER — Telehealth: Payer: Self-pay | Admitting: *Deleted

## 2015-12-12 NOTE — Telephone Encounter (Signed)
Patient called wanting his heart monitor results.

## 2015-12-12 NOTE — Progress Notes (Signed)
Quick Note:  Pretty normal monitor results. Occasional Premature Ventricular Contractions (PVCs) -- not all noted as symptomatic.  No arrhythmias.  [name], [name], MD  Pls forward to: Leonides Sake, MD ______

## 2015-12-12 NOTE — Telephone Encounter (Signed)
Event monitor results scanned into pt chart today. MD aware.

## 2015-12-13 NOTE — Telephone Encounter (Signed)
Result Notes    Notes Recorded by Leonie Man, MD on 12/12/2015 at 11:06 PM Pretty normal monitor results. Occasional Premature Ventricular Contractions (PVCs) -- not all noted as symptomatic.  No arrhythmias.  [name], [name], MD  Pls forward to: Leonides Sake, MD     Left message w/male who answered phone. States pt is in the shower and will call back

## 2015-12-13 NOTE — Telephone Encounter (Signed)
S/w pt regarding event monitor results. Pt verbalized understanding. Confirmed Feb 22 appt. Forwarded results to [employer]

## 2015-12-14 ENCOUNTER — Telehealth: Payer: Self-pay | Admitting: *Deleted

## 2015-12-14 NOTE — Telephone Encounter (Signed)
Spoke to patient. Result given . Verbalized understanding PATIENT WOULD LIKE TO HAVE COPY OF MONITOR REPORT WILL RECEIVE AT OFFICE VISIT

## 2015-12-14 NOTE — Telephone Encounter (Signed)
-----   Message from Leonie Man, MD sent at 12/12/2015 11:06 PM EST ----- Pretty normal monitor results.  Occasional Premature Ventricular Contractions (PVCs) -- not all noted as symptomatic.  No arrhythmias.  [name], [name], MD  Pls forward to: Leonides Sake, MD

## 2015-12-25 DIAGNOSIS — E291 Testicular hypofunction: Secondary | ICD-10-CM | POA: Diagnosis not present

## 2015-12-26 ENCOUNTER — Encounter: Payer: Self-pay | Admitting: Cardiology

## 2015-12-26 ENCOUNTER — Ambulatory Visit (INDEPENDENT_AMBULATORY_CARE_PROVIDER_SITE_OTHER): Payer: Medicare Other | Admitting: Cardiology

## 2015-12-26 VITALS — BP 124/76 | HR 70 | Ht 71.0 in | Wt 230.8 lb

## 2015-12-26 DIAGNOSIS — R55 Syncope and collapse: Secondary | ICD-10-CM | POA: Insufficient documentation

## 2015-12-26 DIAGNOSIS — E785 Hyperlipidemia, unspecified: Secondary | ICD-10-CM

## 2015-12-26 DIAGNOSIS — R413 Other amnesia: Secondary | ICD-10-CM

## 2015-12-26 DIAGNOSIS — I2510 Atherosclerotic heart disease of native coronary artery without angina pectoris: Secondary | ICD-10-CM

## 2015-12-26 DIAGNOSIS — I493 Ventricular premature depolarization: Secondary | ICD-10-CM

## 2015-12-26 DIAGNOSIS — R0609 Other forms of dyspnea: Secondary | ICD-10-CM

## 2015-12-26 DIAGNOSIS — I10 Essential (primary) hypertension: Secondary | ICD-10-CM | POA: Diagnosis not present

## 2015-12-26 DIAGNOSIS — E669 Obesity, unspecified: Secondary | ICD-10-CM

## 2015-12-26 DIAGNOSIS — I714 Abdominal aortic aneurysm, without rupture, unspecified: Secondary | ICD-10-CM

## 2015-12-26 DIAGNOSIS — Z9861 Coronary angioplasty status: Secondary | ICD-10-CM

## 2015-12-26 MED ORDER — FUROSEMIDE 20 MG PO TABS: 20.0000 mg | ORAL_TABLET | Freq: Every day | ORAL | Status: DC

## 2015-12-26 NOTE — Assessment & Plan Note (Signed)
Well-controlled now on ARB. Converted back from beta blocker to ARB

## 2015-12-26 NOTE — Patient Instructions (Signed)
Medication Instructions:  Your physician has recommended you make the following change in your medication:  TAKE lasix 20mg  daily   Labwork: none  Testing/Procedures: none  Follow-Up: Your physician wants you to follow-up in: six months with Dr. Ellyn Hack.  You will receive a reminder letter in the mail two months in advance. If you don't receive a letter, please call our office to schedule the follow-up appointment.   Any Other Special Instructions Will Be Listed Below (If Applicable).     If you need a refill on your cardiac medications before your next appointment, please call your pharmacy.

## 2015-12-26 NOTE — Assessment & Plan Note (Signed)
Not sure if this is related to chart for or not. I don't see that has been evaluated in the long time. We will check abdominal aortic ultrasound. He will need to be contacted for scheduling.

## 2015-12-26 NOTE — Assessment & Plan Note (Signed)
Stable. Improved. Continue to stay active.

## 2015-12-26 NOTE — Assessment & Plan Note (Signed)
Relatively well-controlled on current labs. He should have labs to review being scanned. Will review and report. Continue Pravachol.

## 2015-12-26 NOTE — Assessment & Plan Note (Signed)
Symptoms seem to have resolved. Nothing to see on event monitor or carotid Dopplers. Hard to tell what was happening. Thankfully there no longer present

## 2015-12-26 NOTE — Progress Notes (Signed)
PCP: Leonides Sake, MD  Clinic Note: Chief Complaint  Patient presents with  . Follow-up    Carotid Dopplers and event monitor to evaluate possible syncopal episodes  . Palpitations  . Coronary Artery Disease    HPI: Johnny Kane is a 75 y.o. male with a PMH below who presents today for 6 month f/u of CAD-PCI to RCA in Jan 2015 & palpitations / PVCs. Has been concerned with memory loss & Palpitations / bradycardia  Johnny Kane was last seen on 07/04/2015: He noted feeling pretty good over the last couple weeks. Concerned about oxygen levels being low. Noted the palpitations and significantly reduced after switching beta blockers. Much less fatigued. Also had started Provigil. Was able to walk at a slow pace for quite a long time. Could also go up 14 steps without difficulty.  Recent Hospitalizations: None  Studies Reviewed: Both ordered for syncope/fall asleep while standing  Carotid Dopplers 11/08/2015: 1-39% bilateral ICA stenosis, patent vertebral arteries with antegrade flow. Normal subclavian arteries.  Event Monitor January-February 2017:   Mostly Normal Sinus Rhythm; Avg HR ~82-84 bpm (50-100)  Occasional PVCs, with some Ventricular Trigeminy  No arrhythmia noted. No Afib, PAT, SVT or VT  7 episodes noted - all stable. Only 3 with symptoms - dizziness, fatigue, lightheaded - Palpitations None of the Trigeminy was documented as symptomatic. Similar symptoms noted with NSR only.  Relatively normal study with occasional PVCs - not all symptomatic.  Apparently, beta blocker was stopped by PCP.  Was having episodes of waking up at night feeling as though he would die.  Interval History: Johnny Kane presents today follow-up. He states that ever since he heard that the carotid Dopplers in the event monitor were essentially normal, he immediately started feeling better. He thinks that he was probably "in his head "and that most of this was related to stress and his  emotional condition. There are discussions with his daughter by 190s have his "lungs checked ". He doesn't think he needs to. He says he's feeling better in general. He is now been studying philosophy and psychiatry in order to understand his symptoms. He says he is sleeping better. The palpitations are better even though he is no longer on the metoprolol. He was taken off the metoprolol for strange nightmare type symptoms that have now gone away. His blood pressure has been relatively well-controlled on Benicar.  He still indicates that if he pushes himself for a while he will get a little bit short of breath but he is able to walk at a slow rate for a long time without problems. He can go up and down steps without problems. His energy level is definitely improved with Provigil.  Overall from a cardiac standpoint he is stable with a cardiac review of symptoms as follows: No chest pain or shortness of breath with rest or exertion.  No PND, orthopnea with mild controllable edema using essentially daily Lasix 20 mg No significant palpitations, lightheadedness, dizziness, weakness or syncope/near syncope - since hearing that the evaluations were negative No TIA/amaurosis fugax symptoms. No melena, hematochezia, hematuria, or epstaxis. No claudication.  ROS: A comprehensive was performed. Review of Systems  Constitutional: Negative for malaise/fatigue.  Respiratory: Positive for shortness of breath (With significant exertion).   Gastrointestinal: Negative for blood in stool and melena.  Genitourinary: Negative for hematuria.  Neurological: Positive for loss of consciousness (unclear if it is true LOC, but none in the last month). Negative for dizziness.  No further near syncopal episodes. He apparently had 2 episodes where he felt as though he "fell asleep standing"  Psychiatric/Behavioral: The patient is nervous/anxious (overall improved).   All other systems reviewed and are  negative.    Past Medical History  Diagnosis Date  . Hiatal hernia   . Diverticulosis of colon   . History of MRSA infection   . Hypertension   . Depression   . Hyperlipidemia   . OA (osteoarthritis)   . GERD (gastroesophageal reflux disease)   . Palpitations   . Vitamin B 12 deficiency   . Palpitations   . Sleep apnea     a. mild not on cpap   . Aortic valve sclerosis     a. 12/2013 Echo: Aortic Sclerosis; EF 60-65%, Gr1 DD, mild AI, mildly dil LA.  Marland Kitchen Aortic calcification (H[facility])     No evidence of AAA on CT Abd in 2013  . Low testosterone   . Diabetes mellitus without complication ([place])   . Vasovagal episode   . CAD S/P percutaneous coronary angioplasty     a. 07/2011 Nl MV;  b. 11/2013 Cath: LM nl, LAD 20p, D1/2 nl, LCX 30p, OM1 40, OM2 nl, RCA 90-73m (3.5x18 Xience DES). c. Myoview 04/2014: No ischmia/Infarction, normal EF.    Past Surgical History  Procedure Laterality Date  . Replacement total knee      one on each knee  . Transthoracic echocardiogram  February 2015    EF 60-65%, Gr1DD, mild AI with aortic sclerosis, mildly dilated LA.  . Tonsillectomy    . Cholecystectomy N/A 07/20/2013    Procedure: LAPAROSCOPIC CHOLECYSTECTOMY;  Surgeon: Joyice Faster. Cornett, MD;  Location: [facility];  Service: General;  Laterality: N/A;  . Coronary angioplasty  11/25/2013    DES   RCA    . Left heart catheterization with coronary angiogram N/A 11/25/2013    Procedure: LEFT HEART CATHETERIZATION WITH CORONARY ANGIOGRAM;  Surgeon: Leonie Man, MD;  Location: [facility] C[facility] LAB;  Service: Cardiovascular;  Laterality: N/A;   Prior to Admission medications   Medication Sig Start Date End Date Taking? Authorizing Provider  acetaminophen (TYLENOL) 325 MG tablet Take 2 tablets (650 mg total) by mouth every 4 (four) hours as needed for headache or mild pain. 11/26/13  Yes Luke K Kilroy, PA-C  citalopram (CELEXA) 40 MG tablet Take 40 mg by mouth daily.   Yes Historical Provider, MD  clindamycin  (CLEOCIN) 300 MG capsule Take 300 mg by mouth as needed (dental procedures).  07/28/13  Yes Historical Provider, MD  clopidogrel (PLAVIX) 75 MG tablet Take 1 tablet (75 mg total) by mouth daily. 10/25/15  Yes Leonie Man, MD  doxycycline (VIBRAMYCIN) 100 MG capsule Take 100 mg by mouth 2 (two) times daily. 07/02/15  Yes Historical Provider, MD  famotidine (PEPCID) 40 MG tablet Take 40 mg by mouth daily.   Yes Historical Provider, MD  Fexofenadine HCl (MUCINEX ALLERGY PO) Take 1 tablet by mouth 2 (two) times daily.   Yes Historical Provider, MD  modafinil (PROVIGIL) 200 MG tablet Take 100 mg by mouth daily.    Yes Historical Provider, MD  olmesartan (BENICAR) 20 MG tablet Take 40 mg by mouth daily. 10/15/15  Yes Historical Provider, MD  pravastatin (PRAVACHOL) 40 MG tablet Take 40 mg by mouth at bedtime. 06/20/15  Yes Historical Provider, MD  testosterone cypionate (DEPOTESTOTERONE CYPIONATE) 100 MG/ML injection Inject 100 mg into the muscle every 14 (fourteen) days. For IM use only  Yes Historical Provider, MD  furosemide (LASIX) 20 MG tablet Take 1 tablet (20 mg total) by mouth daily. 12/26/15   Leonie Man, MD    Allergies  Allergen Reactions  . Ciprofloxacin Hives and Itching  . Esomeprazole Magnesium     unknown  . Penicillins     unknown  . Soma [Carisoprodol]     unknown  . Sulfa Drugs Cross Reactors     unknown    Social History   Social History  . Marital Status: Married    Spouse Name: N/A  . Number of Children: 2  . Years of Education: N/A   Occupational History  . [employer]     retired   Social History Main Topics  . Smoking status: Former Smoker -- 11 years    Quit date: 11/04/1967  . Smokeless tobacco: Never Used  . Alcohol Use: No  . Drug Use: No  . Sexual Activity: Not Asked   Other Topics Concern  . None   Social History Narrative   Married father of 2. Retired from [employer].   Former smoker - quit in 1969 after 11 years; denies alcohol consumption    Family History  Problem Relation Age of Onset  . Asthma Mother   . Hypertension Mother   . Heart attack Father      Wt Readings from Last 3 Encounters:  12/26/15 230 lb 12.8 oz (104.69 kg)  07/04/15 232 lb 12.8 oz (105.597 kg)  12/06/14 231 lb 8 oz (105.008 kg)    PHYSICAL EXAM BP 124/76 mmHg  Pulse 70  Ht 5\' 11"  (1.803 m)  Wt 230 lb 12.8 oz (104.69 kg)  BMI 32.20 kg/m2 General appearance: alert, cooperative, appears stated age, no distress and mildly obese; normal mood and affect.  Neck: no adenopathy, no JVD ; possible right carotid bruit. Would need to assess again prior to considering Dopplers. Lungs: clear to auscultation bilaterally, normal percussion bilaterally and non-labored  Heart: regular rate and rhythm with intermittent ectopy, S1, S2 normal, no murmur, click, rub or gallop; nondisplaced PMI  Abdomen: soft, non-tender; bowel sounds normal; no masses, no organomegaly;  Extremities: extremities normal, atraumatic, no cyanosis or edema  Pulses: 2+ and symmetric;  Neurologic: Mental status: Alert, oriented, thought content appropriate; moves all 4 extremities.  Cranial nerves: normal (II-XII grossly intact)    Adult ECG Report Not checked  Other studies Reviewed: Additional studies/ records that were reviewed today include:  Recent Labs:  Due to be checked by PCP. No results found for: CHOL, HDL, LDLCALC, LDLDIRECT, TRIG, CHOLHDL   ASSESSMENT / PLAN: Problem List Items Addressed This Visit    Symptomatic PVCs (Chronic)    No longer seem to be a problem. Nothing significant noted on event monitor. No longer on beta blocker.      Relevant Medications   olmesartan (BENICAR) 20 MG tablet   furosemide (LASIX) 20 MG tablet   Obesity (BMI 30-39.9) (Chronic)   Near syncope    Symptoms seem to have resolved. Nothing to see on event monitor or carotid Dopplers. Hard to tell what was happening. Thankfully there no longer present      Relevant  Medications   olmesartan (BENICAR) 20 MG tablet   furosemide (LASIX) 20 MG tablet   Memory loss or impairment (Chronic)   Hyperlipidemia with target LDL less than 70 (Chronic)    Relatively well-controlled on current labs. He should have labs to review being scanned. Will review and report. Continue Pravachol.  Relevant Medications   olmesartan (BENICAR) 20 MG tablet   furosemide (LASIX) 20 MG tablet   Essential hypertension (Chronic)    Well-controlled now on ARB. Converted back from beta blocker to ARB      Relevant Medications   olmesartan (BENICAR) 20 MG tablet   furosemide (LASIX) 20 MG tablet   DOE (dyspnea on exertion)    Stable. Improved. Continue to stay active.      CAD S/P percutaneous coronary angioplasty - RCA DES 11/25/13 - Primary (Chronic)    No significant anginal symptoms. Doing well on Plavix. Now back on ARB and no longer on beta blocker due to intolerance. No recurrent symptoms concerning for angina. On statin.      Relevant Medications   olmesartan (BENICAR) 20 MG tablet   furosemide (LASIX) 20 MG tablet   AAA (abdominal aortic aneurysm) ([place])    Not sure if this is related to chart for or not. I don't see that has been evaluated in the long time. We will check abdominal aortic ultrasound. He will need to be contacted for scheduling.      Relevant Medications   olmesartan (BENICAR) 20 MG tablet   furosemide (LASIX) 20 MG tablet      Current medicines are reviewed at length with the patient today. (+/- concerns) none The following changes have been made: None Okay to take Lasix 20 mg daily. We will change the prescription to daily as opposed to every other day.  Studies Ordered:   No orders of the defined types were placed in this encounter.    Follow-up in 6 months.   Leonie Man, M.D., M.S. Interventional Cardiologist   Pager # [phone] Phone # [phone] [address] [place], [place] [address]

## 2015-12-26 NOTE — Assessment & Plan Note (Signed)
No significant anginal symptoms. Doing well on Plavix. Now back on ARB and no longer on beta blocker due to intolerance. No recurrent symptoms concerning for angina. On statin.

## 2015-12-26 NOTE — Assessment & Plan Note (Signed)
No longer seem to be a problem. Nothing significant noted on event monitor. No longer on beta blocker.

## 2015-12-27 ENCOUNTER — Other Ambulatory Visit: Payer: Self-pay

## 2015-12-27 DIAGNOSIS — I714 Abdominal aortic aneurysm, without rupture, unspecified: Secondary | ICD-10-CM

## 2015-12-28 ENCOUNTER — Telehealth: Payer: Self-pay | Admitting: Cardiology

## 2015-12-28 NOTE — Telephone Encounter (Signed)
Patient thinks someone left hom a vm but he is not sure if this is an old msg.  No note in to indicate a call back needed.  Patient aware and is satisfied.  Thinks this is probably and old recording.

## 2016-01-01 ENCOUNTER — Other Ambulatory Visit: Payer: Self-pay

## 2016-01-03 ENCOUNTER — Telehealth: Payer: Self-pay | Admitting: Cardiology

## 2016-01-03 ENCOUNTER — Other Ambulatory Visit: Payer: Self-pay

## 2016-01-03 DIAGNOSIS — I714 Abdominal aortic aneurysm, without rupture, unspecified: Secondary | ICD-10-CM

## 2016-01-03 DIAGNOSIS — I159 Secondary hypertension, unspecified: Secondary | ICD-10-CM

## 2016-01-03 NOTE — Telephone Encounter (Signed)
CT Angiogram of Aorta - with contrast    DH   S/w pt regarding Dr. Allison Quarry recommendations. Appt scheduled March 8, 2pm, arrival 1:45pm. Clear liquids 4 hours prior.  Pt needs BMET Order placed. Pt states he will go to [facility] March 3 for labs. Pt verbalized understanding and agreeable with plan.

## 2016-01-04 ENCOUNTER — Other Ambulatory Visit
Admission: RE | Admit: 2016-01-04 | Discharge: 2016-01-04 | Disposition: A | Discharge status: Home / Self Care | Payer: Medicare Other | Source: Ambulatory Visit | Attending: Cardiology | Admitting: Cardiology

## 2016-01-04 DIAGNOSIS — I714 Abdominal aortic aneurysm, without rupture: Secondary | ICD-10-CM | POA: Diagnosis not present

## 2016-01-04 DIAGNOSIS — I159 Secondary hypertension, unspecified: Secondary | ICD-10-CM | POA: Diagnosis not present

## 2016-01-04 LAB — BASIC METABOLIC PANEL
Anion gap: 6 (ref 5–15)
BUN: 18 mg/dL (ref 6–20)
CHLORIDE: 101 mmol/L (ref 101–111)
CO2: 30 mmol/L (ref 22–32)
CREATININE: 1.27 mg/dL — AB (ref 0.61–1.24)
Calcium: 9.2 mg/dL (ref 8.9–10.3)
GFR calc non Af Amer: 53 mL/min — ABNORMAL LOW (ref >60)
Glucose, Bld: 110 mg/dL — ABNORMAL HIGH (ref 65–99)
POTASSIUM: 4.6 mmol/L (ref 3.5–5.1)
Sodium: 137 mmol/L (ref 135–145)

## 2016-01-07 DIAGNOSIS — I10 Essential (primary) hypertension: Secondary | ICD-10-CM | POA: Diagnosis not present

## 2016-01-07 DIAGNOSIS — Z79899 Other long term (current) drug therapy: Secondary | ICD-10-CM | POA: Diagnosis not present

## 2016-01-07 DIAGNOSIS — R7303 Prediabetes: Secondary | ICD-10-CM | POA: Diagnosis not present

## 2016-01-07 DIAGNOSIS — Z125 Encounter for screening for malignant neoplasm of prostate: Secondary | ICD-10-CM | POA: Diagnosis not present

## 2016-01-07 DIAGNOSIS — E291 Testicular hypofunction: Secondary | ICD-10-CM | POA: Diagnosis not present

## 2016-01-07 DIAGNOSIS — E669 Obesity, unspecified: Secondary | ICD-10-CM | POA: Diagnosis not present

## 2016-01-07 DIAGNOSIS — E782 Mixed hyperlipidemia: Secondary | ICD-10-CM | POA: Diagnosis not present

## 2016-01-08 ENCOUNTER — Other Ambulatory Visit: Payer: Self-pay

## 2016-01-08 DIAGNOSIS — I714 Abdominal aortic aneurysm, without rupture, unspecified: Secondary | ICD-10-CM

## 2016-01-09 ENCOUNTER — Ambulatory Visit
Admission: RE | Admit: 2016-01-09 | Discharge: 2016-01-09 | Disposition: A | Discharge status: Home / Self Care | Payer: Medicare Other | Source: Ambulatory Visit | Attending: Cardiology | Admitting: Cardiology

## 2016-01-09 DIAGNOSIS — I77811 Abdominal aortic ectasia: Secondary | ICD-10-CM | POA: Diagnosis not present

## 2016-01-09 DIAGNOSIS — I714 Abdominal aortic aneurysm, without rupture, unspecified: Secondary | ICD-10-CM

## 2016-01-09 DIAGNOSIS — I701 Atherosclerosis of renal artery: Secondary | ICD-10-CM | POA: Insufficient documentation

## 2016-01-09 MED ORDER — IOHEXOL 350 MG/ML SOLN
85.0000 mL | Freq: Once | INTRAVENOUS | Status: AC | PRN
  Administered 2016-01-09: 85 mL via INTRAVENOUS

## 2016-01-10 ENCOUNTER — Other Ambulatory Visit: Payer: Self-pay

## 2016-01-10 DIAGNOSIS — I714 Abdominal aortic aneurysm, without rupture, unspecified: Secondary | ICD-10-CM

## 2016-01-14 ENCOUNTER — Telehealth: Payer: Self-pay | Admitting: Cardiology

## 2016-01-14 ENCOUNTER — Other Ambulatory Visit: Payer: Self-pay

## 2016-01-14 DIAGNOSIS — E860 Dehydration: Secondary | ICD-10-CM

## 2016-01-14 NOTE — Telephone Encounter (Signed)
Notes Recorded by Leonie Man, MD on 01/12/2016 at 1:49 PM These labs make him look a little bit dehydrated. I would make sure he maintains adequate hydration and hold Lasix for a couple days. We can recheck chemistry early next week.  S/w pt who reports just getting over a stomach bug. Reviewed labs and instructions. Pt verbalized understanding and states he will be able to hydrate as he is feeling better. He would like his repeat labs next week to be drawn at [facility]. Lab order placed.

## 2016-01-17 DIAGNOSIS — Z6833 Body mass index (BMI) 33.0-33.9, adult: Secondary | ICD-10-CM | POA: Diagnosis not present

## 2016-01-17 DIAGNOSIS — J101 Influenza due to other identified influenza virus with other respiratory manifestations: Secondary | ICD-10-CM | POA: Diagnosis not present

## 2016-01-17 DIAGNOSIS — E669 Obesity, unspecified: Secondary | ICD-10-CM | POA: Diagnosis not present

## 2016-01-21 DIAGNOSIS — E291 Testicular hypofunction: Secondary | ICD-10-CM | POA: Diagnosis not present

## 2016-01-22 ENCOUNTER — Other Ambulatory Visit
Admission: RE | Admit: 2016-01-22 | Discharge: 2016-01-22 | Disposition: A | Discharge status: Home / Self Care | Payer: Medicare Other | Source: Ambulatory Visit | Attending: Cardiology | Admitting: Cardiology

## 2016-01-22 DIAGNOSIS — E860 Dehydration: Secondary | ICD-10-CM | POA: Diagnosis not present

## 2016-01-22 LAB — BASIC METABOLIC PANEL
ANION GAP: 7 (ref 5–15)
BUN: 18 mg/dL (ref 6–20)
CALCIUM: 8.8 mg/dL — AB (ref 8.9–10.3)
CHLORIDE: 105 mmol/L (ref 101–111)
CO2: 23 mmol/L (ref 22–32)
CREATININE: 1.22 mg/dL (ref 0.61–1.24)
GFR calc non Af Amer: 56 mL/min — ABNORMAL LOW (ref >60)
Glucose, Bld: 100 mg/dL — ABNORMAL HIGH (ref 65–99)
Potassium: 4.5 mmol/L (ref 3.5–5.1)
SODIUM: 135 mmol/L (ref 135–145)

## 2016-01-24 ENCOUNTER — Telehealth: Payer: Self-pay | Admitting: *Deleted

## 2016-01-24 NOTE — Telephone Encounter (Signed)
Spoke to patient's wife. Result given . Verbalized understanding Any question may call back

## 2016-01-24 NOTE — Telephone Encounter (Signed)
-----   Message from Skeet Latch, MD sent at 01/24/2016 10:06 AM EDT ----- Kidney function stable.  Calcium levels are slightly low.  Continue current meds.

## 2016-02-05 DIAGNOSIS — E291 Testicular hypofunction: Secondary | ICD-10-CM | POA: Diagnosis not present

## 2016-02-19 DIAGNOSIS — E291 Testicular hypofunction: Secondary | ICD-10-CM | POA: Diagnosis not present

## 2016-02-27 DIAGNOSIS — G4719 Other hypersomnia: Secondary | ICD-10-CM | POA: Diagnosis not present

## 2016-02-27 DIAGNOSIS — M791 Myalgia: Secondary | ICD-10-CM | POA: Diagnosis not present

## 2016-02-27 DIAGNOSIS — R5383 Other fatigue: Secondary | ICD-10-CM | POA: Diagnosis not present

## 2016-02-27 DIAGNOSIS — Z79899 Other long term (current) drug therapy: Secondary | ICD-10-CM | POA: Diagnosis not present

## 2016-02-28 DIAGNOSIS — Z96653 Presence of artificial knee joint, bilateral: Secondary | ICD-10-CM | POA: Diagnosis not present

## 2016-03-04 DIAGNOSIS — E291 Testicular hypofunction: Secondary | ICD-10-CM | POA: Diagnosis not present

## 2016-03-09 ENCOUNTER — Other Ambulatory Visit: Payer: Self-pay | Admitting: Cardiology

## 2016-03-10 NOTE — Telephone Encounter (Signed)
Rx Refill

## 2016-03-17 ENCOUNTER — Encounter: Payer: Self-pay | Admitting: Cardiology

## 2016-03-17 NOTE — Telephone Encounter (Signed)
This encounter was created in error - please disregard.

## 2016-03-18 DIAGNOSIS — E291 Testicular hypofunction: Secondary | ICD-10-CM | POA: Diagnosis not present

## 2016-03-31 ENCOUNTER — Ambulatory Visit (HOSPITAL_BASED_OUTPATIENT_CLINIC_OR_DEPARTMENT_OTHER): Payer: Medicare Other | Attending: Family Medicine | Admitting: Internal Medicine

## 2016-03-31 VITALS — Ht 71.0 in | Wt 220.0 lb

## 2016-03-31 DIAGNOSIS — G4710 Hypersomnia, unspecified: Secondary | ICD-10-CM

## 2016-03-31 DIAGNOSIS — I493 Ventricular premature depolarization: Secondary | ICD-10-CM | POA: Diagnosis not present

## 2016-03-31 DIAGNOSIS — R0683 Snoring: Secondary | ICD-10-CM | POA: Diagnosis not present

## 2016-03-31 DIAGNOSIS — G4719 Other hypersomnia: Secondary | ICD-10-CM | POA: Insufficient documentation

## 2016-03-31 DIAGNOSIS — I10 Essential (primary) hypertension: Secondary | ICD-10-CM | POA: Insufficient documentation

## 2016-03-31 DIAGNOSIS — R5383 Other fatigue: Secondary | ICD-10-CM

## 2016-04-01 DIAGNOSIS — E291 Testicular hypofunction: Secondary | ICD-10-CM | POA: Diagnosis not present

## 2016-04-06 DIAGNOSIS — G4710 Hypersomnia, unspecified: Secondary | ICD-10-CM

## 2016-04-06 DIAGNOSIS — R5383 Other fatigue: Secondary | ICD-10-CM | POA: Diagnosis not present

## 2016-04-06 NOTE — Procedures (Signed)
  Patient Name: Johnny, Kane Date: 03/31/2016 Gender: Male D.O.B: 09/19/1941 Age (years): 78 Referring Provider: Cristela Blue [name] Height (inches): 71 Interpreting Physician: Baird Lyons MD, ABSM Weight (lbs): 220 RPSGT: Baxter Flattery BMI: 31 MRN: [id number] Neck Size: 18.50 CLINICAL INFORMATION Sleep Study Type: NPSG Indication for sleep study: Fatigue, Hypertension, Snoring Epworth Sleepiness Score: 8  SLEEP STUDY TECHNIQUE As per the AASM Manual for the Scoring of Sleep and Associated Events v2.3 (April 2016) with a hypopnea requiring 4% desaturations. The channels recorded and monitored were frontal, central and occipital EEG, electrooculogram (EOG), submentalis EMG (chin), nasal and oral airflow, thoracic and abdominal wall motion, anterior tibialis EMG, snore microphone, electrocardiogram, and pulse oximetry.  MEDICATIONS Patient's medications include: charted for review. Medications self-administered by patient during sleep study : No sleep medicine administered.  SLEEP ARCHITECTURE The study was initiated at 10:26:47 PM and ended at 5:11:20 AM. Sleep onset time was 7.7 minutes and the sleep efficiency was 93.1%. The total sleep time was 376.5 minutes. Stage REM latency was 287.0 minutes. The patient spent 11.95% of the night in stage N1 sleep, 85.26% in stage N2 sleep, 0.00% in stage N3 and 2.79% in REM. Alpha intrusion was absent. Supine sleep was 75.30%.  RESPIRATORY PARAMETERS The overall apnea/hypopnea index (AHI) was 2.5 per hour. There were 5 total apneas, including 3 obstructive, 1 central and 1 mixed apneas. There were 11 hypopneas and 24 RERAs. The AHI during Stage REM sleep was 34.3 per hour. AHI while supine was 3.0 per hour. The mean oxygen saturation was 91.39%. The minimum SpO2 during sleep was 84.00%. Loud snoring was noted during this study.  CARDIAC DATA The 2 lead EKG demonstrated sinus rhythm. The mean heart rate was 64.61 beats per minute.  Other EKG findings include: rare PVC.  LEG MOVEMENT DATA The total PLMS were 562 with a resulting PLMS index of 89.56. Associated arousal with leg movement index was 3.5 .  IMPRESSIONS - No significant obstructive sleep apnea occurred during this study (AHI = 2.5/h). - No significant central sleep apnea occurred during this study (CAI = 0.2/h). - Mild oxygen desaturation was noted during this study (Min O2 = 84.00%). - The patient snored with Loud snoring volume. - No cardiac abnormalities were noted during this study. - Severe periodic limb movements of sleep occurred during the study. Minimal associated arousals.  DIAGNOSIS - Primary Snoring (786.09 [R06.83 ICD-10]) - Normal study  RECOMMENDATIONS - Avoid alcohol, sedatives and other CNS depressants that may worsen sleep apnea and disrupt normal sleep architecture. - Sleep hygiene should be reviewed to assess factors that may improve sleep quality. - Weight management and regular exercise should be initiated or continued if appropriate.  Deneise Lever Diplomate, American Board of Sleep Medicine  ELECTRONICALLY SIGNED ON:  04/06/2016, 9:02 AM [place] PH: [phone]   FX: [phone] [place]

## 2016-04-09 DIAGNOSIS — R531 Weakness: Secondary | ICD-10-CM | POA: Diagnosis not present

## 2016-04-09 DIAGNOSIS — I2510 Atherosclerotic heart disease of native coronary artery without angina pectoris: Secondary | ICD-10-CM | POA: Diagnosis not present

## 2016-04-15 DIAGNOSIS — E291 Testicular hypofunction: Secondary | ICD-10-CM | POA: Diagnosis not present

## 2016-04-23 DIAGNOSIS — L0291 Cutaneous abscess, unspecified: Secondary | ICD-10-CM | POA: Diagnosis not present

## 2016-04-29 DIAGNOSIS — E291 Testicular hypofunction: Secondary | ICD-10-CM | POA: Diagnosis not present

## 2016-05-12 DIAGNOSIS — R5382 Chronic fatigue, unspecified: Secondary | ICD-10-CM | POA: Diagnosis not present

## 2016-05-13 DIAGNOSIS — E291 Testicular hypofunction: Secondary | ICD-10-CM | POA: Diagnosis not present

## 2016-05-27 DIAGNOSIS — E291 Testicular hypofunction: Secondary | ICD-10-CM | POA: Diagnosis not present

## 2016-06-10 ENCOUNTER — Other Ambulatory Visit: Payer: Self-pay | Admitting: Cardiology

## 2016-06-10 DIAGNOSIS — E291 Testicular hypofunction: Secondary | ICD-10-CM | POA: Diagnosis not present

## 2016-06-10 NOTE — Telephone Encounter (Signed)
Rx has been sent to the pharmacy electronically. ° °

## 2016-06-18 ENCOUNTER — Ambulatory Visit (INDEPENDENT_AMBULATORY_CARE_PROVIDER_SITE_OTHER): Payer: Medicare Other | Admitting: Cardiology

## 2016-06-18 ENCOUNTER — Encounter: Payer: Self-pay | Admitting: Cardiology

## 2016-06-18 VITALS — BP 130/80 | HR 84 | Ht 71.0 in | Wt 226.0 lb

## 2016-06-18 DIAGNOSIS — I714 Abdominal aortic aneurysm, without rupture, unspecified: Secondary | ICD-10-CM

## 2016-06-18 DIAGNOSIS — I493 Ventricular premature depolarization: Secondary | ICD-10-CM | POA: Diagnosis not present

## 2016-06-18 DIAGNOSIS — I10 Essential (primary) hypertension: Secondary | ICD-10-CM

## 2016-06-18 DIAGNOSIS — I2510 Atherosclerotic heart disease of native coronary artery without angina pectoris: Secondary | ICD-10-CM | POA: Diagnosis not present

## 2016-06-18 DIAGNOSIS — E785 Hyperlipidemia, unspecified: Secondary | ICD-10-CM

## 2016-06-18 DIAGNOSIS — R0609 Other forms of dyspnea: Secondary | ICD-10-CM | POA: Diagnosis not present

## 2016-06-18 DIAGNOSIS — Z9861 Coronary angioplasty status: Secondary | ICD-10-CM

## 2016-06-18 MED ORDER — NITROGLYCERIN 0.4 MG SL SUBL: 0.4000 mg | SUBLINGUAL_TABLET | SUBLINGUAL | 2 refills | Status: DC | PRN

## 2016-06-18 NOTE — Patient Instructions (Signed)
Medication Instructions:  Your physician recommends that you continue on your current medications as directed. Please refer to the Current Medication list given to you today.   Labwork: none  Testing/Procedures: none  Follow-Up: Your physician wants you to follow-up in: six months with Dr. Ellyn Hack in [place]. You will receive a reminder letter in the mail two months in advance. If you don't receive a letter, please call our office to schedule the follow-up appointment.   Any Other Special Instructions Will Be Listed Below (If Applicable).     If you need a refill on your cardiac medications before your next appointment, please call your pharmacy.

## 2016-06-18 NOTE — Progress Notes (Signed)
PCP: Leonides Sake, MD  Clinic Note: Chief Complaint  Patient presents with  . Other    6 month follow up. Meds reviewed by the patient verbally. "doing well."   . Coronary Artery Disease    Status post PCI  . Palpitations    Symptomatic PVCs. Now well-controlled.    HPI: Johnny Kane is a 75 y.o. male with a PMH below who presents today for 6 month f/u of CAD-PCI to RCA in Jan 2015 & palpitations / PVCs. Has been concerned with memory loss & Palpitations / bradycardia  Johnny Kane was last seen on 12/26/2015 -- he was doing fairly well. No further episodes of PVCs. Beta blocker had been stopped. Still notices some exertional dyspnea, but not very bad.  Recent Hospitalizations: None  Studies Reviewed: No new studies See social history. Now currently legally separated from wife.  Interval History: Johnny Kane presents today follow-up. He actually is doing fairly well. No major complaints with exception of some chest discomfort associated with distress with his wife leaving him. He has been very agitated and upset. Noted a little bit of palpitations, but that has resolved.  The palpitations are better even though he is no longer on the metoprolol. He was taken off the metoprolol for strange nightmare type symptoms that have now gone away. His blood pressure has been relatively well-controlled on Benicar.  He still indicates that if he pushes himself for a while he will get a little bit short of breath but he is still able to walk at a slow rate for a long time without problems. He can go up and down steps without problems. His energy level is definitely improved with Provigil.  Overall from a cardiac standpoint he is stable with a cardiac review of symptoms as follows: He notes occasional twinges of chest and his left upper chest when he is active or stressed, but this is very rare. No PND,  with mild controllable edema using essentially daily Lasix 20 mg.  He sleeps in a recliner  due to GERD issues, therefore really unsure at this any orthopnea. He has noted a bulging varicose veins left leg. This is not uncomfortable. No significant palpitations, lightheadedness, dizziness, weakness or syncope/near syncope - since hearing that the evaluations were negative No TIA/amaurosis fugax symptoms. No melena, hematochezia, hematuria, or epstaxis. No claudication.  ROS: A comprehensive was performed. Review of Systems  Constitutional: Negative for malaise/fatigue.  HENT: Negative for nosebleeds.   Respiratory: Positive for shortness of breath (With significant exertion).   Gastrointestinal: Negative for blood in stool and melena.  Genitourinary: Negative for hematuria.  Musculoskeletal: Positive for back pain.  Skin: Negative for rash.  Neurological: Negative for dizziness, loss of consciousness (No further episodes), weakness and headaches.       No further near syncopal episodes. He apparently had 2 episodes where he felt as though he "fell asleep standing"  Psychiatric/Behavioral: Positive for depression. The patient is nervous/anxious (overall improved).        Having a hard time dealing with the legal separation with his wife. The final move out day was very stressful for him.  All other systems reviewed and are negative.   Past Medical History:  Diagnosis Date  . Aortic calcification (HCC)    No evidence of AAA on CT Abd in 2013  . Aortic valve sclerosis    a. 12/2013 Echo: Aortic Sclerosis; EF 60-65%, Gr1 DD, mild AI, mildly dil LA.  Marland Kitchen CAD S/P percutaneous coronary  angioplasty    a. 07/2011 Nl MV;  b. 11/2013 Cath: LM nl, LAD 20p, D1/2 nl, LCX 30p, OM1 40, OM2 nl, RCA 90-49m (3.5x18 Xience DES). c. Myoview 04/2014: No ischmia/Infarction, normal EF.  Marland Kitchen Depression   . Diabetes mellitus without complication ([place])   . Diverticulosis of colon   . GERD (gastroesophageal reflux disease)   . Hiatal hernia   . History of MRSA infection   . Hyperlipidemia   . Hypertension    . Low testosterone   . OA (osteoarthritis)   . Palpitations   . Palpitations   . Sleep apnea    a. mild not on cpap   . Vasovagal episode   . Vitamin B 12 deficiency     Past Surgical History:  Procedure Laterality Date  . CHOLECYSTECTOMY N/A 07/20/2013   Procedure: LAPAROSCOPIC CHOLECYSTECTOMY;  Surgeon: Joyice Faster. Cornett, MD;  Location: [facility];  Service: General;  Laterality: N/A;  . CORONARY ANGIOPLASTY  11/25/2013   DES   RCA    . LEFT HEART CATHETERIZATION WITH CORONARY ANGIOGRAM N/A 11/25/2013   Procedure: LEFT HEART CATHETERIZATION WITH CORONARY ANGIOGRAM;  Surgeon: Leonie Man, MD;  Location: [facility] CATH LAB;  Service: Cardiovascular;  Laterality: N/A;  . REPLACEMENT TOTAL KNEE     one on each knee  . TONSILLECTOMY    . TRANSTHORACIC ECHOCARDIOGRAM  February 2015   EF 60-65%, Gr1DD, mild AI with aortic sclerosis, mildly dilated LA.    Prior to Admission medications   Medication Sig Start Date End Date Taking? Authorizing Provider  acetaminophen (TYLENOL) 325 MG tablet Take 2 tablets (650 mg total) by mouth every 4 (four) hours as needed for headache or mild pain. 11/26/13  Yes Luke K Kilroy, PA-C  citalopram (CELEXA) 40 MG tablet Take 40 mg by mouth daily.   Yes Historical Provider, MD  clindamycin (CLEOCIN) 300 MG capsule Take 300 mg by mouth as needed (dental procedures).  07/28/13  Yes Historical Provider, MD  clopidogrel (PLAVIX) 75 MG tablet TAKE 1 TABLET BY MOUTH EVERY DAY 06/10/16  Yes Minus Breeding, MD  doxycycline (VIBRAMYCIN) 100 MG capsule Take 100 mg by mouth 2 (two) times daily. 07/02/15  Yes Historical Provider, MD  famotidine (PEPCID) 40 MG tablet Take 40 mg by mouth daily.   Yes Historical Provider, MD  Fexofenadine HCl (MUCINEX ALLERGY PO) Take 1 tablet by mouth 2 (two) times daily.   Yes Historical Provider, MD  furosemide (LASIX) 20 MG tablet Take 1 tablet (20 mg total) by mouth daily. 12/26/15  Yes Leonie Man, MD  modafinil (PROVIGIL) 200 MG tablet Take  100 mg by mouth daily.    Yes Historical Provider, MD  olmesartan (BENICAR) 20 MG tablet Take 40 mg by mouth daily. 10/15/15  Yes Historical Provider, MD  rosuvastatin (CRESTOR) 5 MG tablet  04/09/16  Yes Historical Provider, MD  testosterone cypionate (DEPOTESTOTERONE CYPIONATE) 100 MG/ML injection Inject 100 mg into the muscle every 14 (fourteen) days. For IM use only   Yes Historical Provider, MD     Allergies  Allergen Reactions  . Ciprofloxacin Hives and Itching  . Esomeprazole Magnesium     unknown  . Penicillins     unknown  . Soma [Carisoprodol]     unknown  . Sulfa Drugs Cross Reactors     unknown    Social History   Social History  . Marital status: Married    Spouse name: N/A  . Number of children: 2  . Years of  education: N/A   Occupational History  . [employer]     retired   Social History Main Topics  . Smoking status: Former Smoker    Years: 11.00    Quit date: 11/04/1967  . Smokeless tobacco: Never Used  . Alcohol use No  . Drug use: No  . Sexual activity: Not Asked   Other Topics Concern  . None   Social History Narrative   Married father of 2. Retired from [employer].   Former smoker - quit in 1969 after 11 years; denies alcohol consumption  -- Just as of this week, his wife of ~50 yrs decided to move out of the house to her sister's house. Legal separation filed. Lots of stress.   Family History  Problem Relation Age of Onset  . Asthma Mother   . Hypertension Mother   . Heart attack Father      Wt Readings from Last 3 Encounters:  06/18/16 226 lb (102.5 kg)  03/31/16 220 lb (99.8 kg)  12/26/15 230 lb 12.8 oz (104.7 kg)    PHYSICAL EXAM BP 130/80 (BP Location: Left Arm, Patient Position: Sitting, Cuff Size: Normal)   Pulse 84   Ht 5\' 11"  (1.803 m)   Wt 226 lb (102.5 kg)   BMI 31.52 kg/m  General appearance: alert, cooperative, appears stated age, no distress and mildly obese; normal mood and affect.  Neck: no adenopathy, no JVD ; possible right  carotid bruit. Would need to assess again prior to considering Dopplers. Lungs: clear to auscultation bilaterally, normal percussion bilaterally and non-labored  Heart: regular rate and rhythm with intermittent ectopy, S1, S2 normal, no murmur, click, rub or gallop; nondisplaced PMI  Abdomen: soft, non-tender; bowel sounds normal; no masses, no organomegaly;  Extremities: extremities normal, atraumatic, no cyanosis or edema  Pulses: 2+ and symmetric;  Neurologic: Mental status: Alert, oriented, thought content appropriate; moves all 4 extremities.  Cranial nerves: normal (II-XII grossly intact)    Adult ECG Report NSR, 84. RBBB - stable EKG  Other studies Reviewed: Additional studies/ records that were reviewed today include:  Recent Labs:  Due to be checked by PCP. No results found for: CHOL, HDL, LDLCALC, LDLDIRECT, TRIG, CHOLHDL   ASSESSMENT / PLAN: Problem List Items Addressed This Visit    Symptomatic PVCs (Chronic)    Seems to be no longer an issue. May been stress related. For now continue to avoid beta blocker. No events noted on monitor when evaluated.      Relevant Medications   rosuvastatin (CRESTOR) 5 MG tablet   nitroGLYCERIN (NITROSTAT) 0.4 MG SL tablet   Other Relevant Orders   EKG 12-Lead (Completed)   Hyperlipidemia with target LDL less than 70 (Chronic)    On stable dose of Crestor without any symptoms. Due to have labs checked next week or so by PCP.      Relevant Medications   rosuvastatin (CRESTOR) 5 MG tablet   nitroGLYCERIN (NITROSTAT) 0.4 MG SL tablet   Essential hypertension (Chronic)    Well-controlled on current dose of ARB. No longer on beta blocker.      Relevant Medications   rosuvastatin (CRESTOR) 5 MG tablet   nitroGLYCERIN (NITROSTAT) 0.4 MG SL tablet   Other Relevant Orders   EKG 12-Lead (Completed)   DOE (dyspnea on exertion)   CAD S/P percutaneous coronary angioplasty - RCA DES 11/25/13 (Chronic)    Really no true anginal  symptoms. I think he had some symptoms with stress, but not consistent with angina. Now  back on ARB and off of beta blocker. On Plavix alone. On stable dose of statin.      Relevant Medications   rosuvastatin (CRESTOR) 5 MG tablet   nitroGLYCERIN (NITROSTAT) 0.4 MG SL tablet   Other Relevant Orders   EKG 12-Lead (Completed)   AAA (abdominal aortic aneurysm) (HCC) - Primary (Chronic)    We'll order abdominal ultrasound to be checked after next follow-up.      Relevant Medications   rosuvastatin (CRESTOR) 5 MG tablet   nitroGLYCERIN (NITROSTAT) 0.4 MG SL tablet   Other Relevant Orders   EKG 12-Lead (Completed)    Other Visit Diagnoses   None.     Current medicines are reviewed at length with the patient today. (+/- concerns) none The following changes have been made: None Medication Instructions:  Your physician recommends that you continue on your current medications as directed. Please refer to the Current Medication list given to you today.   Labwork: none  Testing/Procedures: none  Follow-Up: Your physician wants you to follow-up in: six months with Dr. Ellyn Hack in [place].   Studies Ordered:   Orders Placed This Encounter  Procedures  . EKG 12-Lead    Follow-up in 6 months.   Johnny Kane, M.D., M.S. Interventional Cardiologist   Pager # [phone] Phone # [phone] [address] [place], [place] [address]

## 2016-06-21 ENCOUNTER — Encounter: Payer: Self-pay | Admitting: Cardiology

## 2016-06-21 NOTE — Assessment & Plan Note (Signed)
On stable dose of Crestor without any symptoms. Due to have labs checked next week or so by PCP.

## 2016-06-21 NOTE — Assessment & Plan Note (Signed)
Seems to be no longer an issue. May been stress related. For now continue to avoid beta blocker. No events noted on monitor when evaluated.

## 2016-06-21 NOTE — Assessment & Plan Note (Signed)
Well-controlled on current dose of ARB. No longer on beta blocker.

## 2016-06-21 NOTE — Assessment & Plan Note (Signed)
Really no true anginal symptoms. I think he had some symptoms with stress, but not consistent with angina. Now back on ARB and off of beta blocker. On Plavix alone. On stable dose of statin.

## 2016-06-21 NOTE — Assessment & Plan Note (Signed)
We'll order abdominal ultrasound to be checked after next follow-up.

## 2016-06-24 DIAGNOSIS — E291 Testicular hypofunction: Secondary | ICD-10-CM | POA: Diagnosis not present

## 2016-06-26 DIAGNOSIS — R490 Dysphonia: Secondary | ICD-10-CM | POA: Diagnosis not present

## 2016-07-08 DIAGNOSIS — E291 Testicular hypofunction: Secondary | ICD-10-CM | POA: Diagnosis not present

## 2016-07-10 DIAGNOSIS — E782 Mixed hyperlipidemia: Secondary | ICD-10-CM | POA: Diagnosis not present

## 2016-07-10 DIAGNOSIS — N183 Chronic kidney disease, stage 3 (moderate): Secondary | ICD-10-CM | POA: Diagnosis not present

## 2016-07-10 DIAGNOSIS — R7303 Prediabetes: Secondary | ICD-10-CM | POA: Diagnosis not present

## 2016-07-10 DIAGNOSIS — E291 Testicular hypofunction: Secondary | ICD-10-CM | POA: Diagnosis not present

## 2016-07-10 DIAGNOSIS — Z23 Encounter for immunization: Secondary | ICD-10-CM | POA: Diagnosis not present

## 2016-07-10 DIAGNOSIS — I10 Essential (primary) hypertension: Secondary | ICD-10-CM | POA: Diagnosis not present

## 2016-07-10 DIAGNOSIS — Z9181 History of falling: Secondary | ICD-10-CM | POA: Diagnosis not present

## 2016-07-11 DIAGNOSIS — R1310 Dysphagia, unspecified: Secondary | ICD-10-CM | POA: Diagnosis not present

## 2016-07-11 DIAGNOSIS — K219 Gastro-esophageal reflux disease without esophagitis: Secondary | ICD-10-CM | POA: Diagnosis not present

## 2016-07-11 DIAGNOSIS — R490 Dysphonia: Secondary | ICD-10-CM | POA: Diagnosis not present

## 2016-07-22 DIAGNOSIS — E291 Testicular hypofunction: Secondary | ICD-10-CM | POA: Diagnosis not present

## 2016-07-30 DIAGNOSIS — L570 Actinic keratosis: Secondary | ICD-10-CM | POA: Diagnosis not present

## 2016-08-05 DIAGNOSIS — E291 Testicular hypofunction: Secondary | ICD-10-CM | POA: Diagnosis not present

## 2016-08-19 DIAGNOSIS — E291 Testicular hypofunction: Secondary | ICD-10-CM | POA: Diagnosis not present

## 2016-08-25 ENCOUNTER — Other Ambulatory Visit: Payer: Self-pay | Admitting: Cardiology

## 2016-08-25 NOTE — Telephone Encounter (Signed)
Rx has been sent to the pharmacy electronically. ° °

## 2016-09-02 DIAGNOSIS — E291 Testicular hypofunction: Secondary | ICD-10-CM | POA: Diagnosis not present

## 2016-10-03 HISTORY — PX: NM MYOVIEW LTD: HXRAD82

## 2016-10-13 ENCOUNTER — Ambulatory Visit (INDEPENDENT_AMBULATORY_CARE_PROVIDER_SITE_OTHER): Payer: Medicare Other | Admitting: Cardiology

## 2016-10-13 ENCOUNTER — Encounter: Payer: Self-pay | Admitting: Cardiology

## 2016-10-13 ENCOUNTER — Telehealth: Payer: Self-pay | Admitting: Cardiology

## 2016-10-13 VITALS — BP 152/80 | HR 84 | Ht 71.0 in | Wt 212.0 lb

## 2016-10-13 DIAGNOSIS — E785 Hyperlipidemia, unspecified: Secondary | ICD-10-CM

## 2016-10-13 DIAGNOSIS — R079 Chest pain, unspecified: Secondary | ICD-10-CM | POA: Diagnosis not present

## 2016-10-13 DIAGNOSIS — I2510 Atherosclerotic heart disease of native coronary artery without angina pectoris: Secondary | ICD-10-CM | POA: Diagnosis not present

## 2016-10-13 DIAGNOSIS — I10 Essential (primary) hypertension: Secondary | ICD-10-CM | POA: Diagnosis not present

## 2016-10-13 DIAGNOSIS — Z9861 Coronary angioplasty status: Secondary | ICD-10-CM

## 2016-10-13 MED ORDER — OLMESARTAN MEDOXOMIL 20 MG PO TABS: 40.0000 mg | ORAL_TABLET | Freq: Every day | ORAL | 1 refills | Status: DC

## 2016-10-13 NOTE — Telephone Encounter (Signed)
Spoke with pt states that he has been having increased chest pain lately but is not having currently. Appt scheduled for today @230pm 

## 2016-10-13 NOTE — Patient Instructions (Signed)
Medication Instructions: Increase Olmesartan (Benicar) to 40 mg daily.  Testing/Procedures: Your physician has requested that you have a lexiscan myoview. For further information please visit [email or web address]. Please follow instruction sheet, as given.   Follow-Up: Your physician recommends that you schedule a follow-up appointment in: 4 weeks with Dr. Ellyn Hack.  Any Other Special Instructions will be listed below:  Pharmacologic Stress Electrocardiogram A pharmacologic stress electrocardiogram is a heart (cardiac) test that uses nuclear imaging to evaluate the blood supply to your heart. This test may also be called a pharmacologic stress electrocardiography. Pharmacologic means that a medicine is used to increase your heart rate and blood pressure.  This stress test is done to find areas of poor blood flow to the heart by determining the extent of coronary artery disease (CAD). Some people exercise on a treadmill, which naturally increases the blood flow to the heart. For those people unable to exercise on a treadmill, a medicine is used. This medicine stimulates your heart and will cause your heart to beat harder and more quickly, as if you were exercising.  Pharmacologic stress tests can help determine:  The adequacy of blood flow to your heart during increased levels of activity in order to clear you for discharge home.  The extent of coronary artery blockage caused by CAD.  Your prognosis if you have suffered a heart attack.  The effectiveness of cardiac procedures done, such as an angioplasty, which can increase the circulation in your coronary arteries.  Causes of chest pain or pressure. LET [facility] CARE PROVIDER KNOW ABOUT:  Any allergies you have.  All medicines you are taking, including vitamins, herbs, eye drops, creams, and over-the-counter medicines.  Previous problems you or members of your family have had with the use of anesthetics.  Any blood disorders you  have.  Previous surgeries you have had.  Medical conditions you have.  Possibility of pregnancy, if this applies.  If you are currently breastfeeding. RISKS AND COMPLICATIONS Generally, this is a safe procedure. However, as with any procedure, complications can occur. Possible complications include:  You develop pain or pressure in the following areas:  Chest.  Jaw or neck.  Between your shoulder blades.  Radiating down your left arm.  Headache.  Dizziness or light-headedness.  Shortness of breath.  Increased or irregular heartbeat.  Low blood pressure.  Nausea or vomiting.  Flushing.  Redness going up the arm and slight pain during injection of medicine.  Heart attack (rare). BEFORE THE PROCEDURE   Avoid all forms of caffeine for 24 hours before your test or as directed by your health care provider. This includes coffee, tea (even decaffeinated tea), caffeinated sodas, chocolate, cocoa, and certain pain medicines.  Follow your health care provider's instructions regarding eating and drinking before the test.  Take your medicines as directed at regular times with water unless instructed otherwise. Exceptions may include:  If you have diabetes, ask how you are to take your insulin or pills. It is common to adjust insulin dosing the morning of the test.  If you are taking beta-blocker medicines, it is important to talk to your health care provider about these medicines well before the date of your test. Taking beta-blocker medicines may interfere with the test. In some cases, these medicines need to be changed or stopped 24 hours or more before the test.  If you wear a nitroglycerin patch, it may need to be removed prior to the test. Ask your health care provider if the patch should be  removed before the test.  If you use an inhaler for any breathing condition, bring it with you to the test.  If you are an outpatient, bring a snack so you can eat right after the  stress phase of the test.  Do not smoke for 4 hours prior to the test or as directed by your health care provider.  Do not apply lotions, powders, creams, or oils on your chest prior to the test.  Wear comfortable shoes and clothing. Let your health care provider know if you were unable to complete or follow the preparations for your test. PROCEDURE   Multiple patches (electrodes) will be put on your chest. If needed, small areas of your chest may be shaved to get better contact with the electrodes. Once the electrodes are attached to your body, multiple wires will be attached to the electrodes, and your heart rate will be monitored.  An IV access will be started. A nuclear trace (isotope) is given. The isotope may be given intravenously, or it may be swallowed. Nuclear refers to several types of radioactive isotopes, and the nuclear isotope lights up the arteries so that the nuclear images are clear. The isotope is absorbed by your body. This results in low radiation exposure.  A resting nuclear image is taken to show how your heart functions at rest.  A medicine is given through the IV access.  A second scan is done about 1 hour after the medicine injection and determines how your heart functions under stress.  During this stress phase, you will be connected to an electrocardiogram machine. Your blood pressure and oxygen levels will be monitored. AFTER THE PROCEDURE   Your heart rate and blood pressure will be monitored after the test.  You may return to your normal schedule, including diet,activities, and medicines, unless your health care provider tells you otherwise. This information is not intended to replace advice given to you by your health care provider. Make sure you discuss any questions you have with your health care provider. Document Released: 03/08/2009 Document Revised: 10/25/2013 Document Reviewed: 06/27/2013 Elsevier Interactive Patient Education  2017 [employer].   If you need a refill on your cardiac medications before your next appointment, please call your pharmacy.

## 2016-10-13 NOTE — Telephone Encounter (Signed)
New message      Pt is having to use nitro every other day (sometimes daily) for an ache in his arm and weakness.  He is also having some sob.  This has been going on for about 1 week. Pt denies cp. Please advise

## 2016-10-13 NOTE — Progress Notes (Signed)
Cardiology Office Note   Date:  10/13/2016   ID:  BUZ DORGAN, DOB 06/05/1941, MRN [id number]  PCP:  Leonides Sake, MD  Cardiologist:  Dr. Ellyn Hack     Chief Complaint  Patient presents with  . Chest Pain    having some now   . Shortness of Breath    some      History of Present Illness: Johnny Kane is a 75 y.o. male who presents for chest pain.  This began last week and does not occur every day. No associated symptoms.  occurs Lt upper chest and some numbness in Lt 5th finger.-when it occurs he can usually get up and walk and it improves.   If it does not he then takes a NTG and it resolves.  He did not know if he should come in, he thought there was not much to be done for his CAD.  He decided he would like to live longer so he came in.     He has a hx of  CAD-PCI to RCA in Jan 2015 & palpitations / PVCs.Has been concerned with memory loss & Palpitations / bradycardia.   Normal nuc 2015 after stent. Other disease was non obstructive. Marland Kitchen Hx of Bradycardia so not on BB.    His BP is elevated today as well.   On my arrival to room he was having some chest pressure but resolved quickly once he sat up. His BP is eleated so he will increase his benicar to 40 mg.  He is on pepcid.   Last labs were in March. He has NTG  Past Medical History:  Diagnosis Date  . Aortic calcification (HCC)    No evidence of AAA on CT Abd in 2013  . Aortic valve sclerosis    a. 12/2013 Echo: Aortic Sclerosis; EF 60-65%, Gr1 DD, mild AI, mildly dil LA.  Marland Kitchen CAD S/P percutaneous coronary angioplasty    a. 07/2011 Nl MV;  b. 11/2013 Cath: LM nl, LAD 20p, D1/2 nl, LCX 30p, OM1 40, OM2 nl, RCA 90-21m (3.5x18 Xience DES). c. Myoview 04/2014: No ischmia/Infarction, normal EF.  Marland Kitchen Depression   . Diabetes mellitus without complication ([place])   . Diverticulosis of colon   . GERD (gastroesophageal reflux disease)   . Hiatal hernia   . History of MRSA infection   . Hyperlipidemia   . Hypertension   . Low  testosterone   . OA (osteoarthritis)   . Palpitations   . Palpitations   . Sleep apnea    a. mild not on cpap   . Vasovagal episode   . Vitamin B 12 deficiency     Past Surgical History:  Procedure Laterality Date  . CHOLECYSTECTOMY N/A 07/20/2013   Procedure: LAPAROSCOPIC CHOLECYSTECTOMY;  Surgeon: Joyice Faster. Cornett, MD;  Location: [facility];  Service: General;  Laterality: N/A;  . CORONARY ANGIOPLASTY  11/25/2013   DES   RCA    . LEFT HEART CATHETERIZATION WITH CORONARY ANGIOGRAM N/A 11/25/2013   Procedure: LEFT HEART CATHETERIZATION WITH CORONARY ANGIOGRAM;  Surgeon: Leonie Man, MD;  Location: [facility] CATH LAB;  Service: Cardiovascular;  Laterality: N/A;  . REPLACEMENT TOTAL KNEE     one on each knee  . TONSILLECTOMY    . TRANSTHORACIC ECHOCARDIOGRAM  February 2015   EF 60-65%, Gr1DD, mild AI with aortic sclerosis, mildly dilated LA.     Current Outpatient Prescriptions  Medication Sig Dispense Refill  . acetaminophen (TYLENOL) 325 MG tablet Take 2 tablets (  650 mg total) by mouth every 4 (four) hours as needed for headache or mild pain.    . citalopram (CELEXA) 40 MG tablet Take by mouth daily. Pt takes 10 mg    . clindamycin (CLEOCIN) 300 MG capsule Take 300 mg by mouth as needed (dental procedures).     . clopidogrel (PLAVIX) 75 MG tablet TAKE 1 TABLET BY MOUTH EVERY DAY 90 tablet 1  . famotidine (PEPCID) 40 MG tablet Take 40 mg by mouth daily.    . furosemide (LASIX) 20 MG tablet TAKE 1 TABLET (20 MG TOTAL) BY MOUTH DAILY. 30 tablet 5  . modafinil (PROVIGIL) 200 MG tablet Take 100 mg by mouth daily.     . nitroGLYCERIN (NITROSTAT) 0.4 MG SL tablet Place 1 tablet (0.4 mg total) under the tongue every 5 (five) minutes as needed for chest pain. 25 tablet 2  . olmesartan (BENICAR) 20 MG tablet Take 2 tablets (40 mg total) by mouth daily. 180 tablet 1  . rosuvastatin (CRESTOR) 5 MG tablet     . testosterone cypionate (DEPOTESTOTERONE CYPIONATE) 100 MG/ML injection Inject 100 mg into  the muscle every 14 (fourteen) days. For IM use only     No current facility-administered medications for this visit.     Allergies:   Ciprofloxacin; Esomeprazole magnesium; Penicillins; Soma [carisoprodol]; and Sulfa drugs cross reactors    Social History:  The patient  reports that he quit smoking about 48 years ago. He quit after 11.00 years of use. He has never used smokeless tobacco. He reports that he does not drink alcohol or use drugs.   Family History:  The patient's family history includes Asthma in his mother; Heart attack in his father; Hypertension in his mother.    ROS:  General:no colds or fevers, some weight loss Skin:no rashes or ulcers HEENT:no blurred vision, no congestion CV:see HPI PUL:see HPI GI:no diarrhea constipation or melena, no indigestion GU:no hematuria, no dysuria MS:no joint pain, no claudication Neuro:no syncope, no lightheadedness Endo:no diabetes, no thyroid disease  Wt Readings from Last 3 Encounters:  10/13/16 212 lb (96.2 kg)  06/18/16 226 lb (102.5 kg)  03/31/16 220 lb (99.8 kg)     PHYSICAL EXAM: VS:  BP (!) 152/80   Pulse 84   Ht 5\' 11"  (1.803 m)   Wt 212 lb (96.2 kg)   BMI 29.57 kg/m  , BMI Body mass index is 29.57 kg/m. General:Pleasant affect, NAD Skin:Warm and dry, brisk capillary refill HEENT:normocephalic, sclera clear, mucus membranes moist Neck:supple, no JVD, no bruits  Heart:S1S2 RRR without murmur, gallup, rub or click Lungs:clear without rales, rhonchi, or wheezes [id number], non tender, + BS, do not palpate liver spleen or masses Ext:no lower ext edema, 2+ pedal pulses, 2+ radial pulses Neuro:alert and oriented, MAE, follows commands, + facial symmetry    EKG:  EKG is ordered today. The ekg ordered today demonstrates RBBB - + PAC no acute changes   Recent Labs: 01/22/2016: BUN 18; Creatinine, Ser 1.22; Potassium 4.5; Sodium 135    Lipid Panel No results found for: CHOL, TRIG, HDL, CHOLHDL, VLDL, LDLCALC,  LDLDIRECT     Other studies Reviewed: Additional studies/ records that were reviewed today include:   Nuc study 04/12/14 after stent.  Impression Exercise Capacity:  Lexiscan with no exercise. BP Response:  Normal blood pressure response. Clinical Symptoms:  No significant symptoms noted. ECG Impression:  No significant ST segment change suggestive of ischemia. Comparison with Prior Nuclear Study: No significant change from previous study  Overall Impression:  Normal stress nuclear study.  LV Wall Motion:  NL LV Function; NL Wall Motion   Cardiac cath  PROCEDURES PERFORMED:    Left Heart Catheterization Coronary Angiography  Percutaneous Coronary Intervention of the early mid RCA 95% stenosis with a Xience Alpine DES stent 3.5 mm x 18 mm --> postdilated to 3.8 mm POST-OPERATIVE DIAGNOSIS:    Severe single-vessel CAD involving the early portion of the mid RCA  Successful PCI with a single Xience Alpine DES stent 3.5 mm x 18 mm postdilated to 3.8 mm  Mildly elevated LVEDP.   ASSESSMENT AND PLAN:  1. Chest pain that sounds both typical and atypical.  May be GI, discussed cath vs nuc study and he was fine to go ahead with nuc.  He cannot walk on treadmill due to arthritis of his ankles.  If + he would need cath or if neg but symptoms continue we would consider cath. Pt aware if symptoms increase to go to ER for heart cath instead of nuc study.    2.  CAD with RCA stent in 2015.  3. Hyperlipidemia on crestor   4.  Hx of Bradycardia no BB  5. HTN with elevated BP will ask him to go back to benicar 40 mg daily though will need to check his after nuc. Follow up with Dr. Ellyn Hack in 4 weeks.    Current medicines are reviewed with the patient today.  The patient Has no concerns regarding medicines.  The following changes have been made:  See above Labs/ tests ordered today include:see above  Disposition:   FU:  see above  Signed, Cecilie Kicks, NP  10/13/2016 4:08  PM    [facility] [place], [place], [place] Phone: [phone]; Fax: [phone]

## 2016-10-14 ENCOUNTER — Telehealth (HOSPITAL_COMMUNITY): Payer: Self-pay

## 2016-10-14 NOTE — Telephone Encounter (Signed)
Encounter complete. 

## 2016-10-15 ENCOUNTER — Ambulatory Visit (HOSPITAL_COMMUNITY)
Admission: RE | Admit: 2016-10-15 | Discharge: 2016-10-15 | Disposition: A | Discharge status: Home / Self Care | Payer: Medicare Other | Source: Ambulatory Visit | Attending: Cardiovascular Disease | Admitting: Cardiovascular Disease

## 2016-10-15 DIAGNOSIS — R079 Chest pain, unspecified: Secondary | ICD-10-CM | POA: Insufficient documentation

## 2016-10-15 DIAGNOSIS — E118 Type 2 diabetes mellitus with unspecified complications: Secondary | ICD-10-CM | POA: Diagnosis not present

## 2016-10-15 DIAGNOSIS — Z8249 Family history of ischemic heart disease and other diseases of the circulatory system: Secondary | ICD-10-CM | POA: Insufficient documentation

## 2016-10-15 DIAGNOSIS — I714 Abdominal aortic aneurysm, without rupture: Secondary | ICD-10-CM | POA: Insufficient documentation

## 2016-10-15 DIAGNOSIS — I4510 Unspecified right bundle-branch block: Secondary | ICD-10-CM | POA: Insufficient documentation

## 2016-10-15 DIAGNOSIS — R001 Bradycardia, unspecified: Secondary | ICD-10-CM | POA: Insufficient documentation

## 2016-10-15 DIAGNOSIS — I2510 Atherosclerotic heart disease of native coronary artery without angina pectoris: Secondary | ICD-10-CM | POA: Diagnosis not present

## 2016-10-15 DIAGNOSIS — A4902 Methicillin resistant Staphylococcus aureus infection, unspecified site: Secondary | ICD-10-CM | POA: Diagnosis not present

## 2016-10-15 LAB — MYOCARDIAL PERFUSION IMAGING
CHL CUP NUCLEAR SDS: 1
CHL CUP NUCLEAR SSS: 3
CHL CUP RESTING HR STRESS: 66 {beats}/min
CSEPPHR: 93 {beats}/min
LV dias vol: 132 mL (ref 62–150)
LV sys vol: 75 mL
SRS: 2
TID: 1.17

## 2016-10-15 MED ORDER — TECHNETIUM TC 99M TETROFOSMIN IV KIT
30.4000 | PACK | Freq: Once | INTRAVENOUS | Status: AC | PRN
  Administered 2016-10-15: 30.4 via INTRAVENOUS
  Filled 2016-10-15: qty 31

## 2016-10-15 MED ORDER — REGADENOSON 0.4 MG/5ML IV SOLN
0.4000 mg | Freq: Once | INTRAVENOUS | Status: AC
  Administered 2016-10-15: 0.4 mg via INTRAVENOUS

## 2016-10-15 MED ORDER — AMINOPHYLLINE 25 MG/ML IV SOLN
75.0000 mg | Freq: Once | INTRAVENOUS | Status: AC
  Administered 2016-10-15: 75 mg via INTRAVENOUS

## 2016-10-15 MED ORDER — TECHNETIUM TC 99M TETROFOSMIN IV KIT
10.1000 | PACK | Freq: Once | INTRAVENOUS | Status: AC | PRN
  Administered 2016-10-15: 10.1 via INTRAVENOUS
  Filled 2016-10-15: qty 11

## 2016-10-16 ENCOUNTER — Encounter (HOSPITAL_COMMUNITY): Payer: Medicare Other

## 2016-10-20 ENCOUNTER — Ambulatory Visit (INDEPENDENT_AMBULATORY_CARE_PROVIDER_SITE_OTHER): Payer: Medicare Other | Admitting: Cardiology

## 2016-10-20 ENCOUNTER — Encounter: Payer: Self-pay | Admitting: *Deleted

## 2016-10-20 ENCOUNTER — Encounter: Payer: Self-pay | Admitting: Cardiology

## 2016-10-20 VITALS — BP 150/80 | HR 70 | Ht 71.0 in | Wt 218.0 lb

## 2016-10-20 DIAGNOSIS — Z01818 Encounter for other preprocedural examination: Secondary | ICD-10-CM | POA: Diagnosis not present

## 2016-10-20 DIAGNOSIS — R9439 Abnormal result of other cardiovascular function study: Secondary | ICD-10-CM | POA: Diagnosis not present

## 2016-10-20 LAB — CBC
HEMATOCRIT: 53.5 % — AB (ref 38.5–50.0)
HEMOGLOBIN: 18.2 g/dL — AB (ref 13.2–17.1)
MCH: 30.4 pg (ref 27.0–33.0)
MCHC: 34 g/dL (ref 32.0–36.0)
MCV: 89.3 fL (ref 80.0–100.0)
MPV: 10 fL (ref 7.5–12.5)
Platelets: 136 10*3/uL — ABNORMAL LOW (ref 140–400)
RBC: 5.99 MIL/uL — ABNORMAL HIGH (ref 4.20–5.80)
RDW: 13.9 % (ref 11.0–15.0)
WBC: 7.3 10*3/uL (ref 3.8–10.8)

## 2016-10-20 LAB — BASIC METABOLIC PANEL
BUN: 23 mg/dL (ref 7–25)
CO2: 29 mmol/L (ref 20–31)
CREATININE: 1.33 mg/dL — AB (ref 0.70–1.18)
Calcium: 9.8 mg/dL (ref 8.6–10.3)
Chloride: 101 mmol/L (ref 98–110)
GLUCOSE: 94 mg/dL (ref 65–99)
POTASSIUM: 5 mmol/L (ref 3.5–5.3)
Sodium: 139 mmol/L (ref 135–146)

## 2016-10-20 NOTE — Patient Instructions (Signed)
Medication Instructions:   NO CHANGE  Labwork:  Your physician recommends that you HAVE LAB WORK TODAY  Testing/Procedures:  Your physician has requested that you have a cardiac catheterization. Cardiac catheterization is used to diagnose and/or treat various heart conditions. Doctors may recommend this procedure for a number of different reasons. The most common reason is to evaluate chest pain. Chest pain can be a symptom of coronary artery disease (CAD), and cardiac catheterization can show whether plaque is narrowing or blocking your heart's arteries. This procedure is also used to evaluate the valves, as well as measure the blood flow and oxygen levels in different parts of your heart. For further information please visit [email or web address]. Please follow instruction sheet, as given.    Follow-Up:  Your physician recommends that you schedule a follow-up appointment in: TBD

## 2016-10-20 NOTE — Progress Notes (Signed)
10/20/2016 Johnny Kane   1941/04/24  [id number]  Primary Physician Leonides Sake, MD Primary Cardiologist: Dr. Ellyn Hack   Reason for Visit/CC: Chest Pain, CAD and abnormal NST   HPI:  Johnny Kane is a 75 y/o male, followed by Dr. Ellyn Hack, who presents to clinic today for reassessment of chest pain and to discuss recent abnormal stress test findings and indication for LHC.   He has a known h/o CAD. In January 2015, he underwent PCI to his mid RCA. Cath at that time showed no other obstructive CAD. EF was normal at 60-65%. He also has a h/o HTN, HLD, DM, palpitations/ PVCs and bradycardia. He is not on beta blockers due to issues with bradycardia.   He was recently seen in clinic by Cecilie Kicks, NP, for evaluation for chest pain with both typical and atypical features. His BP was noted to be elevated, thus his Benicar was increased to 40 mg for better BP control. Given his h/o CAD, he was ordered to undergo a NST to assess for ischemia. This was performed on 10/15/16 and interpreted as an intermediate risk study. There was noted to be normal perfusion, however EF was moderately decreased at 30-44%. These results were reported to Dr. Ellyn Hack for review. Given the patient's h/o CAD, recent symptoms and reduced EF on nuclear study, Dr. Ellyn Hack has recommended proceeding with definitive LHC.   Today in clinic, Johnny Kane reports he feels fine. No chest pain currently. He continues to have intermittent chest and left arm pain radiating down to his 4th and 5th digits of his upper extremity that is nitrate responsive. He admits that he has been under a great deal of stress recently. He is currently going thru a devoice. He is agreeable to cath. He has SL NTG at home that he uses PRN.     Current Meds  Medication Sig  . acetaminophen (TYLENOL) 325 MG tablet Take 2 tablets (650 mg total) by mouth every 4 (four) hours as needed for headache or mild pain.  . citalopram (CELEXA) 40 MG tablet Take 20  mg by mouth daily. Pt takes 10 mg  . clindamycin (CLEOCIN) 300 MG capsule Take 300 mg by mouth as needed (dental procedures).   . clopidogrel (PLAVIX) 75 MG tablet TAKE 1 TABLET BY MOUTH EVERY DAY  . famotidine (PEPCID) 40 MG tablet Take 40 mg by mouth 2 (two) times daily.   . furosemide (LASIX) 20 MG tablet TAKE 1 TABLET (20 MG TOTAL) BY MOUTH DAILY.  . modafinil (PROVIGIL) 100 MG tablet Take 200 mg by mouth daily.   . nitroGLYCERIN (NITROSTAT) 0.4 MG SL tablet Place 1 tablet (0.4 mg total) under the tongue every 5 (five) minutes as needed for chest pain.  Marland Kitchen olmesartan (BENICAR) 20 MG tablet Take 2 tablets (40 mg total) by mouth daily. (Patient taking differently: Take 20 mg by mouth daily. )  . rosuvastatin (CRESTOR) 5 MG tablet Take 5 mg by mouth daily at 6 PM.   . testosterone cypionate (DEPOTESTOTERONE CYPIONATE) 100 MG/ML injection Inject 100 mg into the muscle every 14 (fourteen) days. For IM use only   Allergies  Allergen Reactions  . Ciprofloxacin Hives and Itching  . Esomeprazole Magnesium     unknown  . Penicillins     unknown  . Soma [Carisoprodol]     unknown  . Sulfa Drugs Cross Reactors     unknown   Past Medical History:  Diagnosis Date  . Aortic calcification (HCC)  No evidence of AAA on CT Abd in 2013  . Aortic valve sclerosis    a. 12/2013 Echo: Aortic Sclerosis; EF 60-65%, Gr1 DD, mild AI, mildly dil LA.  Marland Kitchen CAD S/P percutaneous coronary angioplasty    a. 07/2011 Nl MV;  b. 11/2013 Cath: LM nl, LAD 20p, D1/2 nl, LCX 30p, OM1 40, OM2 nl, RCA 90-29m (3.5x18 Xience DES). c. Myoview 04/2014: No ischmia/Infarction, normal EF.  Marland Kitchen Depression   . Diabetes mellitus without complication ([place])   . Diverticulosis of colon   . GERD (gastroesophageal reflux disease)   . Hiatal hernia   . History of MRSA infection   . Hyperlipidemia   . Hypertension   . Low testosterone   . OA (osteoarthritis)   . Palpitations   . Palpitations   . Sleep apnea    a. mild not on cpap   .  Vasovagal episode   . Vitamin B 12 deficiency    Family History  Problem Relation Age of Onset  . Asthma Mother   . Hypertension Mother   . Heart attack Father    Past Surgical History:  Procedure Laterality Date  . CHOLECYSTECTOMY N/A 07/20/2013   Procedure: LAPAROSCOPIC CHOLECYSTECTOMY;  Surgeon: Joyice Faster. Cornett, MD;  Location: [facility];  Service: General;  Laterality: N/A;  . CORONARY ANGIOPLASTY  11/25/2013   DES   RCA    . LEFT HEART CATHETERIZATION WITH CORONARY ANGIOGRAM N/A 11/25/2013   Procedure: LEFT HEART CATHETERIZATION WITH CORONARY ANGIOGRAM;  Surgeon: Leonie Man, MD;  Location: [facility] CATH LAB;  Service: Cardiovascular;  Laterality: N/A;  . REPLACEMENT TOTAL KNEE     one on each knee  . TONSILLECTOMY    . TRANSTHORACIC ECHOCARDIOGRAM  February 2015   EF 60-65%, Gr1DD, mild AI with aortic sclerosis, mildly dilated LA.   Social History   Social History  . Marital status: Married    Spouse name: N/A  . Number of children: 2  . Years of education: N/A   Occupational History  . [employer]     retired   Social History Main Topics  . Smoking status: Former Smoker    Years: 11.00    Quit date: 11/04/1967  . Smokeless tobacco: Never Used  . Alcohol use No  . Drug use: No  . Sexual activity: Not on file   Other Topics Concern  . Not on file   Social History Narrative   Married father of 2. Retired from [employer].   Former smoker - quit in 1969 after 11 years; denies alcohol consumption     Review of Systems: General: negative for chills, fever, night sweats or weight changes.  Cardiovascular: negative for chest pain, dyspnea on exertion, edema, orthopnea, palpitations, paroxysmal nocturnal dyspnea or shortness of breath Dermatological: negative for rash Respiratory: negative for cough or wheezing Urologic: negative for hematuria Abdominal: negative for nausea, vomiting, diarrhea, bright red blood per rectum, melena, or hematemesis Neurologic: negative for visual  changes, syncope, or dizziness All other systems reviewed and are otherwise negative except as noted above.   Physical Exam:  Blood pressure (!) 150/80, pulse 70, height 5\' 11"  (1.803 m), weight 218 lb (98.9 kg).  General appearance: alert, cooperative and no distress Neck: no carotid bruit and no JVD Lungs: clear to auscultation bilaterally Heart: regular rate and rhythm, S1, S2 normal, no murmur, click, rub or gallop Extremities: extremities normal, atraumatic, no cyanosis or edema Pulses: 2+ and symmetric Skin: Skin color, texture, turgor normal. No rashes or lesions  Neurologic: Grossly normal  EKG not preformed   ASSESSMENT AND PLAN:   1. CAD/ Abnormal NST: h/o PCI to mid RCA in 2015 with recent symptoms concerning for new coronary ischemia with intermediate risk NST showing new reduction in LV systolic function. It has been recommended that we proceed with definitive LHC, given his history, symptoms and stress test findings. Pt agrees to procedure. We reviewed the indication as well as discussed procedural details and associated risk including risk of death, MI, CVA, vascular injury, risk of loss of limb, bleeding complications, renal injury and allergic reaction. He understands these risk and agrees to proceed. We will obtain pre-cath labs today. Pt instructed to hold is Lasix and Benicar day of procedure.   2. HTN: moderately elevated. Patient states he takes all of his meds at noon.   3. HLD: continue statin therapy with Crestor.   4. DM: followed by PCP. Not on Metformin.   PLAN  F/u after cath.   Lyda Jester PA-C 10/20/2016 11:14 AM

## 2016-10-21 ENCOUNTER — Telehealth: Payer: Self-pay | Admitting: *Deleted

## 2016-10-21 LAB — PROTIME-INR
INR: 1
PROTHROMBIN TIME: 10.8 s (ref 9.0–11.5)

## 2016-10-21 MED ORDER — OLMESARTAN MEDOXOMIL 40 MG PO TABS: 40.0000 mg | ORAL_TABLET | Freq: Every day | ORAL | 3 refills | Status: DC

## 2016-10-21 NOTE — Telephone Encounter (Signed)
Received prior authorization for patient's olmesartan 20 mg  (2 tablets daily) to make a total of 40 mg.  spoke to patient .   Patient states he has a bottle of 40 mg  ( quantity of 45 tablet for bottle supply) now-in which he was taking 1/2 tablet  ( 20 MG) daily prior to office appointment on 10/13/16 with [facility] NP. During that visit medication was increase to 40 mg again daily.  After today's clarification- will discontinue current prescription 40mg  take 1/2 tablet daily --- nd e-sent new  RX for 40 mg Olmesartan  Directions ---take 1 tablet daily . quantity of 90 with 2 refills- patient aware will need to call pharmacy when ready for new prescription to be filled.  SPOKE [place] BUT WILL COST POSSIBLE $135 - WILL WAIT AN SEE WHAT THE PRICE WILL BE AFTER THE FIRST OF THE YEAR

## 2016-10-23 ENCOUNTER — Ambulatory Visit (HOSPITAL_COMMUNITY)
Admission: RE | Admit: 2016-10-23 | Discharge: 2016-10-23 | Disposition: A | Discharge status: Home / Self Care | Payer: Medicare Other | Source: Ambulatory Visit | Attending: Cardiology | Admitting: Cardiology

## 2016-10-23 ENCOUNTER — Encounter (HOSPITAL_COMMUNITY)
Admission: RE | Disposition: A | Discharge status: Home / Self Care | Payer: Self-pay | Source: Ambulatory Visit | Attending: Cardiology

## 2016-10-23 ENCOUNTER — Encounter (HOSPITAL_COMMUNITY): Payer: Self-pay | Admitting: Cardiology

## 2016-10-23 DIAGNOSIS — R9439 Abnormal result of other cardiovascular function study: Secondary | ICD-10-CM | POA: Diagnosis present

## 2016-10-23 DIAGNOSIS — K449 Diaphragmatic hernia without obstruction or gangrene: Secondary | ICD-10-CM | POA: Insufficient documentation

## 2016-10-23 DIAGNOSIS — E785 Hyperlipidemia, unspecified: Secondary | ICD-10-CM | POA: Diagnosis not present

## 2016-10-23 DIAGNOSIS — R0609 Other forms of dyspnea: Secondary | ICD-10-CM | POA: Diagnosis present

## 2016-10-23 DIAGNOSIS — Z7902 Long term (current) use of antithrombotics/antiplatelets: Secondary | ICD-10-CM | POA: Diagnosis not present

## 2016-10-23 DIAGNOSIS — M1990 Unspecified osteoarthritis, unspecified site: Secondary | ICD-10-CM | POA: Diagnosis not present

## 2016-10-23 DIAGNOSIS — F329 Major depressive disorder, single episode, unspecified: Secondary | ICD-10-CM | POA: Diagnosis not present

## 2016-10-23 DIAGNOSIS — Z8249 Family history of ischemic heart disease and other diseases of the circulatory system: Secondary | ICD-10-CM | POA: Insufficient documentation

## 2016-10-23 DIAGNOSIS — Z8614 Personal history of Methicillin resistant Staphylococcus aureus infection: Secondary | ICD-10-CM | POA: Insufficient documentation

## 2016-10-23 DIAGNOSIS — I25110 Atherosclerotic heart disease of native coronary artery with unstable angina pectoris: Secondary | ICD-10-CM | POA: Insufficient documentation

## 2016-10-23 DIAGNOSIS — Z96653 Presence of artificial knee joint, bilateral: Secondary | ICD-10-CM | POA: Insufficient documentation

## 2016-10-23 DIAGNOSIS — K219 Gastro-esophageal reflux disease without esophagitis: Secondary | ICD-10-CM | POA: Insufficient documentation

## 2016-10-23 DIAGNOSIS — Z87891 Personal history of nicotine dependence: Secondary | ICD-10-CM | POA: Diagnosis not present

## 2016-10-23 DIAGNOSIS — Z880 Allergy status to penicillin: Secondary | ICD-10-CM | POA: Diagnosis not present

## 2016-10-23 DIAGNOSIS — I493 Ventricular premature depolarization: Secondary | ICD-10-CM | POA: Diagnosis present

## 2016-10-23 DIAGNOSIS — Z9861 Coronary angioplasty status: Secondary | ICD-10-CM

## 2016-10-23 DIAGNOSIS — I2510 Atherosclerotic heart disease of native coronary artery without angina pectoris: Secondary | ICD-10-CM | POA: Diagnosis not present

## 2016-10-23 DIAGNOSIS — I700 Atherosclerosis of aorta: Secondary | ICD-10-CM | POA: Diagnosis not present

## 2016-10-23 DIAGNOSIS — I10 Essential (primary) hypertension: Secondary | ICD-10-CM | POA: Diagnosis not present

## 2016-10-23 DIAGNOSIS — Z955 Presence of coronary angioplasty implant and graft: Secondary | ICD-10-CM | POA: Insufficient documentation

## 2016-10-23 DIAGNOSIS — Z882 Allergy status to sulfonamides status: Secondary | ICD-10-CM | POA: Insufficient documentation

## 2016-10-23 DIAGNOSIS — G4730 Sleep apnea, unspecified: Secondary | ICD-10-CM | POA: Insufficient documentation

## 2016-10-23 DIAGNOSIS — E119 Type 2 diabetes mellitus without complications: Secondary | ICD-10-CM | POA: Insufficient documentation

## 2016-10-23 DIAGNOSIS — I350 Nonrheumatic aortic (valve) stenosis: Secondary | ICD-10-CM | POA: Insufficient documentation

## 2016-10-23 DIAGNOSIS — E538 Deficiency of other specified B group vitamins: Secondary | ICD-10-CM | POA: Insufficient documentation

## 2016-10-23 HISTORY — PX: CARDIAC CATHETERIZATION: SHX172

## 2016-10-23 LAB — GLUCOSE, CAPILLARY: GLUCOSE-CAPILLARY: 112 mg/dL — AB (ref 65–99)

## 2016-10-23 SURGERY — LEFT HEART CATH AND CORONARY ANGIOGRAPHY
Anesthesia: LOCAL

## 2016-10-23 MED ORDER — SODIUM CHLORIDE 0.9 % IV SOLN: 250.0000 mL | INTRAVENOUS | Status: DC | PRN

## 2016-10-23 MED ORDER — ACETAMINOPHEN 325 MG PO TABS: 650.0000 mg | ORAL_TABLET | ORAL | Status: DC | PRN

## 2016-10-23 MED ORDER — CLOPIDOGREL BISULFATE 75 MG PO TABS
75.0000 mg | ORAL_TABLET | Freq: Once | ORAL | Status: AC
  Administered 2016-10-23: 75 mg via ORAL

## 2016-10-23 MED ORDER — FENTANYL CITRATE (PF) 100 MCG/2ML IJ SOLN
INTRAMUSCULAR | Status: AC
  Filled 2016-10-23: qty 2

## 2016-10-23 MED ORDER — SODIUM CHLORIDE 0.9% FLUSH: 3.0000 mL | INTRAVENOUS | Status: DC | PRN

## 2016-10-23 MED ORDER — VERAPAMIL HCL 2.5 MG/ML IV SOLN
INTRAVENOUS | Status: DC | PRN
  Administered 2016-10-23: 10 mL via INTRA_ARTERIAL

## 2016-10-23 MED ORDER — VERAPAMIL HCL 2.5 MG/ML IV SOLN
INTRAVENOUS | Status: AC
  Filled 2016-10-23: qty 2

## 2016-10-23 MED ORDER — HEPARIN (PORCINE) IN NACL 2-0.9 UNIT/ML-% IJ SOLN
INTRAMUSCULAR | Status: DC | PRN
  Administered 2016-10-23: 1000 mL

## 2016-10-23 MED ORDER — LIDOCAINE HCL (PF) 1 % IJ SOLN
INTRAMUSCULAR | Status: DC | PRN
  Administered 2016-10-23: 2 mL via INTRADERMAL

## 2016-10-23 MED ORDER — MIDAZOLAM HCL 2 MG/2ML IJ SOLN
INTRAMUSCULAR | Status: DC | PRN
  Administered 2016-10-23: 1 mg via INTRAVENOUS

## 2016-10-23 MED ORDER — HEPARIN SODIUM (PORCINE) 1000 UNIT/ML IJ SOLN
INTRAMUSCULAR | Status: DC | PRN
  Administered 2016-10-23: 4800 [IU] via INTRAVENOUS

## 2016-10-23 MED ORDER — SODIUM CHLORIDE 0.9 % IV SOLN
INTRAVENOUS | Status: DC
  Administered 2016-10-23: 07:00:00 via INTRAVENOUS

## 2016-10-23 MED ORDER — ONDANSETRON HCL 4 MG/2ML IJ SOLN: 4.0000 mg | Freq: Four times a day (QID) | INTRAMUSCULAR | Status: DC | PRN

## 2016-10-23 MED ORDER — SODIUM CHLORIDE 0.9 % WEIGHT BASED INFUSION: 1.0000 mL/kg/h | INTRAVENOUS | Status: DC

## 2016-10-23 MED ORDER — IOPAMIDOL (ISOVUE-370) INJECTION 76%
INTRAVENOUS | Status: AC
  Filled 2016-10-23: qty 50

## 2016-10-23 MED ORDER — FENTANYL CITRATE (PF) 100 MCG/2ML IJ SOLN
INTRAMUSCULAR | Status: DC | PRN
  Administered 2016-10-23: 25 ug via INTRAVENOUS

## 2016-10-23 MED ORDER — HEPARIN (PORCINE) IN NACL 2-0.9 UNIT/ML-% IJ SOLN
INTRAMUSCULAR | Status: AC
  Filled 2016-10-23: qty 1000

## 2016-10-23 MED ORDER — ASPIRIN 81 MG PO CHEW
81.0000 mg | CHEWABLE_TABLET | ORAL | Status: AC
  Administered 2016-10-23: 81 mg via ORAL

## 2016-10-23 MED ORDER — IOPAMIDOL (ISOVUE-370) INJECTION 76%
INTRAVENOUS | Status: AC
  Filled 2016-10-23: qty 100

## 2016-10-23 MED ORDER — ASPIRIN 81 MG PO CHEW
CHEWABLE_TABLET | ORAL | Status: AC
  Administered 2016-10-23: 81 mg via ORAL
  Filled 2016-10-23: qty 1

## 2016-10-23 MED ORDER — CLOPIDOGREL BISULFATE 75 MG PO TABS
ORAL_TABLET | ORAL | Status: AC
  Administered 2016-10-23: 75 mg via ORAL
  Filled 2016-10-23: qty 1

## 2016-10-23 MED ORDER — SODIUM CHLORIDE 0.9% FLUSH: 3.0000 mL | Freq: Two times a day (BID) | INTRAVENOUS | Status: DC

## 2016-10-23 MED ORDER — LIDOCAINE HCL (PF) 1 % IJ SOLN
INTRAMUSCULAR | Status: AC
  Filled 2016-10-23: qty 30

## 2016-10-23 MED ORDER — HEPARIN SODIUM (PORCINE) 1000 UNIT/ML IJ SOLN
INTRAMUSCULAR | Status: AC
  Filled 2016-10-23: qty 1

## 2016-10-23 MED ORDER — IOPAMIDOL (ISOVUE-370) INJECTION 76%
INTRAVENOUS | Status: DC | PRN
  Administered 2016-10-23: 110 mL via INTRA_ARTERIAL

## 2016-10-23 MED ORDER — MIDAZOLAM HCL 2 MG/2ML IJ SOLN
INTRAMUSCULAR | Status: AC
  Filled 2016-10-23: qty 2

## 2016-10-23 SURGICAL SUPPLY — 12 items
CATH INFINITI 5FR ANG PIGTAIL (CATHETERS) ×1 IMPLANT
CATH INFINITI JR4 5F (CATHETERS) ×1 IMPLANT
CATH VISTA GUIDE 6FR XBLAD3.5 (CATHETERS) ×1 IMPLANT
DEVICE RAD COMP TR BAND LRG (VASCULAR PRODUCTS) ×1 IMPLANT
GLIDESHEATH SLEND A-KIT 6F 22G (SHEATH) ×1 IMPLANT
GUIDEWIRE INQWIRE 1.5J.035X260 (WIRE) IMPLANT
INQWIRE 1.5J .035X260CM (WIRE) ×2
KIT HEART LEFT (KITS) ×2 IMPLANT
PACK CARDIAC CATHETERIZATION (CUSTOM PROCEDURE TRAY) ×2 IMPLANT
SYR MEDRAD MARK V 150ML (SYRINGE) ×2 IMPLANT
TRANSDUCER W/STOPCOCK (MISCELLANEOUS) ×2 IMPLANT
TUBING CIL FLEX 10 FLL-RA (TUBING) ×2 IMPLANT

## 2016-10-23 NOTE — Interval H&P Note (Signed)
History and Physical Interval Note:  10/23/2016 7:16 AM  [name]  has presented today for surgery, with the diagnosis of abn nuc study with DOE & Atypical "Angina" symptoms.    The various methods of treatment have been discussed with the patient and family. After consideration of risks, benefits and other options for treatment, the patient has consented to  Procedure(s): Left Heart Cath and Coronary Angiography (N/A) with possible Percutaneous Coronary Intervention as a surgical intervention .  The patient's history has been reviewed, patient examined, no change in status, stable for surgery.  I have reviewed the patient's chart and labs.  Questions were answered to the patient's satisfaction.    Cath Lab Visit (complete for each Cath Lab visit)  Clinical Evaluation Leading to the Procedure:   ACS: Yes.    Non-ACS:    Anginal Classification: CCS II  Anti-ischemic medical therapy: Minimal Therapy (1 class of medications)  Non-Invasive Test Results: Intermediate-risk stress test findings: cardiac mortality 1-3%/year  Prior CABG: No previous CABG   Glenetta Hew

## 2016-10-23 NOTE — H&P (View-Only) (Signed)
10/20/2016 Johnny Kane   1941-10-26  [id number]  Primary Physician Leonides Sake, MD Primary Cardiologist: Dr. Ellyn Hack   Reason for Visit/CC: Chest Pain, CAD and abnormal NST   HPI:  Johnny Kane is a 74 y/o male, followed by Dr. Ellyn Hack, who presents to clinic today for reassessment of chest pain and to discuss recent abnormal stress test findings and indication for LHC.   He has a known h/o CAD. In January 2015, he underwent PCI to his mid RCA. Cath at that time showed no other obstructive CAD. EF was normal at 60-65%. He also has a h/o HTN, HLD, DM, palpitations/ PVCs and bradycardia. He is not on beta blockers due to issues with bradycardia.   He was recently seen in clinic by Cecilie Kicks, NP, for evaluation for chest pain with both typical and atypical features. His BP was noted to be elevated, thus his Benicar was increased to 40 mg for better BP control. Given his h/o CAD, he was ordered to undergo a NST to assess for ischemia. This was performed on 10/15/16 and interpreted as an intermediate risk study. There was noted to be normal perfusion, however EF was moderately decreased at 30-44%. These results were reported to Dr. Ellyn Hack for review. Given the patient's h/o CAD, recent symptoms and reduced EF on nuclear study, Dr. Ellyn Hack has recommended proceeding with definitive LHC.   Today in clinic, Johnny Kane reports he feels fine. No chest pain currently. He continues to have intermittent chest and left arm pain radiating down to his 4th and 5th digits of his upper extremity that is nitrate responsive. He admits that he has been under a great deal of stress recently. He is currently going thru a devoice. He is agreeable to cath. He has SL NTG at home that he uses PRN.     Current Meds  Medication Sig  . acetaminophen (TYLENOL) 325 MG tablet Take 2 tablets (650 mg total) by mouth every 4 (four) hours as needed for headache or mild pain.  . citalopram (CELEXA) 40 MG tablet Take 20  mg by mouth daily. Pt takes 10 mg  . clindamycin (CLEOCIN) 300 MG capsule Take 300 mg by mouth as needed (dental procedures).   . clopidogrel (PLAVIX) 75 MG tablet TAKE 1 TABLET BY MOUTH EVERY DAY  . famotidine (PEPCID) 40 MG tablet Take 40 mg by mouth 2 (two) times daily.   . furosemide (LASIX) 20 MG tablet TAKE 1 TABLET (20 MG TOTAL) BY MOUTH DAILY.  . modafinil (PROVIGIL) 100 MG tablet Take 200 mg by mouth daily.   . nitroGLYCERIN (NITROSTAT) 0.4 MG SL tablet Place 1 tablet (0.4 mg total) under the tongue every 5 (five) minutes as needed for chest pain.  Marland Kitchen olmesartan (BENICAR) 20 MG tablet Take 2 tablets (40 mg total) by mouth daily. (Patient taking differently: Take 20 mg by mouth daily. )  . rosuvastatin (CRESTOR) 5 MG tablet Take 5 mg by mouth daily at 6 PM.   . testosterone cypionate (DEPOTESTOTERONE CYPIONATE) 100 MG/ML injection Inject 100 mg into the muscle every 14 (fourteen) days. For IM use only   Allergies  Allergen Reactions  . Ciprofloxacin Hives and Itching  . Esomeprazole Magnesium     unknown  . Penicillins     unknown  . Soma [Carisoprodol]     unknown  . Sulfa Drugs Cross Reactors     unknown   Past Medical History:  Diagnosis Date  . Aortic calcification (HCC)  No evidence of AAA on CT Abd in 2013  . Aortic valve sclerosis    a. 12/2013 Echo: Aortic Sclerosis; EF 60-65%, Gr1 DD, mild AI, mildly dil LA.  Marland Kitchen CAD S/P percutaneous coronary angioplasty    a. 07/2011 Nl MV;  b. 11/2013 Cath: LM nl, LAD 20p, D1/2 nl, LCX 30p, OM1 40, OM2 nl, RCA 90-17m (3.5x18 Xience DES). c. Myoview 04/2014: No ischmia/Infarction, normal EF.  Marland Kitchen Depression   . Diabetes mellitus without complication ([place])   . Diverticulosis of colon   . GERD (gastroesophageal reflux disease)   . Hiatal hernia   . History of MRSA infection   . Hyperlipidemia   . Hypertension   . Low testosterone   . OA (osteoarthritis)   . Palpitations   . Palpitations   . Sleep apnea    a. mild not on cpap   .  Vasovagal episode   . Vitamin B 12 deficiency    Family History  Problem Relation Age of Onset  . Asthma Mother   . Hypertension Mother   . Heart attack Father    Past Surgical History:  Procedure Laterality Date  . CHOLECYSTECTOMY N/A 07/20/2013   Procedure: LAPAROSCOPIC CHOLECYSTECTOMY;  Surgeon: Joyice Faster. Cornett, MD;  Location: [facility];  Service: General;  Laterality: N/A;  . CORONARY ANGIOPLASTY  11/25/2013   DES   RCA    . LEFT HEART CATHETERIZATION WITH CORONARY ANGIOGRAM N/A 11/25/2013   Procedure: LEFT HEART CATHETERIZATION WITH CORONARY ANGIOGRAM;  Surgeon: Leonie Man, MD;  Location: [facility] CATH LAB;  Service: Cardiovascular;  Laterality: N/A;  . REPLACEMENT TOTAL KNEE     one on each knee  . TONSILLECTOMY    . TRANSTHORACIC ECHOCARDIOGRAM  February 2015   EF 60-65%, Gr1DD, mild AI with aortic sclerosis, mildly dilated LA.   Social History   Social History  . Marital status: Married    Spouse name: N/A  . Number of children: 2  . Years of education: N/A   Occupational History  . [employer]     retired   Social History Main Topics  . Smoking status: Former Smoker    Years: 11.00    Quit date: 11/04/1967  . Smokeless tobacco: Never Used  . Alcohol use No  . Drug use: No  . Sexual activity: Not on file   Other Topics Concern  . Not on file   Social History Narrative   Married father of 2. Retired from [employer].   Former smoker - quit in 1969 after 11 years; denies alcohol consumption     Review of Systems: General: negative for chills, fever, night sweats or weight changes.  Cardiovascular: negative for chest pain, dyspnea on exertion, edema, orthopnea, palpitations, paroxysmal nocturnal dyspnea or shortness of breath Dermatological: negative for rash Respiratory: negative for cough or wheezing Urologic: negative for hematuria Abdominal: negative for nausea, vomiting, diarrhea, bright red blood per rectum, melena, or hematemesis Neurologic: negative for visual  changes, syncope, or dizziness All other systems reviewed and are otherwise negative except as noted above.   Physical Exam:  Blood pressure (!) 150/80, pulse 70, height 5\' 11"  (1.803 m), weight 218 lb (98.9 kg).  General appearance: alert, cooperative and no distress Neck: no carotid bruit and no JVD Lungs: clear to auscultation bilaterally Heart: regular rate and rhythm, S1, S2 normal, no murmur, click, rub or gallop Extremities: extremities normal, atraumatic, no cyanosis or edema Pulses: 2+ and symmetric Skin: Skin color, texture, turgor normal. No rashes or lesions  Neurologic: Grossly normal  EKG not preformed   ASSESSMENT AND PLAN:   1. CAD/ Abnormal NST: h/o PCI to mid RCA in 2015 with recent symptoms concerning for new coronary ischemia with intermediate risk NST showing new reduction in LV systolic function. It has been recommended that we proceed with definitive LHC, given his history, symptoms and stress test findings. Pt agrees to procedure. We reviewed the indication as well as discussed procedural details and associated risk including risk of death, MI, CVA, vascular injury, risk of loss of limb, bleeding complications, renal injury and allergic reaction. He understands these risk and agrees to proceed. We will obtain pre-cath labs today. Pt instructed to hold is Lasix and Benicar day of procedure.   2. HTN: moderately elevated. Patient states he takes all of his meds at noon.   3. HLD: continue statin therapy with Crestor.   4. DM: followed by PCP. Not on Metformin.   PLAN  F/u after cath.   Lyda Jester PA-C 10/20/2016 11:14 AM

## 2016-10-23 NOTE — Discharge Instructions (Signed)

## 2016-11-28 ENCOUNTER — Ambulatory Visit (INDEPENDENT_AMBULATORY_CARE_PROVIDER_SITE_OTHER): Payer: Medicare Other | Admitting: Cardiology

## 2016-11-28 ENCOUNTER — Encounter: Payer: Self-pay | Admitting: Cardiology

## 2016-11-28 VITALS — BP 139/84 | HR 71 | Ht 69.0 in | Wt 213.0 lb

## 2016-11-28 DIAGNOSIS — R9439 Abnormal result of other cardiovascular function study: Secondary | ICD-10-CM | POA: Diagnosis not present

## 2016-11-28 DIAGNOSIS — R079 Chest pain, unspecified: Secondary | ICD-10-CM | POA: Diagnosis not present

## 2016-11-28 DIAGNOSIS — I10 Essential (primary) hypertension: Secondary | ICD-10-CM

## 2016-11-28 DIAGNOSIS — I2510 Atherosclerotic heart disease of native coronary artery without angina pectoris: Secondary | ICD-10-CM

## 2016-11-28 DIAGNOSIS — Z9861 Coronary angioplasty status: Secondary | ICD-10-CM

## 2016-11-28 DIAGNOSIS — R0609 Other forms of dyspnea: Secondary | ICD-10-CM

## 2016-11-28 MED ORDER — ISOSORBIDE MONONITRATE ER 30 MG PO TB24: 30.0000 mg | ORAL_TABLET | Freq: Every day | ORAL | 3 refills | Status: DC

## 2016-11-28 NOTE — Progress Notes (Signed)
PCP: -- Seeing a new PA  Clinic Note: Chief Complaint  Patient presents with  . Follow-up    Post cath  . Shortness of Breath    randomly.  . Chest Pain    pulled muscle feeling    HPI: Johnny Kane is a 76 y.o. male with a PMH below who presents today for post Cath f/u - h/o CAD-PCI to RCA (patent by cath).  I last saw him in August 2017 other than in the Cath Lab.  Johnny Kane was last seen on December 11 below her [place], [place] to follow-up a previous visit complaints of chest pain with an abnormal stress test resulting from this visit.  Recent Hospitalizations: None besides cardiac catheterization  Studies Reviewed:    Myoview 10/15/2016: No ischemia or infarction noted, but EF estimated 44% with diffuse hypokinesis. As a result he was referred for cardiac catheterization.  Cardiac catheterization 10/23/2016: Widely patent RCA stent and otherwise minimal CAD. EF 50-55%. Normal LVEDP    Interval History: Johnny Kane presents here today for follow-up indicating that he is doing relatively well. No further chest pain episodes like he was having before. He still has some episodes that usually occur at rest. He alsohis hands and feet are often cold. These may be used nitroglycerin 4 times since his cath. He actually describes the chest pain reveals as being more like a muscle pull sensation. Since he is now living alone ostensibly him after his wife moved out, he is having a hard time figuring out the finances of this, but it is good hard way he is posting 2 sisters who had been down around the clock. They're living in his house and helping out with the housework and on jobs in lieu of rent. This has been good for him and away because they're helping out with his housework and maintaining the house that he was having a hard time with before. His health distress follow-up. He denies any real PND orthopnea or significant edema.  He doesn't really notice any chest tightness or pressure with  activity/exertion. He does not really notice any dyspnea either - just has random times when he feels a little bit dyspneic. No palpitations, lightheadedness, dizziness, weakness or syncope/near syncope. No TIA/amaurosis fugax symptoms. No melena, hematochezia, hematuria, or epstaxis. No claudication.  ROS: A comprehensive was performed. Review of Systems  Constitutional: Positive for malaise/fatigue ( better with provigil) and weight loss (intentional - cut back on PO).  HENT: Positive for congestion.   Eyes: Negative.   Respiratory: Negative for cough, shortness of breath and wheezing.   Cardiovascular: Negative for leg swelling (better with lasix).       Per HPI  Gastrointestinal: Negative for blood in stool and melena.  Genitourinary: Negative for hematuria.  Musculoskeletal: Positive for joint pain.  Neurological: Positive for dizziness (when CP episodes occus). Negative for focal weakness.  Endo/Heme/Allergies: Does not bruise/bleed easily.  Psychiatric/Behavioral: Positive for depression and memory loss. The patient is nervous/anxious and has insomnia.        Seasonal dysthymia Sx; dealing better with wife having left. Having a hard time getting paperwork intact. Separated wife - new dx of Br Ca.     Past Medical History:  Diagnosis Date  . Aortic calcification (HCC)    No evidence of AAA on CT Abd in 2013  . Aortic valve sclerosis    a. 12/2013 Echo: Aortic Sclerosis; EF 60-65%, Gr1 DD, mild AI, mildly dil LA.  Marland Kitchen CAD  S/P percutaneous coronary angioplasty    a. 07/2011 Nl MV;  b. 11/2013 Cath: LM nl, LAD 20p, D1/2 nl, LCX 30p, OM1 40, OM2 nl, RCA 90-39m (3.5x18 Xience DES). c. Myoview 04/2014: No ischmia/Infarction, normal EF.  Marland Kitchen Depression   . Diabetes mellitus without complication ([place])   . Diverticulosis of colon   . GERD (gastroesophageal reflux disease)   . Hiatal hernia   . History of MRSA infection   . Hyperlipidemia   . Hypertension   . Low testosterone   . OA  (osteoarthritis)   . Palpitations   . Palpitations   . Sleep apnea    a. mild not on cpap   . Vasovagal episode   . Vitamin B 12 deficiency     Past Surgical History:  Procedure Laterality Date  . CARDIAC CATHETERIZATION N/A 10/23/2016   Procedure: Left Heart Cath and Coronary Angiography;  Surgeon: Leonie Man, MD;  Location: [facility] CV LAB;  Service: Cardiovascular;  Laterality: N/A;  . CHOLECYSTECTOMY N/A 07/20/2013   Procedure: LAPAROSCOPIC CHOLECYSTECTOMY;  Surgeon: Joyice Faster. Cornett, MD;  Location: [facility];  Service: General;  Laterality: N/A;  . CORONARY ANGIOPLASTY  11/25/2013   DES   RCA    . LEFT HEART CATHETERIZATION WITH CORONARY ANGIOGRAM N/A 11/25/2013   Procedure: LEFT HEART CATHETERIZATION WITH CORONARY ANGIOGRAM;  Surgeon: Leonie Man, MD;  Location: [facility] CATH LAB;  Service: Cardiovascular;  Laterality: N/A;  . REPLACEMENT TOTAL KNEE     one on each knee  . TONSILLECTOMY    . TRANSTHORACIC ECHOCARDIOGRAM  February 2015   EF 60-65%, Gr1DD, mild AI with aortic sclerosis, mildly dilated LA.    Current Meds  Medication Sig  . acetaminophen (TYLENOL) 325 MG tablet Take 2 tablets (650 mg total) by mouth every 4 (four) hours as needed for headache or mild pain.  . citalopram (CELEXA) 40 MG tablet Take 20 mg by mouth daily.   . clindamycin (CLEOCIN) 300 MG capsule Take 300 mg by mouth as needed (dental procedures).   . clopidogrel (PLAVIX) 75 MG tablet TAKE 1 TABLET BY MOUTH EVERY DAY  . Cyanocobalamin (VITAMIN B-12 PO) Take 1 tablet by mouth daily.  . famotidine (PEPCID) 40 MG tablet Take 40 mg by mouth 2 (two) times daily.   . ferrous sulfate 325 (65 FE) MG EC tablet Take 325 mg by mouth daily with breakfast.  . furosemide (LASIX) 20 MG tablet TAKE 1 TABLET (20 MG TOTAL) BY MOUTH DAILY.  . modafinil (PROVIGIL) 100 MG tablet Take 100 mg by mouth 2 (two) times daily.   . nitroGLYCERIN (NITROSTAT) 0.4 MG SL tablet Place 1 tablet (0.4 mg total) under the tongue  every 5 (five) minutes as needed for chest pain.  Marland Kitchen olmesartan (BENICAR) 40 MG tablet Take 1 tablet (40 mg total) by mouth daily.  . rosuvastatin (CRESTOR) 5 MG tablet Take 5 mg by mouth daily at 6 PM.   . testosterone cypionate (DEPOTESTOTERONE CYPIONATE) 100 MG/ML injection Inject 100 mg into the muscle every 14 (fourteen) days. For IM use only    Allergies  Allergen Reactions  . Ciprofloxacin Hives and Itching  . Esomeprazole Magnesium     unknown  . Penicillins Swelling    Hands and facial swelling   Has patient had a PCN reaction causing immediate rash, facial/tongue/throat swelling, SOB or lightheadedness with hypotension: no Has patient had a PCN reaction causing severe rash involving mucus membranes or skin necrosis: {no Has patient had a PCN  reaction that required hospitalization {no Has patient had a PCN reaction occurring within the last 10 years: {no If all of the above answers are "NO", then may proceed with Cephalosporin use.  Manuela Neptune [Carisoprodol]     unknown  . Sulfa Drugs Cross Reactors     unknown    Social History   Social History  . Marital status: Married    Spouse name: N/A  . Number of children: 2  . Years of education: N/A   Occupational History  . [employer]     retired   Social History Main Topics  . Smoking status: Former Smoker    Years: 11.00    Quit date: 11/04/1967  . Smokeless tobacco: Never Used  . Alcohol use No  . Drug use: No  . Sexual activity: Not Asked   Other Topics Concern  . None   Social History Narrative   Married father of 2. Retired from [employer].   Former smoker - quit in 1969 after 11 years; denies alcohol consumption    family history includes Asthma in his mother; Heart attack in his father; Hypertension in his mother.  Wt Readings from Last 3 Encounters:  11/28/16 96.6 kg (213 lb)  10/23/16 94.3 kg (208 lb)  10/20/16 98.9 kg (218 lb)    PHYSICAL EXAM BP 139/84   Pulse 71   Ht 5\' 9"  (1.753 m)   Wt 96.6 kg (213 lb)    BMI 31.45 kg/m  General appearance: alert, cooperative, appears stated age, no distress and mildly obese; normal mood and affect.  Neck: no adenopathy, no JVD ; possible right carotid bruit. Would need to assess again prior to considering Dopplers. Lungs: clear to auscultation bilaterally, normal percussion bilaterally and non-labored  Heart: regular rate and rhythm with intermittent ectopy, S1, S2 normal, no murmur, click, rub or gallop; nondisplaced PMI  Abdomen: soft, non-tender; bowel sounds normal; no masses, no organomegaly;  Extremities: extremities normal, atraumatic, no cyanosis or edema  Pulses: 2+ and symmetric;  Neurologic: Mental status: Alert, oriented, thought content appropriate; moves all 4 extremities.  Cranial nerves: normal (II-XII grossly intact)    Adult ECG Report n/a  Other studies Reviewed: Additional studies/ records that were reviewed today include:  Recent Labs:  No results found for: CHOL, HDL, LDLCALC, LDLDIRECT, TRIG, CHOLHDL Lab Results  Component Value Date   CREATININE 1.33 (H) 10/20/2016   BUN 23 10/20/2016   NA 139 10/20/2016   K 5.0 10/20/2016   CL 101 10/20/2016   CO2 29 10/20/2016   ASSESSMENT / PLAN: Problem List Items Addressed This Visit    DOE (dyspnea on exertion) -  (Chronic)    Doing relatively well no exacerbations. Normal EDP on cath, and the EF by LV gram was more in the normal range, suggesting that the Myoview EF is probably not accurate.      CAD S/P percutaneous coronary angioplasty - RCA DES 11/25/13 - Primary (Chronic)    Widely patent stent on cath. Remains on Plavix without aspirin along with ARB. Also limited extent and new start nitrate. Not on beta blocker because of fatigue and bradycardia issues.      Relevant Medications   isosorbide mononitrate (IMDUR) 30 MG 24 hr tablet   Essential hypertension (Chronic)    Well-controlled.      Relevant Medications   isosorbide mononitrate (IMDUR) 30 MG 24 hr  tablet   Chest pain with low risk for cardiac etiology (Chronic)    Chest pain symptoms don't  really sound very much like angina and his calf clearly showed widely patent RCA stent. Since he is taking nitroglycerin and some and has had some relief with it, I wouldn't put him on long-acting nitrate. There could be some vasospasm component since he has the sensation of hands and feet being cold.      Abnormal nuclear stress test - due to inaccurate assessment of EF    Called abnormal because of reduced EF which was probably inaccurate read. Widely patent stent and minimal disease elsewhere.         Current medicines are reviewed at length with the patient today. (+/- concerns) n/a The following changes have been made: see below  Patient Instructions  MEDICATION ADDITION  START ISOSORBIDE MONONITRATE 30 MG  TAKE ONE TABLET BY MOUTH AT BEDTIME-    NO OTHER CHANGES   Your physician wants you to follow-up in [place].You will receive a reminder letter in the mail two months in advance. If you don't receive a letter, please call our office to schedule the follow-up appointment.  If you need a refill on your cardiac medications before your next appointment, please call your pharmacy.    Studies Ordered:   No orders of the defined types were placed in this encounter.     Glenetta Hew, M.D., M.S. Interventional Cardiologist   Pager # [phone] Phone # [phone] [address] [place], [place] [address]

## 2016-11-28 NOTE — Patient Instructions (Addendum)
MEDICATION ADDITION  START ISOSORBIDE MONONITRATE 30 MG  TAKE ONE TABLET BY MOUTH AT BEDTIME-    NO OTHER CHANGES   Your physician wants you to follow-up in [name].You will receive a reminder letter in the mail two months in advance. If you don't receive a letter, please call our office to schedule the follow-up appointment.  If you need a refill on your cardiac medications before your next appointment, please call your pharmacy.

## 2016-11-30 ENCOUNTER — Encounter: Payer: Self-pay | Admitting: Cardiology

## 2016-11-30 NOTE — Assessment & Plan Note (Signed)
Well controlled 

## 2016-11-30 NOTE — Assessment & Plan Note (Signed)
Doing relatively well no exacerbations. Normal EDP on cath, and the EF by LV gram was more in the normal range, suggesting that the Myoview EF is probably not accurate.

## 2016-11-30 NOTE — Assessment & Plan Note (Signed)
Chest pain symptoms don't really sound very much like angina and his calf clearly showed widely patent RCA stent. Since he is taking nitroglycerin and some and has had some relief with it, I wouldn't put him on long-acting nitrate. There could be some vasospasm component since he has the sensation of hands and feet being cold.

## 2016-11-30 NOTE — Assessment & Plan Note (Signed)
Called abnormal because of reduced EF which was probably inaccurate read. Widely patent stent and minimal disease elsewhere.

## 2016-11-30 NOTE — Assessment & Plan Note (Signed)
Widely patent stent on cath. Remains on Plavix without aspirin along with ARB. Also limited extent and new start nitrate. Not on beta blocker because of fatigue and bradycardia issues.

## 2016-12-12 ENCOUNTER — Other Ambulatory Visit: Payer: Self-pay | Admitting: Cardiology

## 2017-01-04 ENCOUNTER — Emergency Department (HOSPITAL_COMMUNITY): Payer: Medicare Other

## 2017-01-04 ENCOUNTER — Encounter (HOSPITAL_COMMUNITY): Payer: Self-pay | Admitting: Emergency Medicine

## 2017-01-04 ENCOUNTER — Emergency Department (HOSPITAL_COMMUNITY)
Admission: EM | Admit: 2017-01-04 | Discharge: 2017-01-04 | Disposition: A | Discharge status: Home / Self Care | Payer: Medicare Other | Attending: Emergency Medicine | Admitting: Emergency Medicine

## 2017-01-04 DIAGNOSIS — I10 Essential (primary) hypertension: Secondary | ICD-10-CM | POA: Insufficient documentation

## 2017-01-04 DIAGNOSIS — R079 Chest pain, unspecified: Secondary | ICD-10-CM | POA: Insufficient documentation

## 2017-01-04 DIAGNOSIS — I2510 Atherosclerotic heart disease of native coronary artery without angina pectoris: Secondary | ICD-10-CM | POA: Diagnosis not present

## 2017-01-04 DIAGNOSIS — Z87891 Personal history of nicotine dependence: Secondary | ICD-10-CM | POA: Insufficient documentation

## 2017-01-04 DIAGNOSIS — E119 Type 2 diabetes mellitus without complications: Secondary | ICD-10-CM | POA: Insufficient documentation

## 2017-01-04 DIAGNOSIS — Z79899 Other long term (current) drug therapy: Secondary | ICD-10-CM | POA: Diagnosis not present

## 2017-01-04 LAB — BASIC METABOLIC PANEL
Anion gap: 6 (ref 5–15)
BUN: 21 mg/dL — AB (ref 6–20)
CALCIUM: 9.7 mg/dL (ref 8.9–10.3)
CHLORIDE: 103 mmol/L (ref 101–111)
CO2: 28 mmol/L (ref 22–32)
CREATININE: 1.23 mg/dL (ref 0.61–1.24)
GFR calc Af Amer: >60 mL/min (ref >60)
GFR, EST NON AFRICAN AMERICAN: 55 mL/min — AB (ref >60)
Glucose, Bld: 94 mg/dL (ref 65–99)
Potassium: 4.5 mmol/L (ref 3.5–5.1)
SODIUM: 137 mmol/L (ref 135–145)

## 2017-01-04 LAB — CBC WITH DIFFERENTIAL/PLATELET
BASOS PCT: 0 %
Basophils Absolute: 0 10*3/uL (ref 0.0–0.1)
EOS PCT: 3 %
Eosinophils Absolute: 0.2 10*3/uL (ref 0.0–0.7)
HEMATOCRIT: 48.1 % (ref 39.0–52.0)
Hemoglobin: 16.8 g/dL (ref 13.0–17.0)
Lymphocytes Relative: 17 %
Lymphs Abs: 1.1 10*3/uL (ref 0.7–4.0)
MCH: 30.9 pg (ref 26.0–34.0)
MCHC: 34.9 g/dL (ref 30.0–36.0)
MCV: 88.6 fL (ref 78.0–100.0)
MONO ABS: 0.6 10*3/uL (ref 0.1–1.0)
MONOS PCT: 10 %
NEUTROS ABS: 4.6 10*3/uL (ref 1.7–7.7)
Neutrophils Relative %: 70 %
Platelets: 127 10*3/uL — ABNORMAL LOW (ref 150–400)
RBC: 5.43 MIL/uL (ref 4.22–5.81)
RDW: 13.7 % (ref 11.5–15.5)
WBC: 6.5 10*3/uL (ref 4.0–10.5)

## 2017-01-04 LAB — I-STAT TROPONIN, ED
TROPONIN I, POC: 0 ng/mL (ref 0.00–0.08)
Troponin i, poc: 0 ng/mL (ref 0.00–0.08)

## 2017-01-04 NOTE — ED Triage Notes (Signed)
Per EMS- pt here from home with c.o.  Chest pain starting last night. This morning pt took 2 nitro tablets this morning with full relief. Pain was a 3/10 and is now pain free. Given 324 Asprin by EMS. 18G PIV placed to [place] by EMS. Pt has been under recent stress. Hx of stents.

## 2017-01-04 NOTE — ED Provider Notes (Signed)
[facility] DEPT Provider Note  CSN: [id number] Arrival date & time: 01/04/17  1337  History   Chief Complaint Chief Complaint  Patient presents with  . Chest Pain    HPI Johnny Kane is a 76 y.o. male.  HPI Pt started having chest pain last night.  It was mild, dull and on the right side.  He had some shortness of breath.   No nausea. It was not that bad so he went to bed last night.  In the morning it was still there.  He tried taking a couple of nitro with some mild improvement.  While in church this am he felt like the pain was getting worse.  He called EMS.  He was given ASA and after that the symptoms resolved.    Pt does have CAD and history of stents in the past (2015).  Pt also had a cath in dec 2017 Past Medical History:  Diagnosis Date  . Aortic calcification (HCC)    No evidence of AAA on CT Abd in 2013  . Aortic valve sclerosis    a. 12/2013 Echo: Aortic Sclerosis; EF 60-65%, Gr1 DD, mild AI, mildly dil LA.  Marland Kitchen CAD S/P percutaneous coronary angioplasty    a. 07/2011 Nl MV;  b. 11/2013 Cath: LM nl, LAD 20p, D1/2 nl, LCX 30p, OM1 40, OM2 nl, RCA 90-75m (3.5x18 Xience DES). c. Myoview 04/2014: No ischmia/Infarction, normal EF.  Marland Kitchen Depression   . Diabetes mellitus without complication ([place])   . Diverticulosis of colon   . GERD (gastroesophageal reflux disease)   . Hiatal hernia   . History of MRSA infection   . Hyperlipidemia   . Hypertension   . Low testosterone   . OA (osteoarthritis)   . Palpitations   . Palpitations   . Sleep apnea    a. mild not on cpap   . Vasovagal episode   . Vitamin B 12 deficiency     Patient Active Problem List   Diagnosis Date Noted  . Abnormal nuclear stress test - due to inaccurate assessment of EF 10/23/2016  . Near syncope 12/26/2015  . Symptomatic PVCs 12/07/2014  . Memory loss or impairment 11/16/2014  . Chest pain with low risk for cardiac etiology 04/04/2014  . Obesity (BMI 30-39.9) 04/04/2014  . Fatigue 04/04/2014  .  Weakness 03/07/2014  . CAD S/P percutaneous coronary angioplasty - RCA DES 11/25/13 11/26/2013  . AAA (abdominal aortic aneurysm) ([place])   . Essential hypertension   . Hyperlipidemia with target LDL less than 70   . DOE (dyspnea on exertion) -      Past Surgical History:  Procedure Laterality Date  . CARDIAC CATHETERIZATION N/A 10/23/2016   Procedure: Left Heart Cath and Coronary Angiography;  Surgeon: Leonie Man, MD;  Location: [facility];  Service: Cardiovascular;  Laterality: N/A;  . CHOLECYSTECTOMY N/A 07/20/2013   Procedure: LAPAROSCOPIC CHOLECYSTECTOMY;  Surgeon: Joyice Faster. Cornett, MD;  Location: [facility];  Service: General;  Laterality: N/A;  . CORONARY ANGIOPLASTY  11/25/2013   DES   RCA    . LEFT HEART CATHETERIZATION WITH CORONARY ANGIOGRAM N/A 11/25/2013   Procedure: LEFT HEART CATHETERIZATION WITH CORONARY ANGIOGRAM;  Surgeon: Leonie Man, MD;  Location: [facility] CATH LAB;  Service: Cardiovascular;  Laterality: N/A;  . REPLACEMENT TOTAL KNEE     one on each knee  . TONSILLECTOMY    . TRANSTHORACIC ECHOCARDIOGRAM  February 2015   EF 60-65%, Gr1DD, mild AI with aortic sclerosis, mildly  dilated LA.       Home Medications    Prior to Admission medications   Medication Sig Start Date End Date Taking? Authorizing Provider  acetaminophen (TYLENOL) 325 MG tablet Take 2 tablets (650 mg total) by mouth every 4 (four) hours as needed for headache or mild pain. 11/26/13   Erlene Quan, PA-C  citalopram (CELEXA) 40 MG tablet Take 20 mg by mouth daily.     Historical Provider, MD  clindamycin (CLEOCIN) 300 MG capsule Take 300 mg by mouth as needed (dental procedures).  07/28/13   Historical Provider, MD  clopidogrel (PLAVIX) 75 MG tablet TAKE 1 TABLET BY MOUTH EVERY DAY 12/12/16   Leonie Man, MD  Cyanocobalamin (VITAMIN B-12 PO) Take 1 tablet by mouth daily.    Historical Provider, MD  famotidine (PEPCID) 40 MG tablet Take 40 mg by mouth 2 (two) times daily.     Historical  Provider, MD  ferrous sulfate 325 (65 FE) MG EC tablet Take 325 mg by mouth daily with breakfast.    Historical Provider, MD  furosemide (LASIX) 20 MG tablet TAKE 1 TABLET (20 MG TOTAL) BY MOUTH DAILY. 08/25/16   Leonie Man, MD  isosorbide mononitrate (IMDUR) 30 MG 24 hr tablet Take 1 tablet (30 mg total) by mouth daily. 11/28/16 02/26/17  Leonie Man, MD  modafinil (PROVIGIL) 100 MG tablet Take 100 mg by mouth 2 (two) times daily.     Historical Provider, MD  nitroGLYCERIN (NITROSTAT) 0.4 MG SL tablet Place 1 tablet (0.4 mg total) under the tongue every 5 (five) minutes as needed for chest pain. 06/18/16   Leonie Man, MD  olmesartan (BENICAR) 40 MG tablet Take 1 tablet (40 mg total) by mouth daily. 10/21/16   Isaiah Serge, NP  rosuvastatin (CRESTOR) 5 MG tablet Take 5 mg by mouth daily at 6 PM.  04/09/16   Historical Provider, MD  testosterone cypionate (DEPOTESTOTERONE CYPIONATE) 100 MG/ML injection Inject 100 mg into the muscle every 14 (fourteen) days. For IM use only    Historical Provider, MD    Family History Family History  Problem Relation Age of Onset  . Asthma Mother   . Hypertension Mother   . Heart attack Father     Social History Social History  Substance Use Topics  . Smoking status: Former Smoker    Years: 11.00    Quit date: 11/04/1967  . Smokeless tobacco: Never Used  . Alcohol use No     Allergies   Ciprofloxacin; Esomeprazole magnesium; Penicillins; Soma [carisoprodol]; and Sulfa drugs cross reactors   Review of Systems Review of Systems  All other systems reviewed and are negative.    Physical Exam Updated Vital Signs BP 162/89   Pulse 73   Temp 98.2 F (36.8 C) (Oral)   Resp 10   Ht 5\' 11"  (1.803 m)   Wt 92.1 kg   SpO2 96%   BMI 28.31 kg/m   Physical Exam  Constitutional: He appears well-developed and well-nourished. No distress.  HENT:  Head: Normocephalic and atraumatic.  Right Ear: External ear normal.  Left Ear: External ear  normal.  Eyes: Conjunctivae are normal. Right eye exhibits no discharge. Left eye exhibits no discharge. No scleral icterus.  Neck: Neck supple. No tracheal deviation present.  Cardiovascular: Normal rate, regular rhythm and intact distal pulses.   Pulmonary/Chest: Effort normal and breath sounds normal. No stridor. No respiratory distress. He has no wheezes. He has no rales.  Abdominal: Soft.  Bowel sounds are normal. He exhibits no distension. There is no tenderness. There is no rebound and no guarding.  Musculoskeletal: He exhibits no edema or tenderness.  Neurological: He is alert. He has normal strength. No cranial nerve deficit (no facial droop, extraocular movements intact, no slurred speech) or sensory deficit. He exhibits normal muscle tone. He displays no seizure activity. Coordination normal.  Skin: Skin is warm and dry. No rash noted.  Psychiatric: He has a normal mood and affect.  Nursing note and vitals reviewed.    ED Treatments / Results  Labs (all labs ordered are listed, but only abnormal results are displayed) Labs Reviewed  BASIC METABOLIC PANEL - Abnormal; Notable for the following:       Result Value   BUN 21 (*)    GFR calc non Af Amer 55 (*)    All other components within normal limits  CBC WITH DIFFERENTIAL/PLATELET - Abnormal; Notable for the following:    Platelets 127 (*)    All other components within normal limits  I-STAT TROPOININ, ED  I-STAT TROPOININ, ED    EKG  EKG Interpretation  Date/Time:  Sunday January 04 2017 13:40:16 EST Ventricular Rate:  72 PR Interval:    QRS Duration: 140 QT Interval:  394 QTC Calculation: 432 R Axis:   48 Text Interpretation:  Sinus rhythm Right bundle branch block RBBB is new since last tracing in 2015 Confirmed by Hooria Gasparini  MD-J, Loletha Bertini [id number]) on 01/04/2017 1:45:00 PM       Radiology Dg Chest 2 View  Result Date: 01/04/2017 CLINICAL DATA:  Pt states severe rt chest /breast pains since last night, did get better with  Asprin and nitro, but came in to get checked out, hx cardiac stent, htn, diabetes, non smoker EXAM: CHEST  2 VIEW COMPARISON:  11/22/2013 FINDINGS: Shallow lung inflation. Heart size is normal. No focal consolidations or pleural effusions. Patient is kyphotic. Surgical clips are present in the upper abdomen. IMPRESSION: 1. Shallow inflation. 2.  No evidence for acute cardiopulmonary abnormality. Electronically Signed   By: Nolon Nations M.D.   On: 01/04/2017 14:45    Procedures Procedures (including critical care time)  Medications Ordered in ED Medications - No data to display   Initial Impression / Assessment and Plan / ED Course  I have reviewed the triage vital signs and the nursing notes.  Pertinent labs & imaging results that were available during my care of the patient were reviewed by me and considered in my medical decision making (see chart for details).  Clinical Course as of Jan 05 1711  Sun Jan 04, 2017  1513 Initial troponin normal.  Plan on second troponin.  [JK]    Clinical Course User Index [JK] Dorie Rank, MD   Patient presents to emergency with complaints of chest pain that started last evening. At the time the patient arrived in the emergency room his symptoms have all resolved. He had a recent cardiac cath in December 2017. That showed widely patent RCA stent and otherwise minimal coronary artery disease.  His initial troponin is normal. He is asymptomatic right now. Plan on doing a second cardiac enzyme and if that is normal I think he can safely follow up to see his cardiologist on an outpatient basis.  Final Clinical Impressions(s) / ED Diagnoses   Final diagnoses:  Chest pain, unspecified type    New Prescriptions New Prescriptions   No medications on file     Dorie Rank, MD 01/04/17 1712

## 2017-01-04 NOTE — ED Provider Notes (Signed)
Blood pressure 162/89, pulse 73, temperature 98.2 F (36.8 C), temperature source Oral, resp. rate 10, height 5\' 11"  (1.803 m), weight 203 lb (92.1 kg), SpO2 96 %.  Assuming care from Dr. Tomi Bamberger.  In short, Johnny Kane is a 76 y.o. male with a chief complaint of Chest Pain .  Refer to the original H&P for additional details.  The current plan of care is to follow troponin and reassess.   05:45 PM Repeat troponin is negative. The patient is symptom-free at this time. He plans to call his private care physician and cardiologist tomorrow to discuss his ED visit and set up a follow-up appointment. Rest his case and return precautions in detail.  At this time, I do not feel there is any life-threatening condition present. I have reviewed and discussed all results (EKG, imaging, lab, urine as appropriate), exam findings with patient. I have reviewed nursing notes and appropriate previous records.  I feel the patient is safe to be discharged home without further emergent workup. Discussed usual and customary return precautions. Patient and family (if present) verbalize understanding and are comfortable with this plan.  Patient will follow-up with their primary care provider. If they do not have a primary care provider, information for follow-up has been provided to them. All questions have been answered.  Nanda Quinton, MD    Margette Fast, MD 01/04/17 [phone]

## 2017-01-04 NOTE — Discharge Instructions (Signed)

## 2017-01-04 NOTE — ED Notes (Signed)
Patient Alert and oriented X4. Stable and ambulatory. Patient verbalized understanding of the discharge instructions.  Patient belongings were taken by the patient.  

## 2017-01-07 ENCOUNTER — Telehealth: Payer: Self-pay | Admitting: Cardiology

## 2017-01-07 DIAGNOSIS — E349 Endocrine disorder, unspecified: Secondary | ICD-10-CM | POA: Diagnosis not present

## 2017-01-07 DIAGNOSIS — E782 Mixed hyperlipidemia: Secondary | ICD-10-CM | POA: Diagnosis not present

## 2017-01-07 DIAGNOSIS — R7303 Prediabetes: Secondary | ICD-10-CM | POA: Diagnosis not present

## 2017-01-07 DIAGNOSIS — N183 Chronic kidney disease, stage 3 (moderate): Secondary | ICD-10-CM | POA: Diagnosis not present

## 2017-01-07 DIAGNOSIS — I10 Essential (primary) hypertension: Secondary | ICD-10-CM | POA: Diagnosis not present

## 2017-01-07 NOTE — Telephone Encounter (Signed)
New message    Pt is calling because he was in the hospital on Sunday for chest pain. He wasn't sure if he should schedule an appt to follow up with Dr. Ellyn Hack. Appt was scheduled for 3/9 at 10am with [place].

## 2017-01-07 NOTE — Telephone Encounter (Signed)
lmtcb

## 2017-01-08 DIAGNOSIS — E349 Endocrine disorder, unspecified: Secondary | ICD-10-CM | POA: Diagnosis not present

## 2017-01-09 ENCOUNTER — Encounter: Payer: Self-pay | Admitting: Student

## 2017-01-09 ENCOUNTER — Ambulatory Visit (INDEPENDENT_AMBULATORY_CARE_PROVIDER_SITE_OTHER): Payer: Medicare Other | Admitting: Student

## 2017-01-09 VITALS — BP 109/66 | HR 73 | Ht 71.0 in | Wt 207.8 lb

## 2017-01-09 DIAGNOSIS — R079 Chest pain, unspecified: Secondary | ICD-10-CM | POA: Diagnosis not present

## 2017-01-09 DIAGNOSIS — I10 Essential (primary) hypertension: Secondary | ICD-10-CM

## 2017-01-09 DIAGNOSIS — I2510 Atherosclerotic heart disease of native coronary artery without angina pectoris: Secondary | ICD-10-CM | POA: Diagnosis not present

## 2017-01-09 DIAGNOSIS — E785 Hyperlipidemia, unspecified: Secondary | ICD-10-CM | POA: Diagnosis not present

## 2017-01-09 DIAGNOSIS — Z9861 Coronary angioplasty status: Secondary | ICD-10-CM | POA: Diagnosis not present

## 2017-01-09 NOTE — Progress Notes (Signed)
Cardiology Office Note    Date:  01/09/2017   ID:  DRAYDEN LUKAS, DOB [birth year], MRN [id number]  PCP:  Helen Hashimoto., MD  Cardiologist: Dr. Ellyn Hack   Chief Complaint  Patient presents with  . Follow-up    recent ER visit    History of Present Illness:    Johnny Kane is a 76 y.o. male with past medical history of CAD (s/p DES to RCA in 2015, repeat cath in 10/2016 showing widely patent RCA stent with minimal CAD), HTN, HLD and OA who presents to the office today for follow-up from a recent Emergency Department visit.   Was last seen by Dr. Ellyn Hack on 11/28/2016 and reported doing well from a cardiac perspective, denying any repeat episodes of chest pain. He was started on Imdur 30mg  daily at the time of his visit.   Seen recently at [facility] ED on 01/04/2017 for recurrent chest pain. Described it as being a dull pain along his right side which improved following administration of 2 SL NTG. Labs showed stable electrolytes and kidney function. Initial and delta troponin were negative. CXR with no acute abnormalities. EKG showed NSR, HR 72, with known RBBB. He was discharged from the ED and informed to follow-up with Cardiology as an outpatient.   In talking with the patient today, he denies any repeat episodes of chest discomfort since his recent ER visit. The discomfort had initially started that morning and was relieved with 1 SL NTG. Hours later, the pain returned and he had to take an additional SL NTG. This improved his pain, but he was concerned about these new symptoms and called 911. He denies any associated dyspnea, nausea, vomiting, or diaphoresis. Says the pain did not resemble what he experienced when he required stent placement in 2015.  He reports being under increased stress for the past several months due to undergoing a divorce from his previous wife for 37 years. He has recently entered into a new dating relationship and says this brings lots of new stressors as  well.   He has also been experiencing increased home stressors due to allowing two people to live with him who were undergoing a time of financial difficulty. Says they were suppose to move-out on 3/1 but continued to live there.     Past Medical History:  Diagnosis Date  . Aortic calcification (HCC)    No evidence of AAA on CT Abd in 2013  . Aortic valve sclerosis    a. 12/2013 Echo: Aortic Sclerosis; EF 60-65%, Gr1 DD, mild AI, mildly dil LA.  Marland Kitchen CAD S/P percutaneous coronary angioplasty    a. 07/2011 Nl MV;  b. 11/2013 Cath: LM nl, LAD 20p, D1/2 nl, LCX 30p, OM1 40, OM2 nl, RCA 90-75m (3.5x18 Xience DES). c. Myoview 04/2014: No ischmia/Infarction, normal EF. d. patent stent by cath in 10/2016.  Marland Kitchen Depression   . Diabetes mellitus without complication ([place])   . Diverticulosis of colon   . GERD (gastroesophageal reflux disease)   . Hiatal hernia   . History of MRSA infection   . Hyperlipidemia   . Hypertension   . Low testosterone   . OA (osteoarthritis)   . Palpitations   . Palpitations   . Sleep apnea    a. mild not on cpap   . Vasovagal episode   . Vitamin B 12 deficiency     Past Surgical History:  Procedure Laterality Date  . CARDIAC CATHETERIZATION N/A 10/23/2016   Procedure: Left  Heart Cath and Coronary Angiography;  Surgeon: Leonie Man, MD;  Location: [facility];  Service: Cardiovascular;  Laterality: N/A;  . CHOLECYSTECTOMY N/A 07/20/2013   Procedure: LAPAROSCOPIC CHOLECYSTECTOMY;  Surgeon: Joyice Faster. Cornett, MD;  Location: [facility];  Service: General;  Laterality: N/A;  . CORONARY ANGIOPLASTY  11/25/2013   DES   RCA    . LEFT HEART CATHETERIZATION WITH CORONARY ANGIOGRAM N/A 11/25/2013   Procedure: LEFT HEART CATHETERIZATION WITH CORONARY ANGIOGRAM;  Surgeon: Leonie Man, MD;  Location: [facility] CATH LAB;  Service: Cardiovascular;  Laterality: N/A;  . REPLACEMENT TOTAL KNEE     one on each knee  . TONSILLECTOMY    . TRANSTHORACIC ECHOCARDIOGRAM  February 2015     EF 60-65%, Gr1DD, mild AI with aortic sclerosis, mildly dilated LA.    Current Medications: Outpatient Medications Prior to Visit  Medication Sig Dispense Refill  . acetaminophen (TYLENOL) 325 MG tablet Take 2 tablets (650 mg total) by mouth every 4 (four) hours as needed for headache or mild pain.    . citalopram (CELEXA) 40 MG tablet Take 20 mg by mouth daily.     . clindamycin (CLEOCIN) 300 MG capsule Take 300 mg by mouth as needed (dental procedures).     . clopidogrel (PLAVIX) 75 MG tablet TAKE 1 TABLET BY MOUTH EVERY DAY 90 tablet 3  . Cyanocobalamin (VITAMIN B-12 PO) Take 1 tablet by mouth daily.    . famotidine (PEPCID) 40 MG tablet Take 40 mg by mouth 2 (two) times daily.     . ferrous sulfate 325 (65 FE) MG EC tablet Take 325 mg by mouth daily with breakfast.    . furosemide (LASIX) 20 MG tablet TAKE 1 TABLET (20 MG TOTAL) BY MOUTH DAILY. 30 tablet 5  . isosorbide mononitrate (IMDUR) 30 MG 24 hr tablet Take 1 tablet (30 mg total) by mouth daily. 90 tablet 3  . modafinil (PROVIGIL) 100 MG tablet Take 100 mg by mouth 2 (two) times daily.     . nitroGLYCERIN (NITROSTAT) 0.4 MG SL tablet Place 1 tablet (0.4 mg total) under the tongue every 5 (five) minutes as needed for chest pain. 25 tablet 2  . olmesartan (BENICAR) 40 MG tablet Take 1 tablet (40 mg total) by mouth daily. 90 tablet 3  . rosuvastatin (CRESTOR) 5 MG tablet Take 5 mg by mouth daily at 6 PM.     . testosterone cypionate (DEPOTESTOTERONE CYPIONATE) 100 MG/ML injection Inject 100 mg into the muscle every 14 (fourteen) days. For IM use only     No facility-administered medications prior to visit.      Allergies:   Ciprofloxacin; Esomeprazole magnesium; Penicillins; Soma [carisoprodol]; and Sulfa drugs cross reactors   Social History   Social History  . Marital status: Legally Separated    Spouse name: N/A  . Number of children: 2  . Years of education: N/A   Occupational History  . [employer]     retired   Social  History Main Topics  . Smoking status: Former Smoker    Years: 11.00    Quit date: 11/04/1967  . Smokeless tobacco: Never Used  . Alcohol use No  . Drug use: No  . Sexual activity: Not Asked   Other Topics Concern  . None   Social History Narrative   Married father of 2. Retired from [employer].   Former smoker - quit in 1969 after 11 years; denies alcohol consumption     Family History:  The  patient's family history includes Asthma in his mother; Heart attack in his father; Hypertension in his mother.   Review of Systems:   Please see the history of present illness.     General:  No chills, fever, night sweats or weight changes.  Cardiovascular:  No dyspnea on exertion, edema, orthopnea, palpitations, paroxysmal nocturnal dyspnea. Positive for chest pain.  Dermatological: No rash, lesions/masses Respiratory: No cough, dyspnea Urologic: No hematuria, dysuria Abdominal:   No nausea, vomiting, diarrhea, bright red blood per rectum, melena, or hematemesis Neurologic:  No visual changes, wkns, changes in mental status. All other systems reviewed and are otherwise negative except as noted above.   Physical Exam:    VS:  BP 109/66   Pulse 73   Ht 5\' 11"  (1.803 m)   Wt 207 lb 12.8 oz (94.3 kg)   BMI 28.98 kg/m    General: Well developed, well nourished Caucasian male appearing in no acute distress. Head: Normocephalic, atraumatic, sclera non-icteric, no xanthomas, nares are without discharge.  Neck: No carotid bruits. JVD not elevated.  Lungs: Respirations regular and unlabored, without wheezes or rales.  Heart: Regular rate and rhythm. No S3 or S4.  No murmur, no rubs, or gallops appreciated. Abdomen: Soft, non-tender, non-distended with normoactive bowel sounds. No hepatomegaly. No rebound/guarding. No obvious abdominal masses. Msk:  Strength and tone appear normal for age. No joint deformities or effusions. Extremities: No clubbing or cyanosis. No lower extremity edema.  Distal  pedal pulses are 2+ bilaterally. Neuro: Alert and oriented X 3. Moves all extremities spontaneously. No focal deficits noted. Psych:  Responds to questions appropriately with a normal affect. Skin: No rashes or lesions noted  Wt Readings from Last 3 Encounters:  01/09/17 207 lb 12.8 oz (94.3 kg)  01/04/17 203 lb (92.1 kg)  11/28/16 213 lb (96.6 kg)     Studies/Labs Reviewed:   EKG:  EKG is not ordered today. EKG from 01/04/2017 reviewed which showed NSR, HR 72, with known RBBB.  Recent Labs: 01/04/2017: BUN 21; Creatinine, Ser 1.23; Hemoglobin 16.8; Platelets 127; Potassium 4.5; Sodium 137   Lipid Panel No results found for: CHOL, TRIG, HDL, CHOLHDL, VLDL, LDLCALC, LDLDIRECT  Additional studies/ records that were reviewed today include:   Cardiac Catheterization: 10/23/2016  Prox RCA DES Stent: 0 %stenosed.  Otherwise minimal CAD  LV end diastolic pressure is normal.  The left ventricular systolic function is normal. The left ventricular ejection fraction is 50-55% by visual estimate.   Angiographic no evidence of any significant CAD. Nothing to suggest a reduced ejection fraction. Left ventriculography revealed relatively normal EF. I suspect that the nuclear stress test reading was inaccurate.  PLAN:  Return to short stay for TR band removal. Anticipate discharge later today.  He will follow-up post cath as scheduled.  Assessment:    1. CAD S/P percutaneous coronary angioplasty - RCA DES 11/25/13   2. Chest pain, unspecified type   3. Hyperlipidemia with target LDL less than 70   4. Essential hypertension      Plan:   In order of problems listed above:  1. CAD - s/p DES to RCA in 2015, repeat cath in 10/2016 showing widely patent RCA stent with minimal CAD. - EKG from 01/04/2017 reviewed which shows no acute ST or T-wave changes.  - continue Plavix, Imdur, and Crestor. Not on a BB secondary to history of bradycardia.   2. Atypical Chest Pain  - experienced an  episode of chest pain on 01/04/2017 which was partially  relieved with SL NTG. No association with exertion and not similar to prior episodes of UA. Reports being under an increased amount of stress as he is undergoing a divorce and recently started a new relationship.  - Labs from recent ED visit showed initial and delta troponin were negative with EKG showing no acute ischemic changes. No recent episodes of chest pain since hospital discharge.  - we discussed further titration of his Imdur but he defers this for now due to thinking his situation was stress related, which certainly seems reasonable. Will not pursue further ischemic evaluation at this time with recent cath in 10/2016 showing patent stent with minimal CAD.   3. HLD - followed by PCP.  - continue Crestor 5mg  daily. Goal LDL < 70.  4. HTN - BP well-controlled at 109/66 during today's visit.  - continue Imdur and Benicar.   Medication Adjustments/Labs and Tests Ordered: Current medicines are reviewed at length with the patient today.  Concerns regarding medicines are outlined above.  Medication changes, Labs and Tests ordered today are listed in the Patient Instructions below. Patient Instructions  Medication Instructions:  Your physician recommends that you continue on your current medications as directed. Please refer to the Current Medication list given to you today.  Labwork: None ordered  Testing/Procedures: None ordered  Follow-Up: Your physician wants you to follow-up in: [place] DR. [name]   You will receive a reminder letter in the mail two months in advance. If you don't receive a letter, please call our office to schedule the follow-up appointment.  Any Other Special Instructions Will Be Listed Below (If Applicable).  If you need a refill on your cardiac medications before your next appointment, please call your pharmacy.    Signed, Erma Heritage, [place]  01/09/2017 3:57 PM    [facility]  [facility], [place], [place]  [address] Phone: [phone]; Fax: [phone]  [address], [place], [place] [address] Phone: [phone]

## 2017-01-09 NOTE — Telephone Encounter (Signed)
Here for appt

## 2017-01-09 NOTE — Patient Instructions (Signed)
Medication Instructions:  Your physician recommends that you continue on your current medications as directed. Please refer to the Current Medication list given to you today.   Labwork: None ordered  Testing/Procedures: None ordered  Follow-Up: Your physician wants you to follow-up in: [place] DR. [name]   You will receive a reminder letter in the mail two months in advance. If you don't receive a letter, please call our office to schedule the follow-up appointment.   Any Other Special Instructions Will Be Listed Below (If Applicable).     If you need a refill on your cardiac medications before your next appointment, please call your pharmacy.

## 2017-01-12 DIAGNOSIS — I2510 Atherosclerotic heart disease of native coronary artery without angina pectoris: Secondary | ICD-10-CM | POA: Diagnosis not present

## 2017-01-12 DIAGNOSIS — E782 Mixed hyperlipidemia: Secondary | ICD-10-CM | POA: Diagnosis not present

## 2017-01-12 DIAGNOSIS — Z1389 Encounter for screening for other disorder: Secondary | ICD-10-CM | POA: Diagnosis not present

## 2017-01-12 DIAGNOSIS — I10 Essential (primary) hypertension: Secondary | ICD-10-CM | POA: Diagnosis not present

## 2017-01-12 DIAGNOSIS — K219 Gastro-esophageal reflux disease without esophagitis: Secondary | ICD-10-CM | POA: Diagnosis not present

## 2017-02-02 ENCOUNTER — Other Ambulatory Visit: Payer: Self-pay | Admitting: Cardiology

## 2017-02-02 NOTE — Telephone Encounter (Signed)
Rx request sent to pharmacy.  

## 2017-02-03 DIAGNOSIS — R5381 Other malaise: Secondary | ICD-10-CM | POA: Diagnosis not present

## 2017-02-03 DIAGNOSIS — E349 Endocrine disorder, unspecified: Secondary | ICD-10-CM | POA: Diagnosis not present

## 2017-02-03 DIAGNOSIS — D751 Secondary polycythemia: Secondary | ICD-10-CM | POA: Diagnosis not present

## 2017-03-02 ENCOUNTER — Telehealth: Payer: Self-pay | Admitting: Cardiology

## 2017-03-02 ENCOUNTER — Encounter (HOSPITAL_COMMUNITY): Payer: Self-pay

## 2017-03-02 ENCOUNTER — Observation Stay (HOSPITAL_COMMUNITY)
Admission: EM | Admit: 2017-03-02 | Discharge: 2017-03-03 | Disposition: A | Discharge status: Home / Self Care | Payer: Medicare Other | Attending: Cardiology | Admitting: Cardiology

## 2017-03-02 DIAGNOSIS — Z7982 Long term (current) use of aspirin: Secondary | ICD-10-CM | POA: Diagnosis not present

## 2017-03-02 DIAGNOSIS — Z7902 Long term (current) use of antithrombotics/antiplatelets: Secondary | ICD-10-CM | POA: Diagnosis not present

## 2017-03-02 DIAGNOSIS — E119 Type 2 diabetes mellitus without complications: Secondary | ICD-10-CM | POA: Diagnosis not present

## 2017-03-02 DIAGNOSIS — I2510 Atherosclerotic heart disease of native coronary artery without angina pectoris: Secondary | ICD-10-CM | POA: Diagnosis not present

## 2017-03-02 DIAGNOSIS — E785 Hyperlipidemia, unspecified: Secondary | ICD-10-CM | POA: Diagnosis present

## 2017-03-02 DIAGNOSIS — Z79899 Other long term (current) drug therapy: Secondary | ICD-10-CM | POA: Insufficient documentation

## 2017-03-02 DIAGNOSIS — Z9861 Coronary angioplasty status: Secondary | ICD-10-CM

## 2017-03-02 DIAGNOSIS — I10 Essential (primary) hypertension: Secondary | ICD-10-CM | POA: Diagnosis not present

## 2017-03-02 DIAGNOSIS — R072 Precordial pain: Principal | ICD-10-CM | POA: Insufficient documentation

## 2017-03-02 DIAGNOSIS — Z96659 Presence of unspecified artificial knee joint: Secondary | ICD-10-CM | POA: Diagnosis not present

## 2017-03-02 DIAGNOSIS — R079 Chest pain, unspecified: Secondary | ICD-10-CM | POA: Diagnosis not present

## 2017-03-02 DIAGNOSIS — Z87891 Personal history of nicotine dependence: Secondary | ICD-10-CM | POA: Insufficient documentation

## 2017-03-02 DIAGNOSIS — R0789 Other chest pain: Secondary | ICD-10-CM | POA: Diagnosis present

## 2017-03-02 LAB — CBC
HEMATOCRIT: 43.7 % (ref 39.0–52.0)
Hemoglobin: 15.4 g/dL (ref 13.0–17.0)
MCH: 30.3 pg (ref 26.0–34.0)
MCHC: 35.2 g/dL (ref 30.0–36.0)
MCV: 86 fL (ref 78.0–100.0)
Platelets: 114 10*3/uL — ABNORMAL LOW (ref 150–400)
RBC: 5.08 MIL/uL (ref 4.22–5.81)
RDW: 12.9 % (ref 11.5–15.5)
WBC: 6.2 10*3/uL (ref 4.0–10.5)

## 2017-03-02 LAB — BASIC METABOLIC PANEL
ANION GAP: 9 (ref 5–15)
BUN: 31 mg/dL — AB (ref 6–20)
CO2: 24 mmol/L (ref 22–32)
Calcium: 9.2 mg/dL (ref 8.9–10.3)
Chloride: 104 mmol/L (ref 101–111)
Creatinine, Ser: 1.24 mg/dL (ref 0.61–1.24)
GFR, EST NON AFRICAN AMERICAN: 54 mL/min — AB (ref >60)
Glucose, Bld: 117 mg/dL — ABNORMAL HIGH (ref 65–99)
POTASSIUM: 4.5 mmol/L (ref 3.5–5.1)
SODIUM: 137 mmol/L (ref 135–145)

## 2017-03-02 LAB — I-STAT TROPONIN, ED: Troponin i, poc: 0 ng/mL (ref 0.00–0.08)

## 2017-03-02 LAB — TROPONIN I

## 2017-03-02 MED ORDER — CITALOPRAM HYDROBROMIDE 20 MG PO TABS
20.0000 mg | ORAL_TABLET | Freq: Every day | ORAL | Status: DC
  Administered 2017-03-03: 20 mg via ORAL
  Filled 2017-03-02: qty 1

## 2017-03-02 MED ORDER — SODIUM CHLORIDE 0.9% FLUSH
3.0000 mL | Freq: Two times a day (BID) | INTRAVENOUS | Status: DC
  Administered 2017-03-02 – 2017-03-03 (×2): 3 mL via INTRAVENOUS

## 2017-03-02 MED ORDER — ONDANSETRON HCL 4 MG/2ML IJ SOLN: 4.0000 mg | Freq: Four times a day (QID) | INTRAMUSCULAR | Status: DC | PRN

## 2017-03-02 MED ORDER — MODAFINIL 100 MG PO TABS
100.0000 mg | ORAL_TABLET | Freq: Two times a day (BID) | ORAL | Status: DC
  Administered 2017-03-03: 100 mg via ORAL
  Filled 2017-03-02: qty 1

## 2017-03-02 MED ORDER — SODIUM CHLORIDE 0.9% FLUSH: 3.0000 mL | INTRAVENOUS | Status: DC | PRN

## 2017-03-02 MED ORDER — CLOPIDOGREL BISULFATE 75 MG PO TABS
75.0000 mg | ORAL_TABLET | Freq: Every day | ORAL | Status: DC
  Administered 2017-03-03: 75 mg via ORAL
  Filled 2017-03-02: qty 1

## 2017-03-02 MED ORDER — NITROGLYCERIN 0.4 MG SL SUBL: 0.4000 mg | SUBLINGUAL_TABLET | SUBLINGUAL | Status: DC | PRN

## 2017-03-02 MED ORDER — IRBESARTAN 150 MG PO TABS
300.0000 mg | ORAL_TABLET | Freq: Every day | ORAL | Status: DC
  Administered 2017-03-03: 300 mg via ORAL
  Filled 2017-03-02: qty 2

## 2017-03-02 MED ORDER — FAMOTIDINE 20 MG PO TABS
40.0000 mg | ORAL_TABLET | Freq: Two times a day (BID) | ORAL | Status: DC
  Administered 2017-03-02 – 2017-03-03 (×2): 40 mg via ORAL
  Filled 2017-03-02 (×2): qty 2

## 2017-03-02 MED ORDER — SODIUM CHLORIDE 0.9 % IV SOLN: 250.0000 mL | INTRAVENOUS | Status: DC | PRN

## 2017-03-02 MED ORDER — HEPARIN SODIUM (PORCINE) 5000 UNIT/ML IJ SOLN: 5000.0000 [IU] | Freq: Three times a day (TID) | INTRAMUSCULAR | Status: DC

## 2017-03-02 MED ORDER — ALPRAZOLAM 0.25 MG PO TABS: 0.2500 mg | ORAL_TABLET | Freq: Two times a day (BID) | ORAL | Status: DC | PRN

## 2017-03-02 MED ORDER — ISOSORBIDE MONONITRATE ER 60 MG PO TB24
60.0000 mg | ORAL_TABLET | Freq: Every day | ORAL | Status: DC
  Filled 2017-03-02: qty 1

## 2017-03-02 MED ORDER — ASPIRIN 300 MG RE SUPP: 300.0000 mg | RECTAL | Status: DC

## 2017-03-02 MED ORDER — ROSUVASTATIN CALCIUM 10 MG PO TABS: 5.0000 mg | ORAL_TABLET | Freq: Every day | ORAL | Status: DC

## 2017-03-02 MED ORDER — ASPIRIN EC 81 MG PO TBEC
81.0000 mg | DELAYED_RELEASE_TABLET | Freq: Every day | ORAL | Status: DC
  Administered 2017-03-03: 81 mg via ORAL
  Filled 2017-03-02: qty 1

## 2017-03-02 MED ORDER — ASPIRIN 81 MG PO CHEW
324.0000 mg | CHEWABLE_TABLET | ORAL | Status: DC
Start: 2017-03-02 — End: 2017-03-03

## 2017-03-02 MED ORDER — FUROSEMIDE 20 MG PO TABS
20.0000 mg | ORAL_TABLET | Freq: Every day | ORAL | Status: DC
  Administered 2017-03-03: 20 mg via ORAL
  Filled 2017-03-02: qty 1

## 2017-03-02 MED ORDER — ACETAMINOPHEN 325 MG PO TABS: 650.0000 mg | ORAL_TABLET | ORAL | Status: DC | PRN

## 2017-03-02 NOTE — H&P (Signed)
Johnny Kane is an 76 y.o. male.    Primary Cardiologist:Dr. Ellyn Hack  ACZ:[id number], Estill Bamberg., MD  Chief Complaint: chest pain and SOB for 1 week, today stuttering chest pain with relief with NTG  HPI:  78 yom with hx of CAD with PCI to RCA.  In Dec he had chest pain and abnormal nuc and underwent cardiac cath 10/2017.  This revealed widely patent RCA stent and otherwise minimal CAD EF 50-55%. Normal LVEDP.   In March he was seen in ER for chest pain and neg troponin. On follow up no EKG changes frelt to be atypical and pain had resolved.    Over the last couple of weeks he wakes with aches all over and chest pain and SOB and he has had to take NTG most mornings 1 NTG and he is fine for the day.   Today he had chest pressure and SOB  he took the NTG and he did not feel that much better.  He repeated the NTG with relief.  Later in day discomfort returned and he took ASA and he felt better for a short amount of time.  Then discomfort returned so he called EMS.    Here troponin poc has been neg X 3 and EKG SR with RBBB with no acute changes from previous.  He is pain free now.   Labs are stable with Cr 1.24, Na 137, K+ 4.5 and CBC normal with platelets of 114.   Platelets run a little low and have since 2017.    Past Medical History:  Diagnosis Date  . Aortic calcification (HCC)    No evidence of AAA on CT Abd in 2013  . Aortic valve sclerosis    a. 12/2013 Echo: Aortic Sclerosis; EF 60-65%, Gr1 DD, mild AI, mildly dil LA.  Marland Kitchen CAD S/P percutaneous coronary angioplasty    a. 07/2011 Nl MV;  b. 11/2013 Cath: LM nl, LAD 20p, D1/2 nl, LCX 30p, OM1 40, OM2 nl, RCA 90-24m(3.5x18 Xience DES). c. Myoview 04/2014: No ischmia/Infarction, normal EF. d. patent stent by cath in 10/2016.  .Marland KitchenDepression   . Diabetes mellitus without complication ([place]   . Diverticulosis of colon   . GERD (gastroesophageal reflux disease)   . Hiatal hernia   . History of MRSA infection   . Hyperlipidemia     . Hypertension   . Low testosterone   . OA (osteoarthritis)   . Palpitations   . Palpitations   . Sleep apnea    a. mild not on cpap   . Vasovagal episode   . Vitamin B 12 deficiency     Past Surgical History:  Procedure Laterality Date  . CARDIAC CATHETERIZATION N/A 10/23/2016   Procedure: Left Heart Cath and Coronary Angiography;  Surgeon: DLeonie Man MD;  [facility];  Service: Cardiovascular;  Laterality: N/A;  . CHOLECYSTECTOMY N/A 07/20/2013   Procedure: LAPAROSCOPIC CHOLECYSTECTOMY;  Surgeon: TJoyice Faster Cornett, MD;  Location: [facility];  Service: General;  Laterality: N/A;  . CORONARY ANGIOPLASTY  11/25/2013   DES   RCA    . LEFT HEART CATHETERIZATION WITH CORONARY ANGIOGRAM N/A 11/25/2013   Procedure: LEFT HEART CATHETERIZATION WITH CORONARY ANGIOGRAM;  Surgeon: DLeonie Man MD;  [facility];  Service: Cardiovascular;  Laterality: N/A;  . REPLACEMENT TOTAL KNEE     one on each knee  . TONSILLECTOMY    . TRANSTHORACIC ECHOCARDIOGRAM  February 2015  EF 60-65%, Gr1DD, mild AI with aortic sclerosis, mildly dilated LA.    Family History  Problem Relation Age of Onset  . Asthma Mother   . Hypertension Mother   . Heart attack Father    Social History:  reports that he quit smoking about 49 years ago. He quit after 11.00 years of use. He has never used smokeless tobacco. He reports that he does not drink alcohol or use drugs.  Allergies:  Allergies  Allergen Reactions  . Ciprofloxacin Hives and Itching  . Esomeprazole Magnesium     unknown  . Penicillins Swelling    Hands and facial swelling   Has patient had a PCN reaction causing immediate rash, facial/tongue/throat swelling, SOB or lightheadedness with hypotension: no Has patient had a PCN reaction causing severe rash involving mucus membranes or skin necrosis: {no Has patient had a PCN reaction that required hospitalization {no Has patient had a PCN reaction occurring within the  last 10 years: {no If all of the above answers are "NO", then may proceed with Cephalosporin use.  Manuela Neptune [Carisoprodol]     unknown  . Sulfa Drugs Cross Reactors     unknown    OUTPATIENT  MEDS: No current facility-administered medications on file prior to encounter.    Current Outpatient Prescriptions on File Prior to Encounter  Medication Sig Dispense Refill  . acetaminophen (TYLENOL) 325 MG tablet Take 2 tablets (650 mg total) by mouth every 4 (four) hours as needed for headache or mild pain.    . citalopram (CELEXA) 40 MG tablet Take 20 mg by mouth daily.     . clindamycin (CLEOCIN) 300 MG capsule Take 300 mg by mouth as needed (dental procedures).     . clopidogrel (PLAVIX) 75 MG tablet TAKE 1 TABLET BY MOUTH EVERY DAY 90 tablet 3  . Cyanocobalamin (VITAMIN B-12 PO) Take 1 tablet by mouth daily.    . famotidine (PEPCID) 40 MG tablet Take 40 mg by mouth 2 (two) times daily.     . ferrous sulfate 325 (65 FE) MG EC tablet Take 325 mg by mouth daily with breakfast.    . furosemide (LASIX) 20 MG tablet TAKE 1 TABLET (20 MG TOTAL) BY MOUTH DAILY. 30 tablet 5  . isosorbide mononitrate (IMDUR) 30 MG 24 hr tablet Take 1 tablet (30 mg total) by mouth daily. 90 tablet 3  . modafinil (PROVIGIL) 100 MG tablet Take 100 mg by mouth 2 (two) times daily.     . nitroGLYCERIN (NITROSTAT) 0.4 MG SL tablet PLACE 1 TABLET (0.4 MG TOTAL) UNDER THE TONGUE EVERY 5 (FIVE) MINUTES AS NEEDED FOR CHEST PAIN. 25 tablet 2  . olmesartan (BENICAR) 40 MG tablet Take 1 tablet (40 mg total) by mouth daily. 90 tablet 3  . rosuvastatin (CRESTOR) 5 MG tablet Take 5 mg by mouth daily at 6 PM.     . testosterone cypionate (DEPOTESTOTERONE CYPIONATE) 100 MG/ML injection Inject 100 mg into the muscle every 14 (fourteen) days. For IM use only     He takes Celexa 40 mg daily not 20 mg.   Results for orders placed or performed during the hospital encounter of 03/02/17 (from the past 48 hour(s))  Basic metabolic panel      Status: Abnormal   Collection Time: 03/02/17  6:35 PM  Result Value Ref Range   Sodium 137 135 - 145 mmol/L   Potassium 4.5 3.5 - 5.1 mmol/L   Chloride 104 101 - 111 mmol/L   CO2 24  22 - 32 mmol/L   Glucose, Bld 117 (H) 65 - 99 mg/dL   BUN 31 (H) 6 - 20 mg/dL   Creatinine, Ser 1.24 0.61 - 1.24 mg/dL   Calcium 9.2 8.9 - 10.3 mg/dL   GFR calc non Af Amer 54 (L) >60 mL/min   GFR calc Af Amer >60 >60 mL/min    Comment: (NOTE) The eGFR has been calculated using the CKD EPI equation. This calculation has not been validated in all clinical situations. eGFR's persistently <60 mL/min signify possible Chronic Kidney Disease.    Anion gap 9 5 - 15  CBC     Status: Abnormal   Collection Time: 03/02/17  6:35 PM  Result Value Ref Range   WBC 6.2 4.0 - 10.5 K/uL   RBC 5.08 4.22 - 5.81 MIL/uL   Hemoglobin 15.4 13.0 - 17.0 g/dL   HCT 43.7 39.0 - 52.0 %   MCV 86.0 78.0 - 100.0 fL   MCH 30.3 26.0 - 34.0 pg   MCHC 35.2 30.0 - 36.0 g/dL   RDW 12.9 11.5 - 15.5 %   Platelets 114 (L) 150 - 400 K/uL    Comment: REPEATED TO VERIFY PLATELET COUNT CONFIRMED BY SMEAR   I-stat troponin, ED     Status: None   Collection Time: 03/02/17  6:49 PM  Result Value Ref Range   Troponin i, poc 0.00 0.00 - 0.08 ng/mL   Comment 3            Comment: Due to the release kinetics of cTnI, a negative result within the first hours of the onset of symptoms does not rule out myocardial infarction with certainty. If myocardial infarction is still suspected, repeat the test at appropriate intervals.    No results found.  ROS: General:no colds or fevers, no weight changes- trying to lose wt  Skin:no rashes or ulcers HEENT:no blurred vision, no congestion CV:see HPI PUL:see HPI GI:no diarrhea constipation or melena, no indigestion- pepcid has helped when he did have indigestion GU:no hematuria, no dysuria MS:no joint pain, no claudication Neuro:no syncope, no lightheadedness Endo:no diabetes, no thyroid  disease  Blood pressure 129/76, pulse 70, temperature 98.1 F (36.7 C), temperature source Oral, resp. rate 16, height _0  (1.803 m), weight 199 lb (90.3 kg), SpO2 97 %. PE: General:Pleasant affect, NAD Skin:Warm and dry, brisk capillary refill HEENT:normocephalic, sclera clear, mucus membranes moist Neck:supple, no JVD, no bruits  Heart:S1S2 RRR without murmur, gallup, rub or click Lungs:clear without rales, rhonchi, or wheezes HDQ:QIWL, non tender, + BS, do not palpate liver spleen or masses Ext:no lower ext edema, 2+ pedal pulses, 2+ radial pulses Neuro:alert and oriented X 3, MAE, follows commands, + facial symmetry    Assessment/Plan Principal Problem:   Chest pain Active Problems:   Essential hypertension   Hyperlipidemia with target LDL less than 70   CAD S/P percutaneous coronary angioplasty - RCA DES 11/25/13   Chest pain has been increasing over last 2 weeks.  Now increased with neg troponin POC and no EKG changes.  Last cath in Dec with patent stent and minimal CAD.  Will admit to obs and follow troponin.  Will hold IV heparin unless troponins + will use VTE prophylaxis with subcu heparin. No BB due to hx of bradycardia with BB. - will increase imdur to 60 mg to see if this will improve.  CAD with hx Stent to RCA and patent in Dec 2017.  HLD controlled on Crestor.  HTN essential up some  now.      Cecilie Kicks Nurse Practitioner Certified [place] Pager [phone] or after 5pm or weekends call [phone] 03/02/2017, 8:25 PM   Patient seen and examined.  Chart reviewed of note he has had significant chest discomfort including a catheterization in December at which time the stent was patent.  Comes in today with increasing history of nitroglycerin use although history is somewhat atypical.  Of note the patient's wife left him this past summer and there are frequent notes in the chart about emotional stress related to this and I wonder whether  this event has had anything to do with the frequency of symptoms that he has been having.  His exam is unremarkable at the present time.  Blood pressure is mildly elevated.  1.  Chest discomfort with some typical other atypical features with an EKG personally reviewed by me showing a right bundle branch block but no ischemic changes 2.  CAD with previous stent that was widely patent in December 3.  Hypertension currently above goal 4.  Significant emotional stress  Recommendations:  Agree with rule out overnight.  Would not start heparin at the present time unless enzymes elevated.  Might consider adjustment of medications although I'm not convinced this is all angina.  Kerry Hough. MD [facility] 8:41 PM 03/02/2017

## 2017-03-02 NOTE — ED Notes (Signed)
Cardiology at bedside.

## 2017-03-02 NOTE — Telephone Encounter (Signed)
New message      Pt c/o of Chest Pain: STAT if CP now or developed within 24 hours  1. Are you having CP right now?  yes  2. Are you experiencing any other symptoms (ex. SOB, nausea, vomiting, sweating)?  sob 3. How long have you been experiencing CP?  All go today  4. Is your CP continuous or coming and going?  Comes and goes  5. Have you taken Nitroglycerin?  Took 2 nitro and 1 baby aspirin?

## 2017-03-02 NOTE — Telephone Encounter (Signed)
Spoke with pt he states that he is currently having chest pain that is not relieved by nitro X2 and baby asa. Informed pt to go directly to the ER now. Pt states that he does not want to go now and will wait 20 minutes to "see if it goes away" informed pt that cardiacally time is of the essence and he soul call 911 now and go to the ER. Pt verbally acknowledges.

## 2017-03-02 NOTE — ED Provider Notes (Signed)
[facility] DEPT Provider Note   CSN: [id number] Arrival date & time: 03/02/17  1821     History   Chief Complaint Chief Complaint  Patient presents with  . Chest Pain  . Shortness of Breath    HPI Johnny Kane is a 76 y.o. male.  HPI Patient reports he has been experiencing chest pain and shortness of breath for approximately a week. He reports for several days now however he has been waking up and feeling extremely fatigued and with a pressure and heaviness in his chest and arms. He states that he takes a nitroglycerin and then rests a while and then it is improved and he can resume some of his usual activities. As of today he had to take a nitroglycerin again at about 1:30 in the afternoon with improvement of symptoms. Around 3 PM the pain returned again and he added an aspirin, the patient takes daily Plavix, and felt improved. After discussing his symptoms with his PCP, the patient was advised to come to the emergency department. He does not have chest pain at the time of evaluation.   He is taking testosterone therapy. Patient does endorse significant personal social stressors. Past Medical History:  Diagnosis Date  . Aortic calcification (HCC)    No evidence of AAA on CT Abd in 2013  . Aortic valve sclerosis    a. 12/2013 Echo: Aortic Sclerosis; EF 60-65%, Gr1 DD, mild AI, mildly dil LA.  Marland Kitchen CAD S/P percutaneous coronary angioplasty    a. 07/2011 Nl MV;  b. 11/2013 Cath: LM nl, LAD 20p, D1/2 nl, LCX 30p, OM1 40, OM2 nl, RCA 90-35m (3.5x18 Xience DES). c. Myoview 04/2014: No ischmia/Infarction, normal EF. d. patent stent by cath in 10/2016.  Marland Kitchen Depression   . Diabetes mellitus without complication ([place])   . Diverticulosis of colon   . GERD (gastroesophageal reflux disease)   . Hiatal hernia   . History of MRSA infection   . Hyperlipidemia   . Hypertension   . Low testosterone   . OA (osteoarthritis)   . Palpitations   . Palpitations   . Sleep apnea    a. mild not on  cpap   . Vasovagal episode   . Vitamin B 12 deficiency     Patient Active Problem List   Diagnosis Date Noted  . Chest pain 03/02/2017  . Abnormal nuclear stress test - due to inaccurate assessment of EF 10/23/2016  . Near syncope 12/26/2015  . Symptomatic PVCs 12/07/2014  . Memory loss or impairment 11/16/2014  . Chest pain with low risk for cardiac etiology 04/04/2014  . Obesity (BMI 30-39.9) 04/04/2014  . Fatigue 04/04/2014  . Weakness 03/07/2014  . CAD S/P percutaneous coronary angioplasty - RCA DES 11/25/13 11/26/2013  . AAA (abdominal aortic aneurysm) ([place])   . Essential hypertension   . Hyperlipidemia with target LDL less than 70   . DOE (dyspnea on exertion) -      Past Surgical History:  Procedure Laterality Date  . CARDIAC CATHETERIZATION N/A 10/23/2016   Procedure: Left Heart Cath and Coronary Angiography;  Surgeon: Leonie Man, MD;  Location: [facility] [place] [facility];  Service: Cardiovascular;  Laterality: N/A;  . CHOLECYSTECTOMY N/A 07/20/2013   Procedure: LAPAROSCOPIC CHOLECYSTECTOMY;  Surgeon: Joyice Faster. Cornett, MD;  Location: [facility];  Service: General;  Laterality: N/A;  . CORONARY ANGIOPLASTY  11/25/2013   DES   RCA    . LEFT HEART CATHETERIZATION WITH CORONARY ANGIOGRAM N/A 11/25/2013   Procedure:  LEFT HEART CATHETERIZATION WITH CORONARY ANGIOGRAM;  Surgeon: Leonie Man, MD;  Location: [facility] CATH LAB;  Service: Cardiovascular;  Laterality: N/A;  . REPLACEMENT TOTAL KNEE     one on each knee  . TONSILLECTOMY    . TRANSTHORACIC ECHOCARDIOGRAM  February 2015   EF 60-65%, Gr1DD, mild AI with aortic sclerosis, mildly dilated LA.       Home Medications    Prior to Admission medications   Medication Sig Start Date End Date Taking? Authorizing Provider  acetaminophen (TYLENOL) 325 MG tablet Take 2 tablets (650 mg total) by mouth every 4 (four) hours as needed for headache or mild pain. Patient taking differently: Take 325 mg by mouth daily as needed (pain).   11/26/13  Yes Erlene Quan, PA-C  aspirin EC 81 MG tablet Take 81-324 mg by mouth See admin instructions. 1 tablet (81 mg) once at 1500 03/02/17 and 4 tablets (324 mg) once at 1700 03/02/17   Yes Historical Provider, MD  citalopram (CELEXA) 40 MG tablet Take 40 mg by mouth daily after lunch.    Yes Historical Provider, MD  clindamycin (CLEOCIN) 150 MG capsule Take 600 mg by mouth See admin instructions. Take 4 capsules (600 mg) by mouth one hour prior to dental procedure   Yes Historical Provider, MD  clopidogrel (PLAVIX) 75 MG tablet TAKE 1 TABLET BY MOUTH EVERY DAY Patient taking differently: TAKE 1 TABLET BY MOUTH EVERY DAY AFTER LUNCH 12/12/16  Yes Leonie Man, MD  Cyanocobalamin (VITAMIN B-12) 5000 MCG TBDP Take 5,000 mcg by mouth daily after lunch.   Yes Historical Provider, MD  famotidine (PEPCID) 20 MG tablet Take 20 mg by mouth See admin instructions. Take 1 tablet (20 mg) by mouth twice daily - after lunch and at bedtime 01/12/17  Yes Historical Provider, MD  ferrous sulfate 325 (65 FE) MG EC tablet Take 325 mg by mouth daily after lunch.    Yes Historical Provider, MD  furosemide (LASIX) 20 MG tablet TAKE 1 TABLET (20 MG TOTAL) BY MOUTH DAILY. Patient taking differently: Take 20 mg by mouth daily after lunch.  08/25/16  Yes Leonie Man, MD  Guaifenesin [facility] MAXIMUM STRENGTH) 1200 MG TB12 Take 1,200 mg by mouth daily as needed (congestion).   Yes Historical Provider, MD  isosorbide mononitrate (IMDUR) 30 MG 24 hr tablet Take 30 mg by mouth at bedtime.   Yes Historical Provider, MD  modafinil (PROVIGIL) 200 MG tablet Take 200 mg by mouth See admin instructions. Take 1 tablet (200 mg) by mouth twice daily - every morning and at 4:30pm   Yes Historical Provider, MD  nitroGLYCERIN (NITROSTAT) 0.4 MG SL tablet PLACE 1 TABLET (0.4 MG TOTAL) UNDER THE TONGUE EVERY 5 (FIVE) MINUTES AS NEEDED FOR CHEST PAIN. 02/02/17  Yes Leonie Man, MD  olmesartan (BENICAR) 40 MG tablet Take 1 tablet (40  mg total) by mouth daily. Patient taking differently: Take 40 mg by mouth daily after lunch.  10/21/16  Yes Isaiah Serge, NP  polyethylene glycol (MIRALAX / GLYCOLAX) packet Take 17 g by mouth daily as needed (constipation). Mix in 8 oz liquid and drink   Yes Historical Provider, MD  rosuvastatin (CRESTOR) 5 MG tablet Take 5 mg by mouth at bedtime.  04/09/16  Yes Historical Provider, MD  testosterone cypionate (DEPOTESTOTERONE CYPIONATE) 100 MG/ML injection Inject 50 mg into the muscle every 14 (fourteen) days. For IM use only - injected at [facility] in [place] - last injection approx 1st  week of April   Yes Historical Provider, MD    Family History Family History  Problem Relation Age of Onset  . Asthma Mother   . Hypertension Mother   . Heart attack Father     Social History Social History  Substance Use Topics  . Smoking status: Former Smoker    Years: 11.00    Quit date: 11/04/1967  . Smokeless tobacco: Never Used  . Alcohol use No     Allergies   Ciprofloxacin; Esomeprazole magnesium; Penicillins; Sulfa drugs cross reactors; and Soma [carisoprodol]   Review of Systems Review of Systems 10 Systems reviewed and are negative for acute change except as noted in the HPI.   Physical Exam Updated Vital Signs BP (!) 133/92   Pulse 66   Temp 98.1 F (36.7 C) (Oral)   Resp 17   Ht 5\' 11"  (1.803 m)   Wt 199 lb (90.3 kg)   SpO2 98%   BMI 27.75 kg/m   Physical Exam  Constitutional: He is oriented to person, place, and time. He appears well-developed and well-nourished.  HENT:  Head: Normocephalic and atraumatic.  Mouth/Throat: Oropharynx is clear and moist.  Eyes: Conjunctivae are normal.  Neck: Neck supple.  Cardiovascular: Normal rate and regular rhythm.   No murmur heard. Pulmonary/Chest: Effort normal and breath sounds normal. No respiratory distress.  Abdominal: Soft. There is no tenderness.  Musculoskeletal: He exhibits no edema or tenderness.    Neurological: He is alert and oriented to person, place, and time. No cranial nerve deficit. He exhibits normal muscle tone. Coordination normal.  Skin: Skin is warm and dry.  Psychiatric: He has a normal mood and affect.  Nursing note and vitals reviewed.    ED Treatments / Results  Labs (all labs ordered are listed, but only abnormal results are displayed) Labs Reviewed  BASIC METABOLIC PANEL - Abnormal; Notable for the following:       Result Value   Glucose, Bld 117 (*)    BUN 31 (*)    GFR calc non Af Amer 54 (*)    All other components within normal limits  CBC - Abnormal; Notable for the following:    Platelets 114 (*)    All other components within normal limits  I-STAT TROPOININ, ED    EKG  EKG Interpretation  Date/Time:  Monday March 02 2017 18:31:44 EDT Ventricular Rate:  74 PR Interval:    QRS Duration: 143 QT Interval:  402 QTC Calculation: 446 R Axis:   54 Text Interpretation:  Sinus rhythm Right bundle branch block agree. no change from previous  Confirmed by Johnney Killian, MD, Jeannie Done [phone]) on 03/02/2017 6:38:16 PM       Radiology No results found.  Procedures Procedures (including critical care time)  Medications Ordered in ED Medications  famotidine (PEPCID) tablet 40 mg (not administered)     Initial Impression / Assessment and Plan / ED Course  I have reviewed the triage vital signs and the nursing notes.  Pertinent labs & imaging results that were available during my care of the patient were reviewed by me and considered in my medical decision making (see chart for details).    Consult: Cardiology for evaluation. Final Clinical Impressions(s) / ED Diagnoses   Final diagnoses:  Precordial pain  Patient reports increasing frequency of chest pain that is unrelieved by nitroglycerin. At this time, he does not have active chest pain and first troponin is negative. Cardiologist consult at and plan has been made for observation  for chest pain  rule out MI. Patient had taken aspirin and Plavix at home prior to arrival.  [place] Prescriptions New Prescriptions   No medications on file     Charlesetta Shanks, MD 03/02/17 2239

## 2017-03-02 NOTE — ED Triage Notes (Signed)
Pt via EMS with CP and SOB x 1 week. Per EMS, pt with hx of angina, states he usually wakes up in the AM with central chest pain radiating to both arms which is typically relieved by NTG. This AM pt reports his SOB was worse than normal. Pt took 1 NTG @ 0930 which relieved his pain. His pain returned at 1330 which was again relieved with 1 NTG. At 1500 pain returned, pt took 81 mg ASA and his pain has not since returned. Pt called his PCP who sent him to the ED for further assessment. Pt denies active CP, N/V. Pt respirations even and unlabored. Per EMS RBBB on 12-lead EKG, 142/92, 76 HR, 24 RR, 97% on RA, CBG 122

## 2017-03-03 ENCOUNTER — Observation Stay (HOSPITAL_BASED_OUTPATIENT_CLINIC_OR_DEPARTMENT_OTHER): Payer: Medicare Other

## 2017-03-03 DIAGNOSIS — Z9861 Coronary angioplasty status: Secondary | ICD-10-CM | POA: Diagnosis not present

## 2017-03-03 DIAGNOSIS — I10 Essential (primary) hypertension: Secondary | ICD-10-CM

## 2017-03-03 DIAGNOSIS — E785 Hyperlipidemia, unspecified: Secondary | ICD-10-CM | POA: Diagnosis not present

## 2017-03-03 DIAGNOSIS — R079 Chest pain, unspecified: Secondary | ICD-10-CM

## 2017-03-03 DIAGNOSIS — I2510 Atherosclerotic heart disease of native coronary artery without angina pectoris: Secondary | ICD-10-CM | POA: Diagnosis not present

## 2017-03-03 HISTORY — PX: TRANSTHORACIC ECHOCARDIOGRAM: SHX275

## 2017-03-03 LAB — BASIC METABOLIC PANEL
ANION GAP: 5 (ref 5–15)
BUN: 28 mg/dL — ABNORMAL HIGH (ref 6–20)
CHLORIDE: 105 mmol/L (ref 101–111)
CO2: 30 mmol/L (ref 22–32)
Calcium: 9.2 mg/dL (ref 8.9–10.3)
Creatinine, Ser: 1.39 mg/dL — ABNORMAL HIGH (ref 0.61–1.24)
GFR calc Af Amer: 54 mL/min — ABNORMAL LOW (ref >60)
GFR, EST NON AFRICAN AMERICAN: 47 mL/min — AB (ref >60)
Glucose, Bld: 86 mg/dL (ref 65–99)
POTASSIUM: 4.1 mmol/L (ref 3.5–5.1)
SODIUM: 140 mmol/L (ref 135–145)

## 2017-03-03 LAB — TROPONIN I
Troponin I: <0.03 ng/mL (ref <0.03)
Troponin I: <0.03 ng/mL (ref <0.03)

## 2017-03-03 LAB — CBC
HEMATOCRIT: 43.7 % (ref 39.0–52.0)
HEMOGLOBIN: 15 g/dL (ref 13.0–17.0)
MCH: 29.6 pg (ref 26.0–34.0)
MCHC: 34.3 g/dL (ref 30.0–36.0)
MCV: 86.4 fL (ref 78.0–100.0)
Platelets: 102 10*3/uL — ABNORMAL LOW (ref 150–400)
RBC: 5.06 MIL/uL (ref 4.22–5.81)
RDW: 12.9 % (ref 11.5–15.5)
WBC: 5.6 10*3/uL (ref 4.0–10.5)

## 2017-03-03 LAB — LIPID PANEL
CHOL/HDL RATIO: 3.5 ratio
Cholesterol: 118 mg/dL (ref 0–200)
HDL: 34 mg/dL — ABNORMAL LOW (ref >40)
LDL Cholesterol: 64 mg/dL (ref 0–99)
TRIGLYCERIDES: 98 mg/dL (ref <150)
VLDL: 20 mg/dL (ref 0–40)

## 2017-03-03 LAB — PROTIME-INR
INR: 1.04
Prothrombin Time: 13.7 seconds (ref 11.4–15.2)

## 2017-03-03 LAB — ECHOCARDIOGRAM COMPLETE
Height: 71 in
Weight: 3188.73 oz

## 2017-03-03 MED ORDER — ISOSORBIDE MONONITRATE ER 60 MG PO TB24: 90.0000 mg | ORAL_TABLET | Freq: Every day | ORAL | 5 refills | Status: DC

## 2017-03-03 MED ORDER — ISOSORBIDE MONONITRATE ER 60 MG PO TB24
90.0000 mg | ORAL_TABLET | Freq: Every day | ORAL | Status: DC
  Administered 2017-03-03: 10:00:00 90 mg via ORAL

## 2017-03-03 NOTE — Plan of Care (Signed)
Problem: Pain Managment: Goal: General experience of comfort will improve Outcome: Progressing Patient has had no complaints of pain, will continue to monitor.

## 2017-03-03 NOTE — Progress Notes (Signed)
Patient Name: Johnny Kane Date of Encounter: 03/03/2017  Primary Cardiologist: Dr. Beckie Salts Problem List     Principal Problem:   Chest pain Active Problems:   Essential hypertension   Hyperlipidemia with target LDL less than 70   CAD S/P percutaneous coronary angioplasty - RCA DES 11/25/13     Subjective   No further CP overnight  Inpatient Medications    Scheduled Meds: . aspirin  324 mg Oral NOW   Or  . aspirin  300 mg Rectal NOW  . aspirin EC  81 mg Oral Daily  . citalopram  20 mg Oral Daily  . clopidogrel  75 mg Oral Daily  . famotidine  40 mg Oral BID  . furosemide  20 mg Oral Daily  . heparin  5,000 Units Subcutaneous Q8H  . irbesartan  300 mg Oral Daily  . isosorbide mononitrate  60 mg Oral Daily  . modafinil  100 mg Oral BID WC  . rosuvastatin  5 mg Oral q1800  . sodium chloride flush  3 mL Intravenous Q12H   Continuous Infusions: . sodium chloride     PRN Meds: sodium chloride, acetaminophen, ALPRAZolam, nitroGLYCERIN, ondansetron (ZOFRAN) IV, sodium chloride flush   Vital Signs    Vitals:   03/02/17 2145 03/02/17 2215 03/02/17 2300 03/03/17 0400  BP: 132/73 (!) 133/92 140/82 135/71  Pulse: 66 66 73 (!) 58  Resp: 12 17 14 18   Temp:   97.6 F (36.4 C) 97.4 F (36.3 C)  TempSrc:    Oral  SpO2: 96% 98% 98% 95%  Weight:    199 lb 4.7 oz (90.4 kg)  Height:       No intake or output data in the 24 hours ending 03/03/17 0827 Filed Weights   03/02/17 1829 03/03/17 0400  Weight: 199 lb (90.3 kg) 199 lb 4.7 oz (90.4 kg)    Physical Exam    GEN: Well nourished, well developed, in no acute distress.  HEENT: Grossly normal.  Neck: Supple, no JVD, carotid bruits, or masses. Cardiac: RRR, no murmurs, rubs, or gallops. No clubbing, cyanosis, edema.  Radials/DP/PT 2+ and equal bilaterally.  Respiratory:  Respirations regular and unlabored, clear to auscultation bilaterally. GI: Soft, nontender, nondistended, BS + x 4. MS: no deformity or  atrophy. Skin: warm and dry, no rash. Neuro:  Strength and sensation are intact. Psych: AAOx3.  Normal affect.  Labs    CBC  Recent Labs  03/02/17 1835 03/03/17 0450  WBC 6.2 5.6  HGB 15.4 15.0  HCT 43.7 43.7  MCV 86.0 86.4  PLT 114* 010*   Basic Metabolic Panel  Recent Labs  03/02/17 1835 03/03/17 0450  NA 137 140  K 4.5 4.1  CL 104 105  CO2 24 30  GLUCOSE 117* 86  BUN 31* 28*  CREATININE 1.24 1.39*  CALCIUM 9.2 9.2   Liver Function Tests No results for input(s): AST, ALT, ALKPHOS, BILITOT, PROT, ALBUMIN in the last 72 hours. No results for input(s): LIPASE, AMYLASE in the last 72 hours. Cardiac Enzymes  Recent Labs  03/02/17 2318 03/03/17 0450  TROPONINI <0.03 <0.03   BNP Invalid input(s): POCBNP D-Dimer No results for input(s): DDIMER in the last 72 hours. Hemoglobin A1C No results for input(s): HGBA1C in the last 72 hours. Fasting Lipid Panel  Recent Labs  03/03/17 0450  CHOL 118  HDL 34*  LDLCALC 64  TRIG 98  CHOLHDL 3.5   Thyroid Function Tests No results for input(s): TSH, T4TOTAL,  T3FREE, THYROIDAB in the last 72 hours.  Invalid input(s): FREET3  Telemetry    NSR - Personally Reviewed  ECG    NSR with RBBB - Personally Reviewed  Radiology    No results found.  Cardiac Studies   none  Patient Profile     76yo male with history of CAD with PCI to RCA.  In Dec he had chest pain and abnormal nuc and underwent cardiac cath 10/2017.  This revealed widely patent RCA stent and otherwise minimal CAD EF 50-55%. Normal LVEDP.   In March he was seen in ER for chest pain and neg troponin. On follow up no EKG changes felt to be atypical and pain had resolved. Comes in now with increasing history of nitroglycerin use although history is somewhat atypical.  Of note the patient's wife left him this past summer and there are frequent notes in the chart about emotional stress related to this and I wonder whether this event has had anything to  do with the frequency of symptoms that he has been having.  Assessment & Plan    1.  Chest pain with atypical features and EKG with chronic RBBB and no changes from prior EKG.  Troponin is negative x 3.  Cath 4 months ago with patent RCA and otherwise minimal CAD.  This was done for similar pain.   - Will check 2D echo and if LVF is normal then will discharge home with followup with Dr. Ellyn Hack as outpt.   - continue ASA/Plavix/ARB and statin. - Increase Imdur to 90mg  daily - no BB due to history of bradycardia  2.  ASCAD s/p PCI of the RCA - continue ASA/Plavix and statin  3.  HTN - BP controlled currently - continue ARB  4.  Hyperlipidemia with LDL goal < 70.  (64 on labs this admission) -  Continue on statin  5.  CKD stage 3 - creatinine slightly up  6.  Type 2 DM  - check HbA1C    Signed, Fransico Him, MD  03/03/2017, 8:27 AM

## 2017-03-03 NOTE — Care Management Obs Status (Signed)
[place] NOTIFICATION   Patient Details  Name: Johnny Kane MRN: [id number] Date of Birth: Jan 18, 1941   Medicare Observation Status Notification Given:  Yes    Erenest Rasher, RN 03/03/2017, 11:16 AM

## 2017-03-03 NOTE — Progress Notes (Signed)
  Echocardiogram 2D Echocardiogram has been performed.  Tresa Res 03/03/2017, 9:41 AM

## 2017-03-03 NOTE — Care Management Note (Signed)
Case Management Note  Patient Details  Name: Johnny Kane MRN: [id number] Date of Birth: 1941-05-19  Subjective/Objective:   Chest pain, HTN                 Action/Plan: Discharge Planning: NCM spoke to pt and lives at home with friend. States he was independent prior to hospital stay. Waiting final recommendations for home. Has cane, and RW at home if needed.   PCP CAMPBELL, Estill Bamberg MD  Expected Discharge Date:                  Expected Discharge Plan:  Home/Self Care  In-House Referral:  NA  Discharge planning Services  CM Consult  Post Acute Care Choice:  NA Choice offered to:  NA  DME Arranged:  N/A DME Agency:  NA  HH Arranged:  NA HH Agency:  NA  Status of Service:  Completed, signed off  If discussed at [place] of Stay Meetings, dates discussed:    Additional Comments:  Erenest Rasher, RN 03/03/2017, 11:14 AM

## 2017-03-03 NOTE — Discharge Summary (Signed)
Discharge Summary    Patient ID: Johnny Kane,  MRN: [id number], DOB/AGE: [birth year] 76 y.o.  Admit date: 03/02/2017 Discharge date: 03/03/2017  Primary Care Provider: Jenean Lindau D. Primary Cardiologist: Dr. Ellyn Hack   Discharge Diagnoses    Principal Problem:   Chest pain Active Problems:   Essential hypertension   Hyperlipidemia with target LDL less than 70   CAD S/P percutaneous coronary angioplasty - RCA DES 11/25/13   Allergies Allergies  Allergen Reactions  . Ciprofloxacin Hives and Itching  . Esomeprazole Magnesium Other (See Comments)    Pt does not recall a reaction to Nexium  . Penicillins Swelling    Hands and facial swelling   Has patient had a PCN reaction causing immediate rash, facial/tongue/throat swelling, SOB or lightheadedness with hypotension: no Has patient had a PCN reaction causing severe rash involving mucus membranes or skin necrosis: {no Has patient had a PCN reaction that required hospitalization {no Has patient had a PCN reaction occurring within the last 10 years: {no If all of the above answers are "NO", then may proceed with Cephalosporin use.  . Sulfa Drugs Cross Reactors Other (See Comments)    Unknown childhood allergic reaction  . Soma [Carisoprodol] Rash    Looks like measles    Diagnostic Studies/Procedures    Cardiac Panel (last 3 results)  Recent Labs  03/02/17 2318 03/03/17 0450 03/03/17 1025  TROPONINI <0.03 <0.03 <0.03   2D echo 03/03/17 Study Conclusions  - Left ventricle: The cavity size was normal. Wall thickness was   increased in a pattern of mild LVH. Systolic function was normal.   The estimated ejection fraction was in the range of 50% to 55%.   Wall motion was normal; there were no regional wall motion   abnormalities. Doppler parameters are consistent with abnormal   left ventricular relaxation (grade 1 diastolic dysfunction).   Doppler parameters are consistent with high ventricular filling  pressure. - Aortic valve: There was trivial regurgitation. - Aortic root: The aortic root was mildly dilated. - Mitral valve: Calcified annulus.  Impressions:  - Normal LV systolic function; mild LVH; grade 1 diastolic   dysfunction with elevated LV filling pressure; calcified aortic   valve with trace AI; mildly dilated aortic root.   History of Present Illness     76 y/o male with hx of CAD with PCI to RCA.  In Dec of last year, he had chest pain and abnormal nuc and underwent cardiac cath 10/2017.  This revealed widely patent RCA stent and otherwise minimal CAD and normal, EF 50-55%. Normal LVEDP.   In March, he was seen in ER for chest pain and neg troponin. On follow up, no EKG changes. His CP was felt to be atypical and pain had resolved.    He presented back to [facility] on 03/02/17 with recurrent CP and SOB, relieved with SL NTG initially,however had recurrence prompting him to go to the ED. On arrival, EKG showed SR with RBBB with no acute changes from previous. Initial POC troponin was negative. Given his history and symptoms, he was admitted for overnight observation and rule-out.   Hospital Course    Pt was admitted to telemetry. His CP resolved spontaneously. Cardiac enzymes were cycled and negative x 3. His CP was felt to have atypical features and his EKG showed no changes. His cath 4 months ago showed patent RCA and otherwise minimal CAD. This was done for similar pain. 2D echo was checked. This was a normal  study. EF was 50-55% with normal wall motion and grade 1 DD. Trival AI noted with mildly dilated aortic root. These findings were reviewed by Dr. Radford Pax. There was no indication for repeat ischemic testing. She recommended continued medical therapy and further titration of Imdur to 90 mg daily + continuation of ASA, Plavix, ARB and statin. No BB given h/o bradycardia. Dr.Turner felt he was sble for discharge home. He will f/u with Dr. Ellyn Hack.    Discharge Vitals Blood pressure  124/79, pulse (!) 58, temperature 98.2 F (36.8 C), temperature source Oral, resp. rate 18, height 5\' 11"  (1.803 m), weight 199 lb 4.7 oz (90.4 kg), SpO2 94 %.  Filed Weights   03/02/17 1829 03/03/17 0400  Weight: 199 lb (90.3 kg) 199 lb 4.7 oz (90.4 kg)    Labs & Radiologic Studies    CBC  Recent Labs  03/02/17 1835 03/03/17 0450  WBC 6.2 5.6  HGB 15.4 15.0  HCT 43.7 43.7  MCV 86.0 86.4  PLT 114* 235*   Basic Metabolic Panel  Recent Labs  03/02/17 1835 03/03/17 0450  NA 137 140  K 4.5 4.1  CL 104 105  CO2 24 30  GLUCOSE 117* 86  BUN 31* 28*  CREATININE 1.24 1.39*  CALCIUM 9.2 9.2   Liver Function Tests No results for input(s): AST, ALT, ALKPHOS, BILITOT, PROT, ALBUMIN in the last 72 hours. No results for input(s): LIPASE, AMYLASE in the last 72 hours. Cardiac Enzymes  Recent Labs  03/02/17 2318 03/03/17 0450 03/03/17 1025  TROPONINI <0.03 <0.03 <0.03   BNP Invalid input(s): POCBNP D-Dimer No results for input(s): DDIMER in the last 72 hours. Hemoglobin A1C No results for input(s): HGBA1C in the last 72 hours. Fasting Lipid Panel  Recent Labs  03/03/17 0450  CHOL 118  HDL 34*  LDLCALC 64  TRIG 98  CHOLHDL 3.5   Thyroid Function Tests No results for input(s): TSH, T4TOTAL, T3FREE, THYROIDAB in the last 72 hours.  Invalid input(s): FREET3 _____________  No results found. Disposition   Pt is being discharged home today in good condition.  Follow-up Plans & Appointments    Follow-up Information    Glenetta Hew, MD Follow up.   Specialty:  Cardiology Why:  our office will call you with a hospital follow-up appointment  Contact information: [address] [place] [id number] [phone]          Discharge Instructions    Diet - low sodium heart healthy    Complete by:  As directed    Increase activity slowly    Complete by:  As directed       Discharge Medications   Current Discharge Medication List      CONTINUE these medications which have CHANGED   Details  isosorbide mononitrate (IMDUR) 60 MG 24 hr tablet Take 1.5 tablets (90 mg total) by mouth at bedtime. Qty: 45 tablet, Refills: 5      CONTINUE these medications which have NOT CHANGED   Details  acetaminophen (TYLENOL) 325 MG tablet Take 2 tablets (650 mg total) by mouth every 4 (four) hours as needed for headache or mild pain.    aspirin EC 81 MG tablet Take 81-324 mg by mouth See admin instructions. 1 tablet (81 mg) once at 1500 03/02/17 and 4 tablets (324 mg) once at 1700 03/02/17    citalopram (CELEXA) 40 MG tablet Take 40 mg by mouth daily after lunch.     clindamycin (CLEOCIN) 150 MG capsule Take 600  mg by mouth See admin instructions. Take 4 capsules (600 mg) by mouth one hour prior to dental procedure    clopidogrel (PLAVIX) 75 MG tablet TAKE 1 TABLET BY MOUTH EVERY DAY Qty: 90 tablet, Refills: 3    Cyanocobalamin (VITAMIN B-12) 5000 MCG TBDP Take 5,000 mcg by mouth daily after lunch.    famotidine (PEPCID) 20 MG tablet Take 20 mg by mouth See admin instructions. Take 1 tablet (20 mg) by mouth twice daily - after lunch and at bedtime Refills: 3    ferrous sulfate 325 (65 FE) MG EC tablet Take 325 mg by mouth daily after lunch.     furosemide (LASIX) 20 MG tablet TAKE 1 TABLET (20 MG TOTAL) BY MOUTH DAILY. Qty: 30 tablet, Refills: 5    Guaifenesin (MUCINEX MAXIMUM STRENGTH) 1200 MG TB12 Take 1,200 mg by mouth daily as needed (congestion).    modafinil (PROVIGIL) 200 MG tablet Take 200 mg by mouth See admin instructions. Take 1 tablet (200 mg) by mouth twice daily - every morning and at 4:30pm    nitroGLYCERIN (NITROSTAT) 0.4 MG SL tablet PLACE 1 TABLET (0.4 MG TOTAL) UNDER THE TONGUE EVERY 5 (FIVE) MINUTES AS NEEDED FOR CHEST PAIN. Qty: 25 tablet, Refills: 2    olmesartan (BENICAR) 40 MG tablet Take 1 tablet (40 mg total) by mouth daily. Qty: 90 tablet, Refills: 3    polyethylene glycol (MIRALAX / GLYCOLAX) packet  Take 17 g by mouth daily as needed (constipation). Mix in 8 oz liquid and drink    rosuvastatin (CRESTOR) 5 MG tablet Take 5 mg by mouth at bedtime.     testosterone cypionate (DEPOTESTOTERONE CYPIONATE) 100 MG/ML injection Inject 50 mg into the muscle every 14 (fourteen) days. For IM use only - injected at [facility] in [place] - last injection approx 1st week of April         Aspirin prescribed at discharge?  Yes High Intensity Statin Prescribed? (Lipitor 40-80mg  or Crestor 20-40mg ): Yes Beta Blocker Prescribed? No: h/o bradycardia For EF <40%, was ACEI/ARB Prescribed? Yes ADP Receptor Inhibitor Prescribed? (i.e. Plavix etc.-Includes Medically Managed Patients): Yes For EF <40%, Aldosterone Inhibitor Prescribed? No: EF >40% Was EF assessed during THIS hospitalization? Yes Was Cardiac Rehab II ordered? (Included Medically managed Patients): Yes   Outstanding Labs/Studies   None   Duration of Discharge Encounter   Greater than 30 minutes including physician time.  Signed, Lyda Jester PA-C 03/03/2017, 2:41 PM

## 2017-03-03 NOTE — Plan of Care (Signed)
Problem: Health Behavior/Discharge Planning: Goal: Ability to manage health-related needs will improve Outcome: Adequate for Discharge Patient being discharged per MD order. All discharge instructions given to patient in written format.

## 2017-03-03 NOTE — Plan of Care (Signed)
Problem: Safety: Goal: Ability to remain free from injury will improve Outcome: Progressing Fall risk bundle in place. No injuries this shift. Will continue to monitor.     

## 2017-03-10 ENCOUNTER — Other Ambulatory Visit: Payer: Self-pay | Admitting: Cardiology

## 2017-03-10 NOTE — Telephone Encounter (Signed)
Rx(s) sent to pharmacy electronically.  

## 2017-04-17 DIAGNOSIS — E349 Endocrine disorder, unspecified: Secondary | ICD-10-CM | POA: Diagnosis not present

## 2017-04-20 DIAGNOSIS — L255 Unspecified contact dermatitis due to plants, except food: Secondary | ICD-10-CM | POA: Diagnosis not present

## 2017-05-01 DIAGNOSIS — E349 Endocrine disorder, unspecified: Secondary | ICD-10-CM | POA: Diagnosis not present

## 2017-05-15 DIAGNOSIS — E291 Testicular hypofunction: Secondary | ICD-10-CM | POA: Diagnosis not present

## 2017-05-21 ENCOUNTER — Ambulatory Visit: Payer: Medicare Other | Admitting: Cardiology

## 2017-05-29 DIAGNOSIS — E349 Endocrine disorder, unspecified: Secondary | ICD-10-CM | POA: Diagnosis not present

## 2017-06-09 DIAGNOSIS — M25551 Pain in right hip: Secondary | ICD-10-CM | POA: Diagnosis not present

## 2017-06-12 DIAGNOSIS — E291 Testicular hypofunction: Secondary | ICD-10-CM | POA: Diagnosis not present

## 2017-06-26 DIAGNOSIS — E291 Testicular hypofunction: Secondary | ICD-10-CM | POA: Diagnosis not present

## 2017-07-10 DIAGNOSIS — E349 Endocrine disorder, unspecified: Secondary | ICD-10-CM | POA: Diagnosis not present

## 2017-07-20 DIAGNOSIS — E782 Mixed hyperlipidemia: Secondary | ICD-10-CM | POA: Diagnosis not present

## 2017-07-20 DIAGNOSIS — R7303 Prediabetes: Secondary | ICD-10-CM | POA: Diagnosis not present

## 2017-07-20 DIAGNOSIS — N183 Chronic kidney disease, stage 3 (moderate): Secondary | ICD-10-CM | POA: Diagnosis not present

## 2017-07-20 DIAGNOSIS — E349 Endocrine disorder, unspecified: Secondary | ICD-10-CM | POA: Diagnosis not present

## 2017-07-23 DIAGNOSIS — I10 Essential (primary) hypertension: Secondary | ICD-10-CM | POA: Diagnosis not present

## 2017-07-23 DIAGNOSIS — K219 Gastro-esophageal reflux disease without esophagitis: Secondary | ICD-10-CM | POA: Diagnosis not present

## 2017-07-23 DIAGNOSIS — E349 Endocrine disorder, unspecified: Secondary | ICD-10-CM | POA: Diagnosis not present

## 2017-07-23 DIAGNOSIS — E782 Mixed hyperlipidemia: Secondary | ICD-10-CM | POA: Diagnosis not present

## 2017-08-03 ENCOUNTER — Ambulatory Visit (INDEPENDENT_AMBULATORY_CARE_PROVIDER_SITE_OTHER): Payer: Medicare Other | Admitting: Cardiology

## 2017-08-03 ENCOUNTER — Encounter: Payer: Self-pay | Admitting: Cardiology

## 2017-08-03 VITALS — BP 120/70 | HR 76 | Ht 71.0 in | Wt 205.8 lb

## 2017-08-03 DIAGNOSIS — I2510 Atherosclerotic heart disease of native coronary artery without angina pectoris: Secondary | ICD-10-CM | POA: Diagnosis not present

## 2017-08-03 DIAGNOSIS — F329 Major depressive disorder, single episode, unspecified: Secondary | ICD-10-CM | POA: Diagnosis not present

## 2017-08-03 DIAGNOSIS — F32A Depression, unspecified: Secondary | ICD-10-CM

## 2017-08-03 DIAGNOSIS — E785 Hyperlipidemia, unspecified: Secondary | ICD-10-CM | POA: Diagnosis not present

## 2017-08-03 DIAGNOSIS — I10 Essential (primary) hypertension: Secondary | ICD-10-CM | POA: Diagnosis not present

## 2017-08-03 DIAGNOSIS — Z9861 Coronary angioplasty status: Secondary | ICD-10-CM

## 2017-08-03 DIAGNOSIS — R5383 Other fatigue: Secondary | ICD-10-CM

## 2017-08-03 NOTE — Assessment & Plan Note (Addendum)
He had a cath done last year with widely patent stent. No other significant disease. He still has these we are intermittent episodes of discomfort that seemed to be not related to exertion. I think a lot of that are anxiety related. The only issue is that they are relieved with nitroglycerin. I want keep him on his Imdur as long as he is not trying to use Viagra. We had a long talk with how to handle that. He can use Viagra, he will probably be better off reducing his ARB dose that day as well.  Plan: Continue ARB and Imdur. If symptoms persist, may need to consider something like amlodipine for possible spasm. Continue Plavix plus or minus aspirin (in the past had mentioned he can stop it, but he continues to take it. He sees be tolerating low-dose statin.

## 2017-08-03 NOTE — Assessment & Plan Note (Signed)
Borderline pressures in the past. Good today on Benicar. However if he is having erectile dysfunction issues and somewhat slight dizziness, we may want to intermittently back off on Benicar. He had self adjusted his dose to half a pill, but his PCP on 40 mg. We'll need to just follow the trend.

## 2017-08-03 NOTE — Assessment & Plan Note (Signed)
This is a chronic issue for him. He is not on any medications that would explain it. I think is probably related to psychosocial issues. Continue to recommend staying active.

## 2017-08-03 NOTE — Progress Notes (Signed)
PCP: Helen Hashimoto., MD  Clinic Note: Chief Complaint  Patient presents with  . Follow-up    multiple complaints  . Coronary Artery Disease    HPI: Johnny Kane is a 76 y.o. male with a PMH below who presents today for Six-month follow-up for CAD, hypertension and hyperlipidemia.  Johnny Kane was last seen on March 9 by Bernerd Pho, PA. He was then admitted to the hospital one month later  Recent Hospitalizations:4/30-05/11/2016  Studies Personally Reviewed - (if available, images/films reviewed: From Epic Chart or Care Everywhere)  2D Echo - mild LVH. EF 50-55%. GR 1 DD. Elevated LVEDP. Trace AI. Mild aortic root dilatation.  Cardiac Cath December 2017: Patent RCA stent with a lead normal coronary arteries. Normal EF 55%.  Interval History: Johnny Kane returns today as usual full of his symptoms & explanations.   He indicates that he is still having some of the chest discomfort episodes, but not as much as before. He tells me however that he can barely walk from one in the distorted 100 and the store without getting dyspneic. No chest tightness or pressure in that situation. The timing, duration, and extent of chest discomfort varies without any true correlation.  In addition to the exertional dyspnea that is basically baseline, he says that he has at least 1-2 episodes of chest discomfort per week for which she takes nitroglycerin. This is notably an improvement.  He also notes a strange sensation of dizziness and lightheadedness if he overdoes it with exertion - he may note a little dizziness and a little near-syncope, but no true syncope. He denies any nausea or vomiting, but simply has lost his appetite. He's lost desire to eat, but has to force himself in order to maintain weight.  He denies any PND, orthopnea or edema. Even during the dizzy episode she denies any rapid irregular heartbeat/palpitations.  No TIA/amaurosis fugax symptoms. No melena, hematochezia,  hematuria, or epstaxis. No claudication.  ROS: A comprehensive was performed. Review of Systems  Constitutional: Positive for malaise/fatigue (Doing better as his social situation improves - better on Provigil).  HENT: Negative for congestion.   Respiratory: Positive for shortness of breath. Negative for cough and wheezing.   Cardiovascular:       Per history of present illness  Gastrointestinal: Negative for blood in stool and melena.  Genitourinary: Negative for hematuria.  Musculoskeletal: Positive for joint pain (Right knee) and myalgias (Right thigh).  Neurological: Negative for dizziness.  Endo/Heme/Allergies: Negative for environmental allergies. Bruises/bleeds easily.  Psychiatric/Behavioral: Positive for depression (Symptoms seem to be better.). The patient is not nervous/anxious and does not have insomnia.   All other systems reviewed and are negative.   I have reviewed and (if needed) personally updated the patient's problem list, medications, allergies, past medical and surgical history, social and family history.   Past Medical History:  Diagnosis Date  . Aortic calcification (HCC)    No evidence of AAA on CT Abd in 2013  . Aortic valve sclerosis    a. 12/2013 Echo: Aortic Sclerosis; EF 60-65%, Gr1 DD, mild AI, mildly dil LA.  Marland Kitchen CAD S/P percutaneous coronary angioplasty    a. 07/2011 Nl MV;  b. 11/2013 Cath: LM nl, LAD 20p, D1/2 nl, LCX 30p, OM1 40, OM2 nl, RCA 90-45m (3.5x18 Xience DES). c. Myoview 04/2014: No ischmia/Infarction, normal EF. d. patent stent by cath in 10/2016.  Marland Kitchen Depression   . Diabetes mellitus without complication ([place])   . Diverticulosis of colon   .  GERD (gastroesophageal reflux disease)   . Hiatal hernia   . History of MRSA infection   . Hyperlipidemia   . Hypertension   . Low testosterone   . OA (osteoarthritis)   . Palpitations   . Palpitations   . Sleep apnea    a. mild not on cpap   . Vasovagal episode   . Vitamin B 12 deficiency      Past Surgical History:  Procedure Laterality Date  . CARDIAC CATHETERIZATION N/A 10/23/2016   Procedure: Left Heart Cath and Coronary Angiography;  Surgeon: Leonie Man, MD;  Location: [facility];  Service: Cardiovascular;  Laterality: N/A;  . CHOLECYSTECTOMY N/A 07/20/2013   Procedure: LAPAROSCOPIC CHOLECYSTECTOMY;  Surgeon: Joyice Faster. Cornett, MD;  Location: [facility];  Service: General;  Laterality: N/A;  . CORONARY ANGIOPLASTY  11/25/2013   DES   RCA    . LEFT HEART CATHETERIZATION WITH CORONARY ANGIOGRAM N/A 11/25/2013   Procedure: LEFT HEART CATHETERIZATION WITH CORONARY ANGIOGRAM;  Surgeon: Leonie Man, MD;  Location: [facility];  Service: Cardiovascular;  Laterality: N/A;  . REPLACEMENT TOTAL KNEE     one on each knee  . TONSILLECTOMY    . TRANSTHORACIC ECHOCARDIOGRAM  February 2015   EF 60-65%, Gr1DD, mild AI with aortic sclerosis, mildly dilated LA.    Myoview 10/15/2016: No ischemia or infarction noted, but EF estimated 44% with diffuse hypokinesis. As a result he was referred for cardiac catheterization.  Cardiac catheterization 10/23/2016: Widely patent RCA stent and otherwise minimal CAD. EF 50-55%. Normal LVEDP \ ] Current Meds  Medication Sig  . acetaminophen (TYLENOL) 325 MG tablet Take 2 tablets (650 mg total) by mouth every 4 (four) hours as needed for headache or mild pain. (Patient taking differently: Take 325 mg by mouth daily as needed (pain). )  . aspirin EC 81 MG tablet Take 81-324 mg by mouth as needed.   . clindamycin (CLEOCIN) 150 MG capsule Take 600 mg by mouth See admin instructions. Take 4 capsules (600 mg) by mouth one hour prior to dental procedure  . clopidogrel (PLAVIX) 75 MG tablet TAKE 1 TABLET BY MOUTH EVERY DAY (Patient taking differently: TAKE 1 TABLET BY MOUTH EVERY DAY AFTER LUNCH)  . Cyanocobalamin (VITAMIN B-12) 5000 MCG TBDP Take 5,000 mcg by mouth daily after lunch.  . escitalopram (LEXAPRO) 20 MG tablet Take 20 mg by mouth  daily.  . famotidine (PEPCID) 20 MG tablet Take 20 mg by mouth See admin instructions. Take 1 tablet (20 mg) by mouth twice daily - after lunch and at bedtime  . ferrous sulfate 325 (65 FE) MG EC tablet Take 325 mg by mouth daily after lunch.   . furosemide (LASIX) 20 MG tablet TAKE 1 TABLET (20 MG TOTAL) BY MOUTH DAILY.  Marland Kitchen Guaifenesin (MUCINEX MAXIMUM STRENGTH) 1200 MG TB12 Take 1,200 mg by mouth daily as needed (congestion).  . isosorbide mononitrate (IMDUR) 60 MG 24 hr tablet Take 1.5 tablets (90 mg total) by mouth at bedtime.  . modafinil (PROVIGIL) 200 MG tablet Take 200 mg by mouth See admin instructions. Take 1 tablet (200 mg) by mouth twice daily - every morning and at 4:30pm  . nitroGLYCERIN (NITROSTAT) 0.4 MG SL tablet PLACE 1 TABLET (0.4 MG TOTAL) UNDER THE TONGUE EVERY 5 (FIVE) MINUTES AS NEEDED FOR CHEST PAIN.  Marland Kitchen olmesartan (BENICAR) 40 MG tablet Take 1 tablet (40 mg total) by mouth daily. (Patient taking differently: Take 40 mg by mouth daily after lunch. )  .  polyethylene glycol (MIRALAX / GLYCOLAX) packet Take 17 g by mouth daily as needed (constipation). Mix in 8 oz liquid and drink  . rosuvastatin (CRESTOR) 5 MG tablet Take 5 mg by mouth at bedtime.   Marland Kitchen testosterone cypionate (DEPOTESTOTERONE CYPIONATE) 100 MG/ML injection Inject 50 mg into the muscle every 14 (fourteen) days. For IM use only - injected at [facility] in [place] - last injection approx 1st week of April    Allergies  Allergen Reactions  . Ciprofloxacin Hives and Itching  . Esomeprazole Magnesium Other (See Comments)    Pt does not recall a reaction to Nexium  . Penicillins Swelling    Hands and facial swelling   Has patient had a PCN reaction causing immediate rash, facial/tongue/throat swelling, SOB or lightheadedness with hypotension: no Has patient had a PCN reaction causing severe rash involving mucus membranes or skin necrosis: {no Has patient had a PCN reaction that required hospitalization  {no Has patient had a PCN reaction occurring within the last 10 years: {no If all of the above answers are "NO", then may proceed with Cephalosporin use.  Manuela Neptune [Carisoprodol] Rash    Looks like measles  . Sulfa Drugs Cross Reactors Other (See Comments)    Unknown childhood allergic reaction    Social History   Social History  . Marital status: Legally Separated    Spouse name: N/A  . Number of children: 2  . Years of education: N/A   Occupational History  . [employer]     retired   Social History Main Topics  . Smoking status: Former Smoker    Years: 11.00    Quit date: 11/04/1967  . Smokeless tobacco: Never Used  . Alcohol use No  . Drug use: No  . Sexual activity: Not Asked   Other Topics Concern  . None   Social History Narrative   Married father of 2. Retired from [employer].   Former smoker - quit in 1969 after 11 years; denies alcohol consumption  -- He is accompanied today by a much younger woman to whom he intends to marry once his divorce is finalized.  She has a young ~8 y/l child, and he wants to step in as daddy.  He thinks that once completed, it will go a long ways to reduce the stresses.  family history includes Asthma in his mother; Heart attack in his father; Hypertension in his mother.  Wt Readings from Last 3 Encounters:  08/03/17 205 lb 12.8 oz (93.4 kg)  03/03/17 199 lb 4.7 oz (90.4 kg)  01/09/17 207 lb 12.8 oz (94.3 kg)  From Jan 2018 - 213 Lb  PHYSICAL EXAM BP 120/70   Pulse 76   Ht 5\' 11"  (1.803 m)   Wt 205 lb 12.8 oz (93.4 kg)   BMI 28.70 kg/m  Physical Exam  Constitutional: He is oriented to person, place, and time. He appears well-developed and well-nourished. No distress.  HENT:  Head: Normocephalic and atraumatic.  He wears his glasses down with the nose  Eyes: EOM are normal.  Neck: Normal range of motion. Neck supple. No hepatojugular reflux and no JVD present. Carotid bruit is not present.  Cardiovascular: Normal rate, regular rhythm,  normal heart sounds, intact distal pulses and normal pulses.   Occasional extrasystoles are present. PMI is not displaced.  Exam reveals no gallop and no friction rub.   No murmur heard. Pulmonary/Chest: Effort normal and breath sounds normal. No respiratory distress. He has no wheezes. He has no  rales.  Abdominal: Soft. Bowel sounds are normal. He exhibits no distension. There is no tenderness. There is no rebound.  Musculoskeletal: Normal range of motion. He exhibits no edema.  Neurological: He is alert and oriented to person, place, and time.  Skin: Skin is warm and dry. No erythema.  Psychiatric: He has a normal mood and affect. Thought content normal.  Very odd affect still. He is very much a agenda based - he likes to get his point across regardless of whether MSK question of something else. He announces to me his relationship with this woman in the room with as a potential fianc.  Nursing note and vitals reviewed.    Adult ECG Report Not checked   Other studies Reviewed: Additional studies/ records that were reviewed today include:  Recent Labs:    Lab Results  Component Value Date   CHOL 118 03/03/2017   HDL 34 (L) 03/03/2017   LDLCALC 64 03/03/2017   TRIG 98 03/03/2017   CHOLHDL 3.5 03/03/2017   s ASSESSMENT / PLAN:  Problem List Items Addressed This Visit    CAD S/P percutaneous coronary angioplasty - RCA DES 11/25/13 - Primary (Chronic)    He had a cath done last year with widely patent stent. No other significant disease. He still has these we are intermittent episodes of discomfort that seemed to be not related to exertion. I think a lot of that are anxiety related. The only issue is that they are relieved with nitroglycerin. I want keep him on his Imdur as long as he is not trying to use Viagra. We had a long talk with how to handle that. He can use Viagra, he will probably be better off reducing his ARB dose that day as well.  Plan: Continue ARB and Imdur. If symptoms  persist, may need to consider something like amlodipine for possible spasm. Continue Plavix plus or minus aspirin (in the past had mentioned he can stop it, but he continues to take it. He sees be tolerating low-dose statin.      Essential hypertension (Chronic)    Borderline pressures in the past. Good today on Benicar. However if he is having erectile dysfunction issues and somewhat slight dizziness, we may want to intermittently back off on Benicar. He had self adjusted his dose to half a pill, but his PCP on 40 mg. We'll need to just follow the trend.      Fatigue (Chronic)    This is a chronic issue for him. He is not on any medications that would explain it. I think is probably related to psychosocial issues. Continue to recommend staying active.      Hyperlipidemia with target LDL less than 70 (Chronic)    He is on low-dose statin. Labs are being followed by PCP. Seems to be tolerating okay.         Current medicines are reviewed at length with the patient today. (+/- concerns) n/a The following changes have been made: n/a  Patient Instructions  No change with current treatment  Or medication    Your physician wants you to follow-up in 6 months with DR Jimi Schappert. You will receive a reminder letter in the mail two months in advance. If you don't receive a letter, please call our office to schedule the follow-up appointment.   If you need a refill on your cardiac medications before your next appointment, please call your pharmacy.    Studies Ordered:   No orders of the defined types were  placed in this encounter.     Glenetta Hew, M.D., M.S. Interventional Cardiologist   Pager # [phone] Phone # [phone] [address] [place], [place] [address]

## 2017-08-03 NOTE — Assessment & Plan Note (Signed)
He is on low-dose statin. Labs are being followed by PCP. Seems to be tolerating okay.

## 2017-08-03 NOTE — Patient Instructions (Signed)
No change with current treatment  Or medication    Your physician wants you to follow-up in 6 months with DR [name]. You will receive a reminder letter in the mail two months in advance. If you don't receive a letter, please call our office to schedule the follow-up appointment.   If you need a refill on your cardiac medications before your next appointment, please call your pharmacy.

## 2017-08-07 DIAGNOSIS — E349 Endocrine disorder, unspecified: Secondary | ICD-10-CM | POA: Diagnosis not present

## 2017-08-13 ENCOUNTER — Other Ambulatory Visit: Payer: Self-pay | Admitting: Cardiology

## 2017-08-24 DIAGNOSIS — E349 Endocrine disorder, unspecified: Secondary | ICD-10-CM | POA: Diagnosis not present

## 2017-09-07 DIAGNOSIS — E349 Endocrine disorder, unspecified: Secondary | ICD-10-CM | POA: Diagnosis not present

## 2017-09-22 DIAGNOSIS — E349 Endocrine disorder, unspecified: Secondary | ICD-10-CM | POA: Diagnosis not present

## 2017-09-23 DIAGNOSIS — D485 Neoplasm of uncertain behavior of skin: Secondary | ICD-10-CM | POA: Diagnosis not present

## 2017-09-23 DIAGNOSIS — D1801 Hemangioma of skin and subcutaneous tissue: Secondary | ICD-10-CM | POA: Diagnosis not present

## 2017-09-23 DIAGNOSIS — L570 Actinic keratosis: Secondary | ICD-10-CM | POA: Diagnosis not present

## 2017-09-23 DIAGNOSIS — L821 Other seborrheic keratosis: Secondary | ICD-10-CM | POA: Diagnosis not present

## 2017-09-23 DIAGNOSIS — L814 Other melanin hyperpigmentation: Secondary | ICD-10-CM | POA: Diagnosis not present

## 2017-09-23 DIAGNOSIS — Z85828 Personal history of other malignant neoplasm of skin: Secondary | ICD-10-CM | POA: Diagnosis not present

## 2017-10-01 DIAGNOSIS — Z96643 Presence of artificial hip joint, bilateral: Secondary | ICD-10-CM | POA: Diagnosis not present

## 2017-10-01 DIAGNOSIS — S83411A Sprain of medial collateral ligament of right knee, initial encounter: Secondary | ICD-10-CM | POA: Diagnosis not present

## 2017-10-06 DIAGNOSIS — E349 Endocrine disorder, unspecified: Secondary | ICD-10-CM | POA: Diagnosis not present

## 2017-10-08 ENCOUNTER — Other Ambulatory Visit: Payer: Self-pay | Admitting: Cardiology

## 2017-10-19 DIAGNOSIS — E349 Endocrine disorder, unspecified: Secondary | ICD-10-CM | POA: Diagnosis not present

## 2017-10-22 DIAGNOSIS — S83411D Sprain of medial collateral ligament of right knee, subsequent encounter: Secondary | ICD-10-CM | POA: Diagnosis not present

## 2017-11-02 DIAGNOSIS — E349 Endocrine disorder, unspecified: Secondary | ICD-10-CM | POA: Diagnosis not present

## 2017-11-05 DIAGNOSIS — S83411D Sprain of medial collateral ligament of right knee, subsequent encounter: Secondary | ICD-10-CM | POA: Diagnosis not present

## 2017-11-15 ENCOUNTER — Other Ambulatory Visit: Payer: Self-pay | Admitting: Cardiology

## 2017-11-16 ENCOUNTER — Other Ambulatory Visit: Payer: Self-pay

## 2017-11-16 DIAGNOSIS — E349 Endocrine disorder, unspecified: Secondary | ICD-10-CM | POA: Diagnosis not present

## 2017-11-26 DIAGNOSIS — S83411D Sprain of medial collateral ligament of right knee, subsequent encounter: Secondary | ICD-10-CM | POA: Diagnosis not present

## 2017-11-30 ENCOUNTER — Other Ambulatory Visit: Payer: Self-pay | Admitting: Cardiology

## 2017-11-30 DIAGNOSIS — E349 Endocrine disorder, unspecified: Secondary | ICD-10-CM | POA: Diagnosis not present

## 2017-11-30 NOTE — Telephone Encounter (Signed)
Rx request sent to pharmacy.  

## 2017-12-04 ENCOUNTER — Telehealth: Payer: Self-pay | Admitting: Cardiology

## 2017-12-04 NOTE — Telephone Encounter (Signed)
Johnny Kane is calling because he is feeling bad , confused and slightly confused . This has been going on for a while now , his heartbeat is erratic and he has taken a  Nitro , Just feels really bad . Wants to know should he come in . Please call

## 2017-12-04 NOTE — Telephone Encounter (Signed)
Returned call to patient of Dr. Ellyn Hack regarding his "feeling bad" and "confusion". He states he is not greatly concerned about these symptoms but felt he should let us know.   He reports he rarely has chest pain He has SOB, he gets in to the situation where he realizes he is "barely breathing" He is using more than 1 NTG a day and this "straightens things out" (SOB) He has had these symptoms for a few months He bought a stethoscope and listens to his heart  Advised patient that he should be evaluated in the office for recurrent symptoms given CAD history. He is scheduled for Feb 4 @ 3pm with Dr. Ellyn Hack. Advised on NTG use. Advised to seek ED eval for chest pain/SOB episodes lasting more than 30 minutes or symptoms that worsen in severity or frequency.   Routed to MD/RN as Juluis Rainier

## 2017-12-06 NOTE — Telephone Encounter (Signed)
Great! DH 

## 2017-12-07 ENCOUNTER — Ambulatory Visit: Payer: Medicare Other | Admitting: Cardiology

## 2017-12-07 ENCOUNTER — Encounter: Payer: Self-pay | Admitting: Cardiology

## 2017-12-07 ENCOUNTER — Other Ambulatory Visit: Payer: Self-pay | Admitting: Cardiology

## 2017-12-07 VITALS — BP 118/70 | HR 83 | Ht 71.0 in | Wt 217.4 lb

## 2017-12-07 DIAGNOSIS — I10 Essential (primary) hypertension: Secondary | ICD-10-CM | POA: Diagnosis not present

## 2017-12-07 DIAGNOSIS — I493 Ventricular premature depolarization: Secondary | ICD-10-CM | POA: Diagnosis not present

## 2017-12-07 DIAGNOSIS — R9439 Abnormal result of other cardiovascular function study: Secondary | ICD-10-CM

## 2017-12-07 DIAGNOSIS — E785 Hyperlipidemia, unspecified: Secondary | ICD-10-CM

## 2017-12-07 DIAGNOSIS — R002 Palpitations: Secondary | ICD-10-CM | POA: Diagnosis not present

## 2017-12-07 DIAGNOSIS — R079 Chest pain, unspecified: Secondary | ICD-10-CM | POA: Diagnosis not present

## 2017-12-07 DIAGNOSIS — I2510 Atherosclerotic heart disease of native coronary artery without angina pectoris: Secondary | ICD-10-CM | POA: Diagnosis not present

## 2017-12-07 DIAGNOSIS — Z9861 Coronary angioplasty status: Secondary | ICD-10-CM | POA: Diagnosis not present

## 2017-12-07 DIAGNOSIS — R413 Other amnesia: Secondary | ICD-10-CM | POA: Diagnosis not present

## 2017-12-07 MED ORDER — ISOSORBIDE MONONITRATE ER 60 MG PO TB24: 60.0000 mg | ORAL_TABLET | Freq: Two times a day (BID) | ORAL | 3 refills | Status: DC

## 2017-12-07 NOTE — Telephone Encounter (Signed)
Rx request sent to pharmacy.  

## 2017-12-07 NOTE — Patient Instructions (Signed)
MEDICATION CHANGES   INCREASE ISOSORBIDE MONONITRATE 60 MG  ONE TABLET TWICE A DAY.     Your physician wants you to follow-up in [place]. You will receive a reminder letter in the mail two months in advance. If you don't receive a letter, please call our office to schedule the follow-up appointment.
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
## 2017-12-09 ENCOUNTER — Encounter: Payer: Self-pay | Admitting: Cardiology

## 2017-12-09 NOTE — Assessment & Plan Note (Signed)
He is having these palpitations, but I am reluctant again to use a beta-blocker.  Since they seem to be relieved with nitroglycerin, we will simply increase his Imdur dose.

## 2017-12-09 NOTE — Assessment & Plan Note (Signed)
This is definitely becoming an issue.  I think is probably worse than we are truly understanding.  He was probably relatively high functioning when he was younger and that allows him to get away with what is probably worth to mention them air given credit for. Probably would be beneficial to air more on the side of cautious conservative management as opposed to any aggressive therapy.

## 2017-12-09 NOTE — Assessment & Plan Note (Signed)
Still have this weird symptoms that do not really sound like angina, however I just do not know if he remembers is angina.  Plan for now is to simply treat by increasing his nitrate.  Would need to have more convincing symptoms and possibly even positive troponin levels to consider relook invasive evaluation.

## 2017-12-09 NOTE — Assessment & Plan Note (Signed)
For this very reason, I would be reluctant to order another nuclear stress test on him unless I am convinced of his symptoms.  Would simply just treat medically for now.

## 2017-12-09 NOTE — Assessment & Plan Note (Signed)
He seems to be tolerating low-dose statin.  Labs been followed by PCP.  Not available

## 2017-12-09 NOTE — Assessment & Plan Note (Signed)
Very unusual symptoms currently, but relieved with nitroglycerin.  I am not convinced enough to pursue another ischemic evaluation.  Last time we did a Myoview that was somewhat abnormal we could do a cath and he had patent coronaries.  Plan: Since he has weird symptoms alleviated with nitroglycerin, we will simply increase his Imdur to 60 mg twice daily. -->  Would prefer to use Imdur as opposed to amlodipine to avoid hypotension  Continue Plavix without aspirin (although he is taking it as needed),  Continue low-dose Crestor along with Benicar.  Not on beta-blocker because of fatigue issues.

## 2017-12-09 NOTE — Assessment & Plan Note (Signed)
Tolerating low-dose Benicar.  No near syncopal episodes to suggest hypotension.

## 2017-12-14 DIAGNOSIS — E349 Endocrine disorder, unspecified: Secondary | ICD-10-CM | POA: Diagnosis not present

## 2017-12-17 DIAGNOSIS — S83411D Sprain of medial collateral ligament of right knee, subsequent encounter: Secondary | ICD-10-CM | POA: Diagnosis not present

## 2017-12-22 ENCOUNTER — Other Ambulatory Visit: Payer: Self-pay

## 2017-12-22 MED ORDER — NITROGLYCERIN 0.4 MG SL SUBL: 0.4000 mg | SUBLINGUAL_TABLET | SUBLINGUAL | 2 refills | Status: DC | PRN

## 2017-12-28 DIAGNOSIS — E349 Endocrine disorder, unspecified: Secondary | ICD-10-CM | POA: Diagnosis not present

## 2018-01-09 IMAGING — CT CT CTA ABD/PEL W/CM AND/OR W/O CM
3 of 9 series · 11 of 46 positions shown, 17 images · IV contrast (APPLIED)
Comparison: CT of the abdomen and pelvis on 09/06/2010. CTA of the
chest on 12/16/2011 and CT of the chest on 09/06/2010.

CLINICAL DATA: History of possible abdominal aortic aneurysm.

EXAM:
CTA ABDOMEN AND PELVIS WITHOUT CONTRAST
TECHNIQUE: Multidetector CT imaging of the abdomen and pelvis was performed
using the standard protocol during bolus administration of
intravenous contrast. Multiplanar reconstructed images and MIPs were
obtained and reviewed to evaluate the vascular anatomy.
CONTRAST:  85mL OMNIPAQUE IOHEXOL 350 MG/ML SOLN

[Series 6: arterial · axial · arterial · 0.75mm/px · z∈[-799,-753]mm · 2 of 230 slices shown]
[im 23/230  soft-tissue]
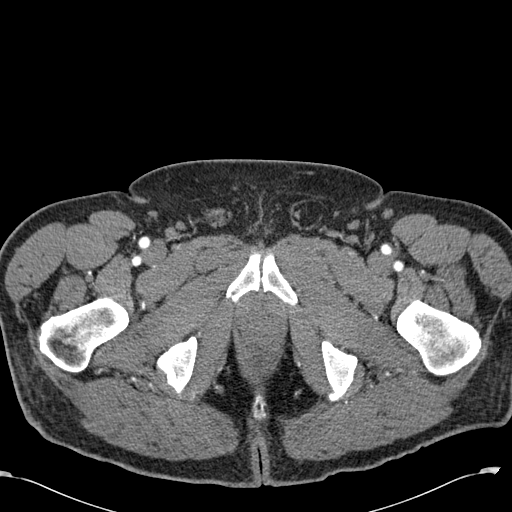
[im 46/230  soft-tissue]
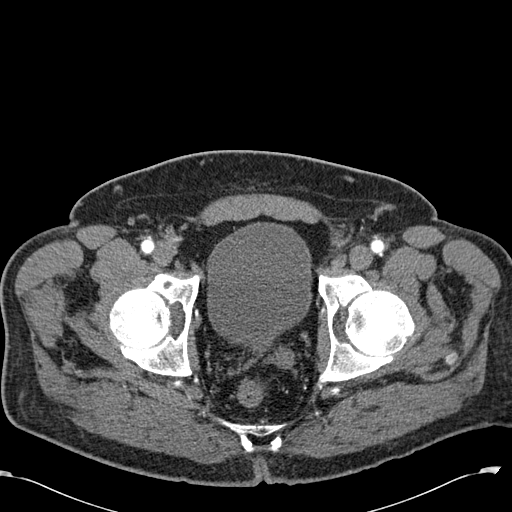

[Series 8: venous · axial · portal-venous · 0.75mm/px · z∈[-785,-445]mm · 7 of 92 slices shown, 12 images]
[im 12/92  soft-tissue]
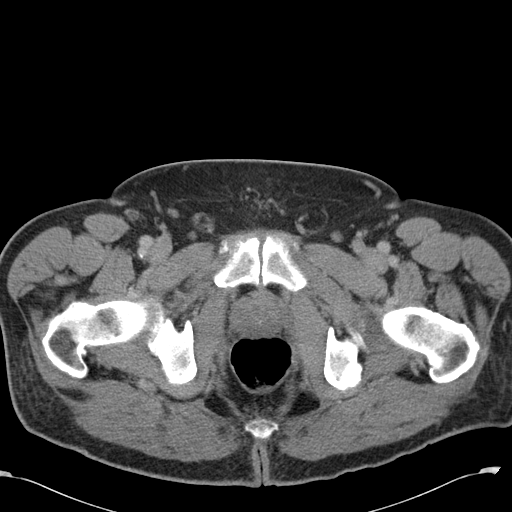
[im 12/92  bone]
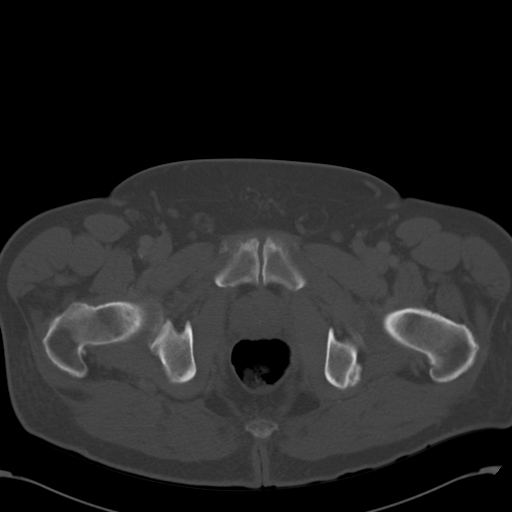
[im 23/92  soft-tissue]
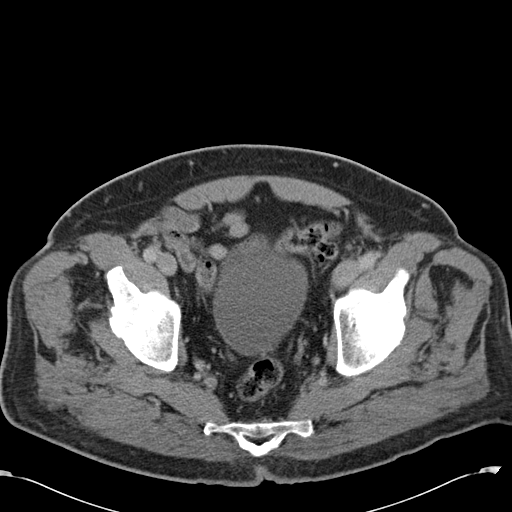
[im 35/92  soft-tissue]
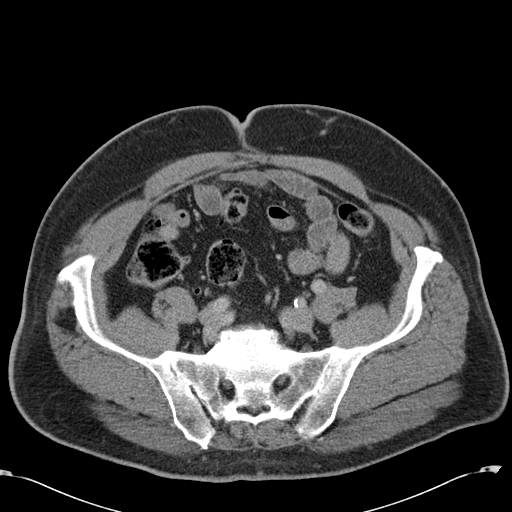
[im 46/92  soft-tissue]
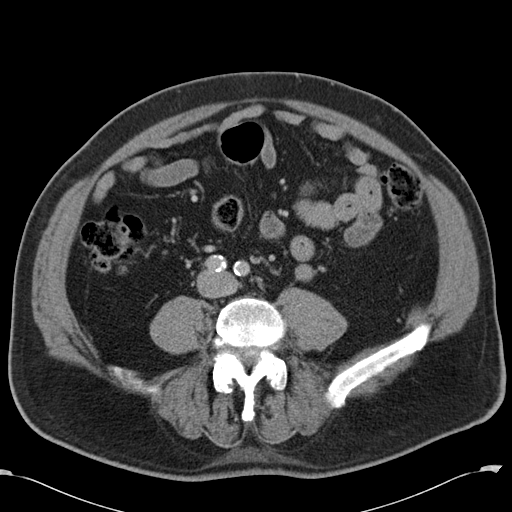
[im 46/92  lung]
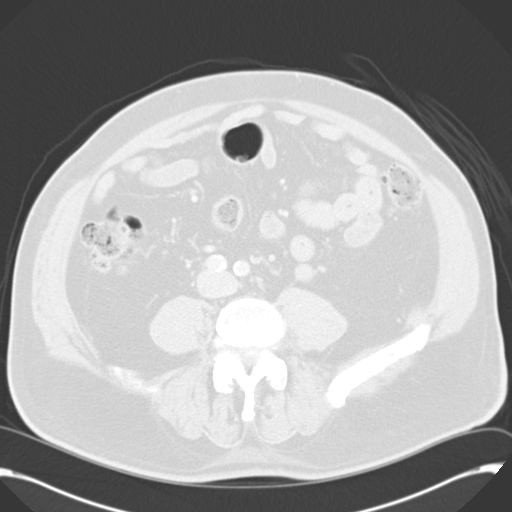
[im 57/92  soft-tissue]
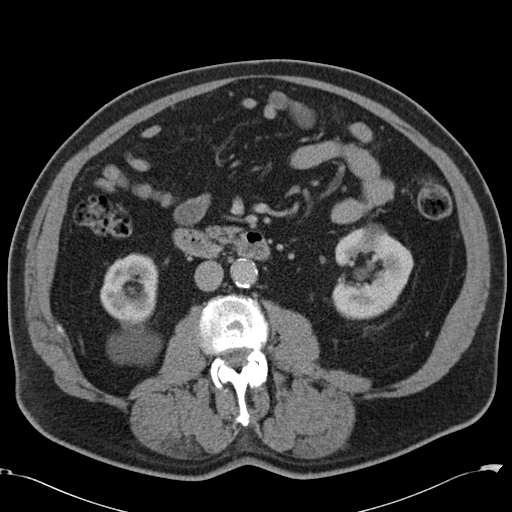
[im 57/92  lung]
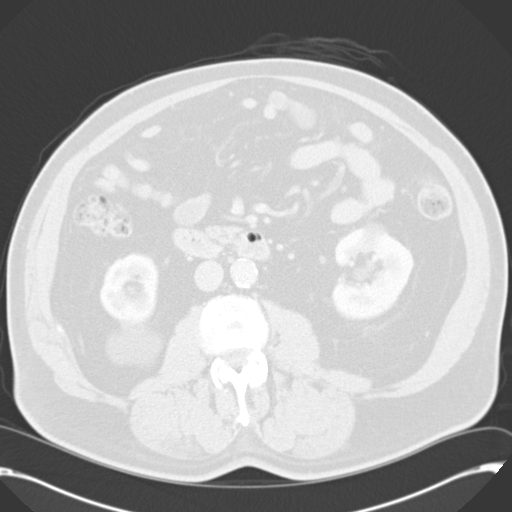
[im 69/92  soft-tissue]
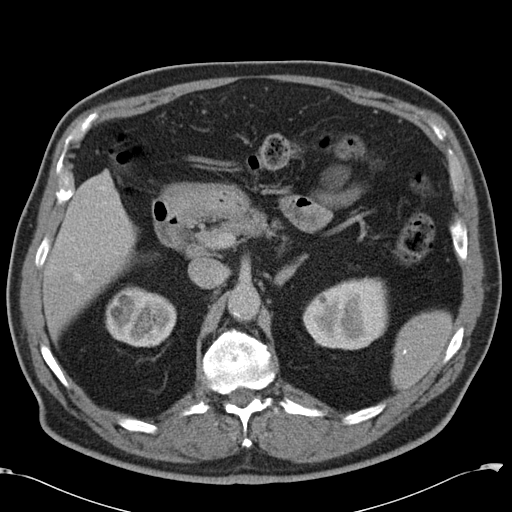
[im 69/92  lung]
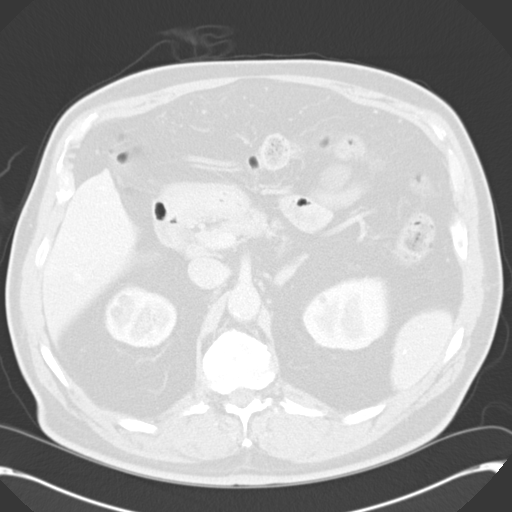
[im 80/92  soft-tissue]
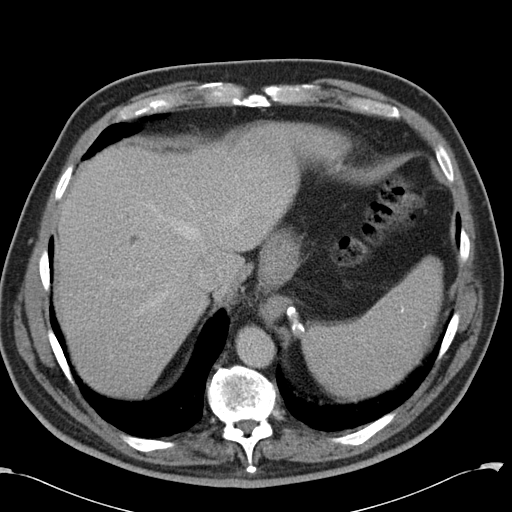
[im 80/92  lung]
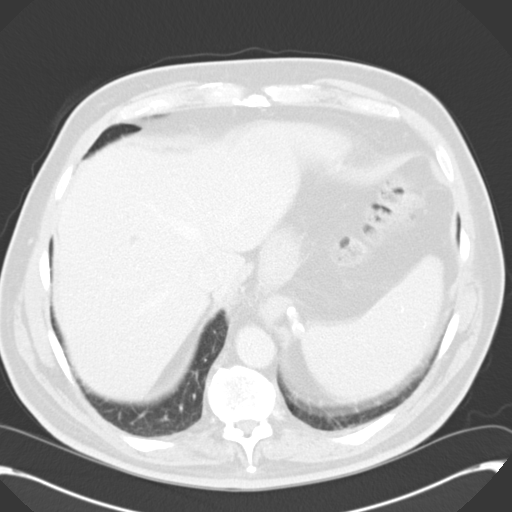

[Series 10: cor arterial mpr · coronal · arterial · 0.77mm/px · 2 of 156 slices shown, 3 images]
[im 52/156  soft-tissue]
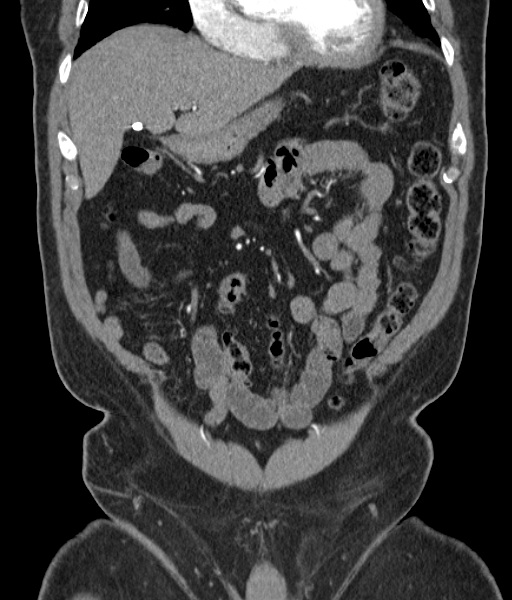
[im 52/156  bone]
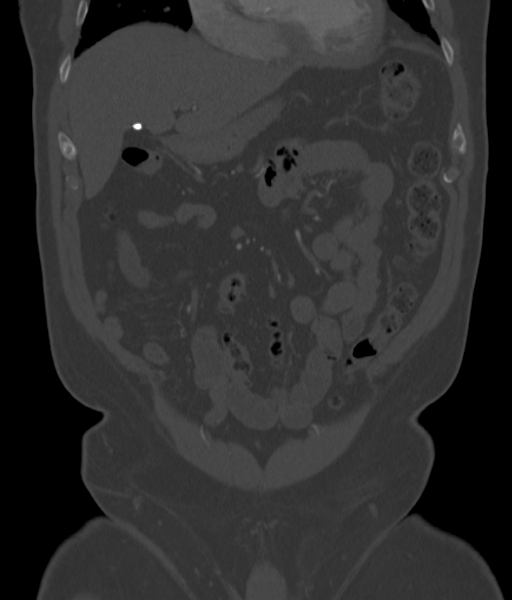
[im 104/156  soft-tissue]
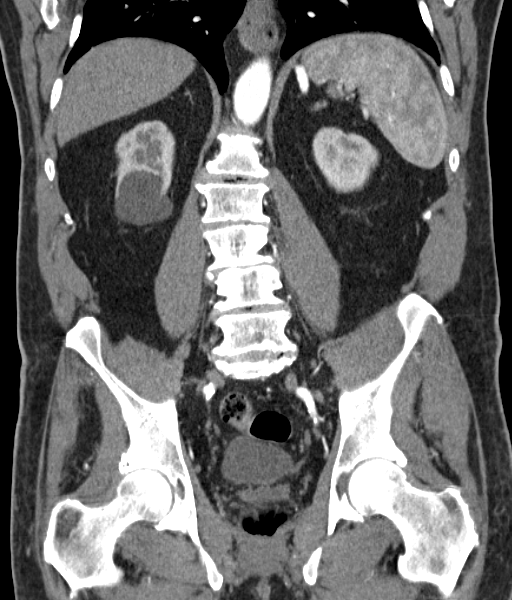

[11 of 46 positions shown; findings below may reference images not displayed]

FINDINGS: The abdominal aorta demonstrates scattered calcified plaque. The
distal aorta at the level of the inferior mesenteric artery shows
very mild bulge due to protrusion posteriorly which is not truly
aneurysmal in measurement and measures 2.5 cm in greatest diameter.
This would be classified as mild aortic ectasia. The aorta above
this level measures approximately 2 cm.

The celiac axis, superior mesenteric artery and inferior mesenteric
artery are normally patent. Single renal arteries are present
bilaterally. Calcified plaque in the proximal segments of both renal
arteries causes roughly up to approximately 40- 50% stenosis without
high-grade stenosis identified.

The iliac arteries are moderately tortuous and show no evidence of
significant obstructive disease or aneurysmal disease. The common
femoral arteries and femoral bifurcations are normally patent
bilaterally.

There is stable appearance of a moderate-sized hiatal hernia. Stable
calcified granulomata in the liver and spleen. Bilateral renal cysts
again identified which appear benign. No incidental masses or
enlarged lymph nodes. The bladder is unremarkable. No hernias are
identified. Bony structures show stable appearance of multilevel
spondylosis in the lower thoracic and lumbar spine.

Review of the MIP images confirms the above findings.
IMPRESSION: 1. No evidence of true abdominal aortic aneurysm. There is mild
bulge/ectasia of the abdominal aorta at the level of the IMA origin
measuring up to 2.5 cm in diameter compared to infrarenal diameter
of 2 cm. Ectatic abdominal aorta at risk for aneurysm development.
Recommend followup by ultrasound in 5 years. This recommendation
follows ACR consensus guidelines: White Paper of the ACR Incidental
Findings Committee II on Vascular Findings. [HOSPITAL] 2685;
[DATE].
2. Bilateral renal artery stenoses of approximately 40- 50%
narrowing.
3. Of note, the prior studies did demonstrate mild aneurysmal
dilatation of the ascending thoracic aorta up to approximately
cm on the 2685 study. Based on this diameter, annual imaging
follow-up with CTA or MRA is recommended. This recommendation
follows 0464 ACCF/AHA/AATS/ACR/ASA/SCA/CLAIRE/ZOHRA/DUART/MUSULMANKA Guidelines
for the Diagnosis and Management of Patients with Thoracic Aortic
Disease. Circulation. 0464; 121: [id number]

## 2018-01-11 DIAGNOSIS — E349 Endocrine disorder, unspecified: Secondary | ICD-10-CM | POA: Diagnosis not present

## 2018-01-20 DIAGNOSIS — E782 Mixed hyperlipidemia: Secondary | ICD-10-CM | POA: Diagnosis not present

## 2018-01-20 DIAGNOSIS — I10 Essential (primary) hypertension: Secondary | ICD-10-CM | POA: Diagnosis not present

## 2018-01-20 DIAGNOSIS — E349 Endocrine disorder, unspecified: Secondary | ICD-10-CM | POA: Diagnosis not present

## 2018-01-20 DIAGNOSIS — D751 Secondary polycythemia: Secondary | ICD-10-CM | POA: Diagnosis not present

## 2018-01-22 DIAGNOSIS — Z1331 Encounter for screening for depression: Secondary | ICD-10-CM | POA: Diagnosis not present

## 2018-01-22 DIAGNOSIS — Z139 Encounter for screening, unspecified: Secondary | ICD-10-CM | POA: Diagnosis not present

## 2018-01-22 DIAGNOSIS — E782 Mixed hyperlipidemia: Secondary | ICD-10-CM | POA: Diagnosis not present

## 2018-01-22 DIAGNOSIS — I10 Essential (primary) hypertension: Secondary | ICD-10-CM | POA: Diagnosis not present

## 2018-01-22 DIAGNOSIS — E349 Endocrine disorder, unspecified: Secondary | ICD-10-CM | POA: Diagnosis not present

## 2018-01-22 DIAGNOSIS — N183 Chronic kidney disease, stage 3 (moderate): Secondary | ICD-10-CM | POA: Diagnosis not present

## 2018-01-25 DIAGNOSIS — E349 Endocrine disorder, unspecified: Secondary | ICD-10-CM | POA: Diagnosis not present

## 2018-02-08 DIAGNOSIS — E349 Endocrine disorder, unspecified: Secondary | ICD-10-CM | POA: Diagnosis not present

## 2018-02-22 DIAGNOSIS — E349 Endocrine disorder, unspecified: Secondary | ICD-10-CM | POA: Diagnosis not present

## 2018-03-03 ENCOUNTER — Other Ambulatory Visit: Payer: Self-pay | Admitting: Cardiology

## 2018-03-03 NOTE — Telephone Encounter (Signed)
Rx sent to pharmacy   

## 2018-03-08 DIAGNOSIS — E349 Endocrine disorder, unspecified: Secondary | ICD-10-CM | POA: Diagnosis not present

## 2018-03-08 DIAGNOSIS — Z136 Encounter for screening for cardiovascular disorders: Secondary | ICD-10-CM | POA: Diagnosis not present

## 2018-03-08 DIAGNOSIS — Z139 Encounter for screening, unspecified: Secondary | ICD-10-CM | POA: Diagnosis not present

## 2018-03-08 DIAGNOSIS — Z0000 Encounter for general adult medical examination without abnormal findings: Secondary | ICD-10-CM | POA: Diagnosis not present

## 2018-03-08 DIAGNOSIS — Z9181 History of falling: Secondary | ICD-10-CM | POA: Diagnosis not present

## 2018-03-08 DIAGNOSIS — E785 Hyperlipidemia, unspecified: Secondary | ICD-10-CM | POA: Diagnosis not present

## 2018-03-19 ENCOUNTER — Other Ambulatory Visit: Payer: Self-pay | Admitting: Cardiology

## 2018-03-19 NOTE — Telephone Encounter (Signed)
Rx request sent to pharmacy.  

## 2018-03-22 DIAGNOSIS — E349 Endocrine disorder, unspecified: Secondary | ICD-10-CM | POA: Diagnosis not present

## 2018-04-05 DIAGNOSIS — E349 Endocrine disorder, unspecified: Secondary | ICD-10-CM | POA: Diagnosis not present

## 2018-04-19 DIAGNOSIS — E349 Endocrine disorder, unspecified: Secondary | ICD-10-CM | POA: Diagnosis not present

## 2018-05-04 DIAGNOSIS — E349 Endocrine disorder, unspecified: Secondary | ICD-10-CM | POA: Diagnosis not present

## 2018-05-17 DIAGNOSIS — Z1339 Encounter for screening examination for other mental health and behavioral disorders: Secondary | ICD-10-CM | POA: Diagnosis not present

## 2018-05-17 DIAGNOSIS — L600 Ingrowing nail: Secondary | ICD-10-CM | POA: Diagnosis not present

## 2018-05-18 DIAGNOSIS — E349 Endocrine disorder, unspecified: Secondary | ICD-10-CM | POA: Diagnosis not present

## 2018-05-24 ENCOUNTER — Other Ambulatory Visit: Payer: Self-pay

## 2018-05-24 MED ORDER — FUROSEMIDE 20 MG PO TABS: 20.0000 mg | ORAL_TABLET | Freq: Every day | ORAL | 1 refills | Status: DC

## 2018-05-24 MED ORDER — NITROGLYCERIN 0.4 MG SL SUBL: 0.4000 mg | SUBLINGUAL_TABLET | SUBLINGUAL | 2 refills | Status: DC | PRN

## 2018-05-24 MED ORDER — CLOPIDOGREL BISULFATE 75 MG PO TABS: 75.0000 mg | ORAL_TABLET | Freq: Every day | ORAL | 1 refills | Status: DC

## 2018-05-24 MED ORDER — ISOSORBIDE MONONITRATE ER 60 MG PO TB24: 60.0000 mg | ORAL_TABLET | Freq: Two times a day (BID) | ORAL | 2 refills | Status: DC

## 2018-05-28 ENCOUNTER — Ambulatory Visit: Payer: Medicare Other | Admitting: Cardiology

## 2018-05-28 VITALS — BP 132/74 | HR 83 | Ht 71.0 in | Wt 220.2 lb

## 2018-05-28 DIAGNOSIS — I714 Abdominal aortic aneurysm, without rupture, unspecified: Secondary | ICD-10-CM

## 2018-05-28 DIAGNOSIS — I2510 Atherosclerotic heart disease of native coronary artery without angina pectoris: Secondary | ICD-10-CM | POA: Diagnosis not present

## 2018-05-28 DIAGNOSIS — Z9861 Coronary angioplasty status: Secondary | ICD-10-CM | POA: Diagnosis not present

## 2018-05-28 DIAGNOSIS — I10 Essential (primary) hypertension: Secondary | ICD-10-CM | POA: Diagnosis not present

## 2018-05-28 DIAGNOSIS — R413 Other amnesia: Secondary | ICD-10-CM | POA: Diagnosis not present

## 2018-05-28 DIAGNOSIS — E785 Hyperlipidemia, unspecified: Secondary | ICD-10-CM

## 2018-05-28 DIAGNOSIS — E669 Obesity, unspecified: Secondary | ICD-10-CM

## 2018-05-28 DIAGNOSIS — I493 Ventricular premature depolarization: Secondary | ICD-10-CM

## 2018-05-28 DIAGNOSIS — R002 Palpitations: Secondary | ICD-10-CM

## 2018-05-28 DIAGNOSIS — R0609 Other forms of dyspnea: Secondary | ICD-10-CM

## 2018-05-28 MED ORDER — ISOSORBIDE MONONITRATE ER 60 MG PO TB24: 60.0000 mg | ORAL_TABLET | Freq: Every day | ORAL | 2 refills | Status: DC
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
## 2018-05-28 NOTE — Patient Instructions (Addendum)
Medication Instructions:  Try to decrease isosorbide (Imdur) to only 60 mg in the AM (stop taking in the PM) ---if needed ok to take increase to 120 mg but take in the AM only   Ok to use viagra but you must be off isosorbide (Imdur) for 36 hours prior  If you use viagra-wait 24 hours before taking isosorbide (Imdur)  Follow-Up: Your physician wants you to follow-up in: 6 months with Dr. Ellyn Hack. You will receive a reminder letter in the mail two months in advance. If you don't receive a letter, please call our office to schedule the follow-up appointment.   Any Other Special Instructions Will Be Listed Below (If Applicable).     If you need a refill on your cardiac medications before your next appointment, please call your pharmacy.

## 2018-05-31 ENCOUNTER — Other Ambulatory Visit: Payer: Self-pay | Admitting: Cardiology

## 2018-05-31 ENCOUNTER — Encounter: Payer: Self-pay | Admitting: Cardiology

## 2018-05-31 NOTE — Assessment & Plan Note (Signed)
Can wait til next visit & review imaging study history

## 2018-05-31 NOTE — Assessment & Plan Note (Signed)
Relatively stable.  No significant issues with(esp for current symptoms).  Glenetta Hew, MD

## 2018-05-31 NOTE — Assessment & Plan Note (Signed)
Doing well with no further symptoms.  He is on Imdur as much for neurologic issues / memory.  Most recent ischemic evaluation was Abnormal Myoview that was Falsely Positive by Cath.  PLAN:   Convert Imdur back to daily (try 60 in AM - increase to 90mg  if needed).  Continue ASA/Plavix  Not on Beta Blocker b/c fatigue  OK to stop Statin to avoid exacerbation of memory issues.  Continue Benicar

## 2018-05-31 NOTE — Assessment & Plan Note (Signed)
Stable - stays active & more limited by MSK issues.

## 2018-05-31 NOTE — Assessment & Plan Note (Signed)
Unfortunately, his weight is going up -- needs to exercise & change diet.

## 2018-05-31 NOTE — Assessment & Plan Note (Signed)
I do not have lipid panel from this year, but was well controlled last year.  At this point, would prefer to stop Statin (reassess in ~6 months) for memory issues.  Johnny Kane

## 2018-05-31 NOTE — Assessment & Plan Note (Signed)
Pretty much well controlled (occasionally only takes 1/2 dose of ARB). No syncope or near syncope.

## 2018-06-01 DIAGNOSIS — E349 Endocrine disorder, unspecified: Secondary | ICD-10-CM | POA: Diagnosis not present

## 2018-06-01 NOTE — Telephone Encounter (Signed)
Rx request sent to pharmacy.  

## 2018-06-15 DIAGNOSIS — E349 Endocrine disorder, unspecified: Secondary | ICD-10-CM | POA: Diagnosis not present

## 2018-07-07 DIAGNOSIS — E349 Endocrine disorder, unspecified: Secondary | ICD-10-CM | POA: Diagnosis not present

## 2018-07-21 DIAGNOSIS — E349 Endocrine disorder, unspecified: Secondary | ICD-10-CM | POA: Diagnosis not present

## 2018-07-26 ENCOUNTER — Encounter: Payer: Self-pay | Admitting: Cardiology

## 2018-07-26 DIAGNOSIS — I10 Essential (primary) hypertension: Secondary | ICD-10-CM | POA: Diagnosis not present

## 2018-07-26 DIAGNOSIS — D751 Secondary polycythemia: Secondary | ICD-10-CM | POA: Diagnosis not present

## 2018-07-26 DIAGNOSIS — E349 Endocrine disorder, unspecified: Secondary | ICD-10-CM | POA: Diagnosis not present

## 2018-07-26 DIAGNOSIS — E782 Mixed hyperlipidemia: Secondary | ICD-10-CM | POA: Diagnosis not present

## 2018-07-28 DIAGNOSIS — E782 Mixed hyperlipidemia: Secondary | ICD-10-CM | POA: Diagnosis not present

## 2018-07-28 DIAGNOSIS — I10 Essential (primary) hypertension: Secondary | ICD-10-CM | POA: Diagnosis not present

## 2018-07-28 DIAGNOSIS — N183 Chronic kidney disease, stage 3 (moderate): Secondary | ICD-10-CM | POA: Diagnosis not present

## 2018-07-28 DIAGNOSIS — I712 Thoracic aortic aneurysm, without rupture: Secondary | ICD-10-CM | POA: Diagnosis not present

## 2018-07-28 DIAGNOSIS — Z23 Encounter for immunization: Secondary | ICD-10-CM | POA: Diagnosis not present

## 2018-08-04 DIAGNOSIS — E349 Endocrine disorder, unspecified: Secondary | ICD-10-CM | POA: Diagnosis not present

## 2018-08-18 DIAGNOSIS — E349 Endocrine disorder, unspecified: Secondary | ICD-10-CM | POA: Diagnosis not present

## 2018-09-01 ENCOUNTER — Other Ambulatory Visit: Payer: Self-pay | Admitting: Cardiology

## 2018-09-06 DIAGNOSIS — E349 Endocrine disorder, unspecified: Secondary | ICD-10-CM | POA: Diagnosis not present

## 2018-09-08 ENCOUNTER — Ambulatory Visit: Payer: Medicare Other | Admitting: Cardiology

## 2018-09-08 ENCOUNTER — Encounter: Payer: Self-pay | Admitting: Cardiology

## 2018-09-08 VITALS — BP 152/89 | HR 67 | Ht 71.0 in | Wt 217.0 lb

## 2018-09-08 DIAGNOSIS — I2510 Atherosclerotic heart disease of native coronary artery without angina pectoris: Secondary | ICD-10-CM | POA: Diagnosis not present

## 2018-09-08 DIAGNOSIS — E785 Hyperlipidemia, unspecified: Secondary | ICD-10-CM

## 2018-09-08 DIAGNOSIS — R002 Palpitations: Secondary | ICD-10-CM

## 2018-09-08 DIAGNOSIS — Z9861 Coronary angioplasty status: Secondary | ICD-10-CM

## 2018-09-08 DIAGNOSIS — R0609 Other forms of dyspnea: Secondary | ICD-10-CM

## 2018-09-08 DIAGNOSIS — I10 Essential (primary) hypertension: Secondary | ICD-10-CM | POA: Diagnosis not present

## 2018-09-08 DIAGNOSIS — I493 Ventricular premature depolarization: Secondary | ICD-10-CM | POA: Diagnosis not present

## 2018-09-08 MED ORDER — CLOPIDOGREL BISULFATE 75 MG PO TABS
75.0000 mg | ORAL_TABLET | Freq: Every day | ORAL | 3 refills | Status: DC
Start: 2018-09-08 — End: 2019-09-22

## 2018-09-08 MED ORDER — ISOSORBIDE MONONITRATE ER 60 MG PO TB24: ORAL_TABLET | ORAL | 3 refills | Status: DC

## 2018-09-08 MED ORDER — OLMESARTAN MEDOXOMIL 40 MG PO TABS: ORAL_TABLET | ORAL | 3 refills | Status: DC

## 2018-09-08 NOTE — Assessment & Plan Note (Signed)
No sensation of any further episodes.  We have stopped his beta-blocker because of fatigue issues.  Would not restart now.

## 2018-09-08 NOTE — Assessment & Plan Note (Signed)
Doing well with no further angina symptoms.  Is on aspirin Plavix, okay to stop aspirin.  On Imdur.  Not on beta-blocker for reasons mentioned elsewhere. Probably on max tolerable dose of statin.

## 2018-09-08 NOTE — Assessment & Plan Note (Signed)
His blood pressure is high today, and I am actually okay without them okay with him having permissive hypertension.  Would prefer to have higher blood pressures and lower.  In fact I would like him to take his diltiazem 3 4 hours after being awake in order to be fully assess his blood pressures.   I will actually have him restart Imdur when he goes to bed.  This will help still be in his system when he wakes up. --He has been off it now for long enough that he may have reset his responsiveness and restarting a 60 mg may have a profound effect.

## 2018-09-08 NOTE — Patient Instructions (Addendum)
Medication Instructions:  START  ISOSORBIDE 60 MG TAKE AT BEDTIME   START TAKING BENICAR ( OLMESARTAN) 2 TO 3 HOURS AFTER YOU WAKE IN THE MORNING  OR IF YOU WAKE UP IN THE AFTERNOON.  If you need a refill on your cardiac medications before your next appointment, please call your pharmacy.   Lab work: NOT NEEDED If you have labs (blood work) drawn today and your tests are completely normal, you will receive your results only by: Marland Kitchen MyChart Message (if you have MyChart) OR . A paper copy in the mail If you have any lab test that is abnormal or we need to change your treatment, we will call you to review the results.  Testing/Procedures: NOT NEEDED  Follow-Up: At [facility], you and your health needs are our priority.  As part of our continuing mission to provide you with exceptional heart care, we have created designated Provider Care Teams.  These Care Teams include your primary Cardiologist (physician) and Advanced Practice Providers (APPs -  Physician Assistants and Nurse Practitioners) who all work together to provide you with the care you need, when you need it. You will need a follow up appointment in 12 months.  Please call our office 2 months in advance to schedule this appointment.  You may see No primary care provider on file. or one of the following Advanced Practice Providers on your designated Care Team:   Rosaria Ferries, PA-C . Jory Sims, DNP, ANP  Any Other Special Instructions Will Be Listed Below (If Applicable).
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
## 2018-09-08 NOTE — Assessment & Plan Note (Signed)
Seems to be doing okay with low-dose statin.  I am not sure how much of it affected his plan of his memory issues.  Have not seen labs since May 2018.  Would like to see labs from PCP.  Not sure when they were last checked.  In light of his now worsening dementia symptoms, I think were probably okay not being overly aggressive.

## 2018-09-08 NOTE — Assessment & Plan Note (Signed)
Actually relatively stable.  Not really noticing too much dyspnea.  He is on his standing low-dose Lasix. Restarting Imdur for potential mild ischemic symptoms.

## 2018-09-08 NOTE — Assessment & Plan Note (Signed)
Stable.  He did not mention

## 2018-09-20 DIAGNOSIS — E349 Endocrine disorder, unspecified: Secondary | ICD-10-CM | POA: Diagnosis not present

## 2018-10-04 DIAGNOSIS — E349 Endocrine disorder, unspecified: Secondary | ICD-10-CM | POA: Diagnosis not present

## 2018-10-05 DIAGNOSIS — Z1159 Encounter for screening for other viral diseases: Secondary | ICD-10-CM | POA: Diagnosis not present

## 2018-10-07 DIAGNOSIS — M25512 Pain in left shoulder: Secondary | ICD-10-CM | POA: Diagnosis not present

## 2018-10-14 DIAGNOSIS — M25512 Pain in left shoulder: Secondary | ICD-10-CM | POA: Diagnosis not present

## 2018-10-18 DIAGNOSIS — E349 Endocrine disorder, unspecified: Secondary | ICD-10-CM | POA: Diagnosis not present

## 2018-10-19 DIAGNOSIS — M7582 Other shoulder lesions, left shoulder: Secondary | ICD-10-CM | POA: Diagnosis not present

## 2018-11-01 DIAGNOSIS — E349 Endocrine disorder, unspecified: Secondary | ICD-10-CM | POA: Diagnosis not present

## 2018-11-15 DIAGNOSIS — E349 Endocrine disorder, unspecified: Secondary | ICD-10-CM | POA: Diagnosis not present

## 2018-11-22 DIAGNOSIS — M7582 Other shoulder lesions, left shoulder: Secondary | ICD-10-CM | POA: Diagnosis not present

## 2018-11-29 DIAGNOSIS — E349 Endocrine disorder, unspecified: Secondary | ICD-10-CM | POA: Diagnosis not present

## 2018-12-13 DIAGNOSIS — E349 Endocrine disorder, unspecified: Secondary | ICD-10-CM | POA: Diagnosis not present

## 2018-12-13 DIAGNOSIS — H905 Unspecified sensorineural hearing loss: Secondary | ICD-10-CM | POA: Diagnosis not present

## 2019-01-03 DIAGNOSIS — E349 Endocrine disorder, unspecified: Secondary | ICD-10-CM | POA: Diagnosis not present

## 2019-01-05 IMAGING — DX DG CHEST 2V
2 series · 2 of 2 positions shown · non-contrast
Comparison: 11/22/2013

CLINICAL DATA: Pt states severe rt chest /breast pains since last
night, did get better with Asprin and nitro, but came in to get
checked out, hx cardiac stent, htn, diabetes, non smoker

EXAM:
CHEST  2 VIEW

[chest pa]
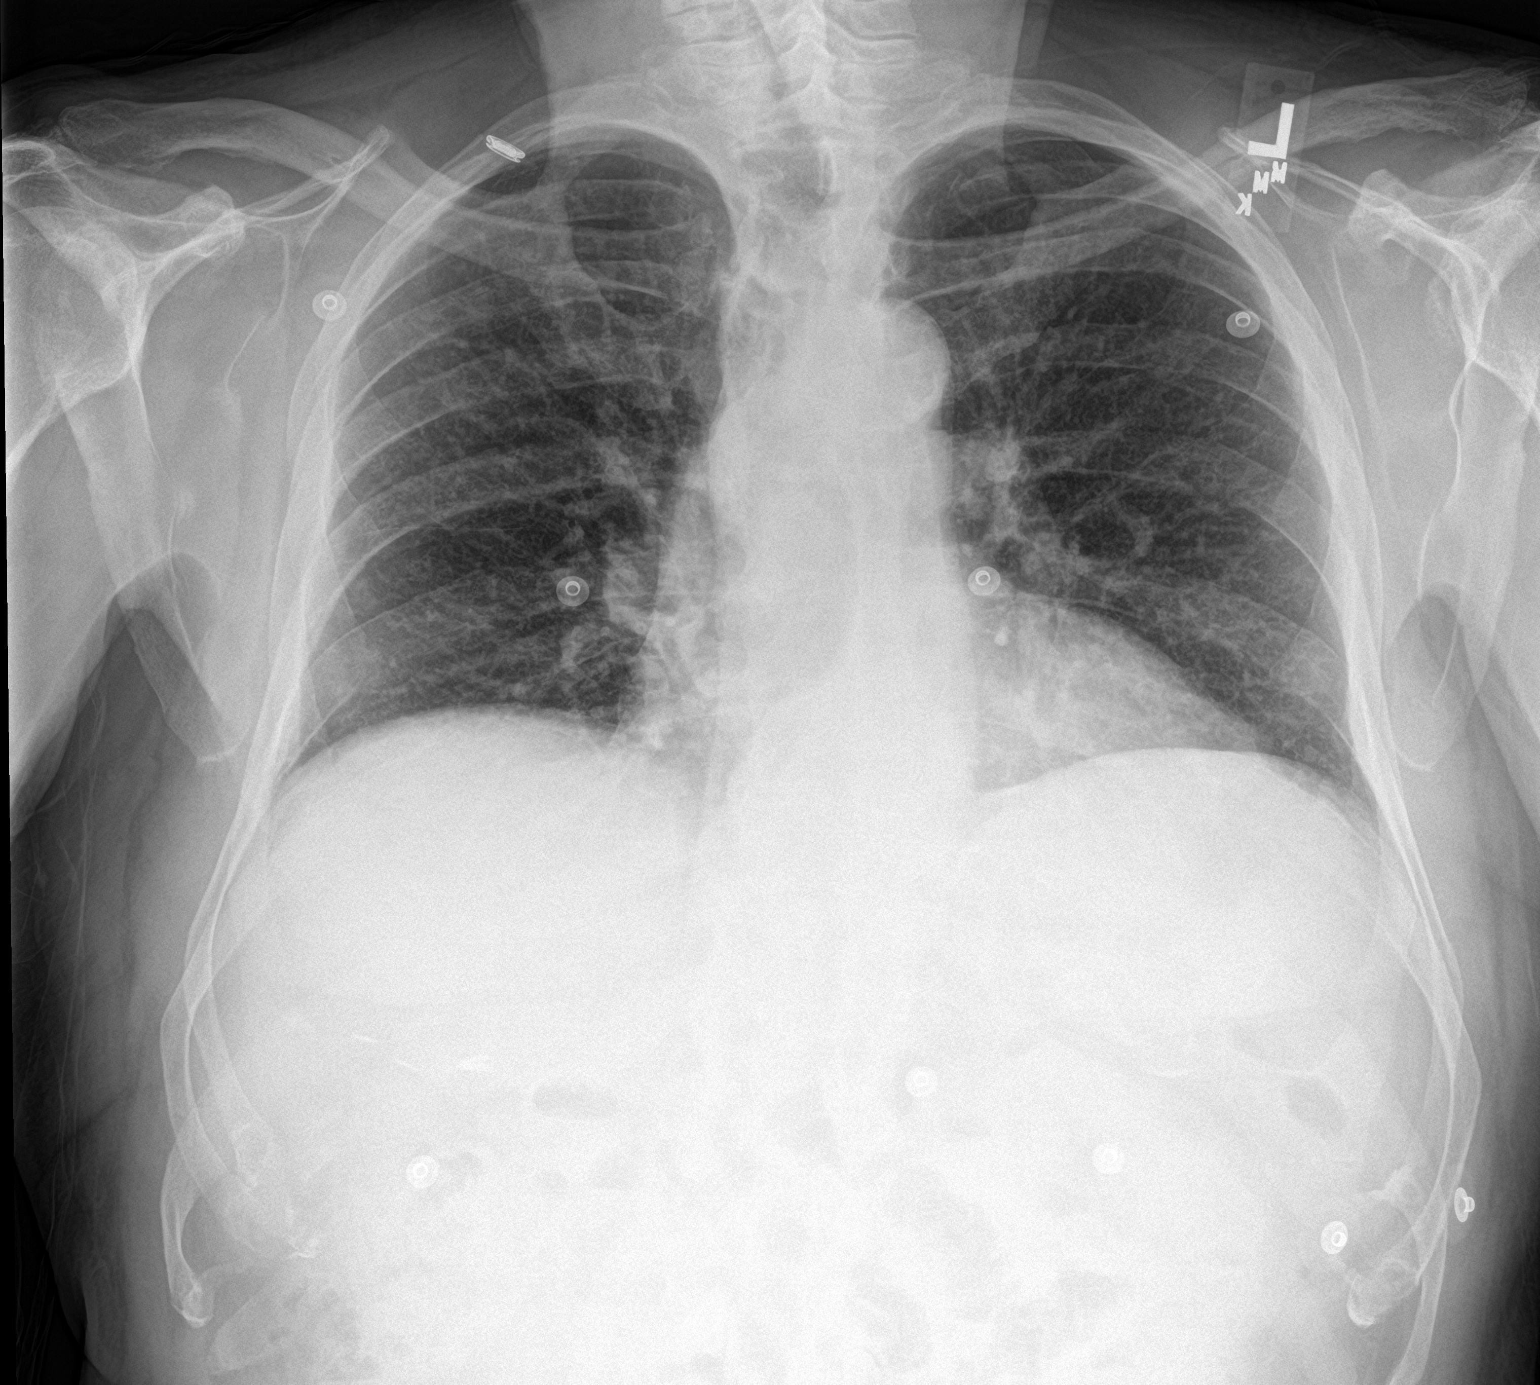

[chest lat]
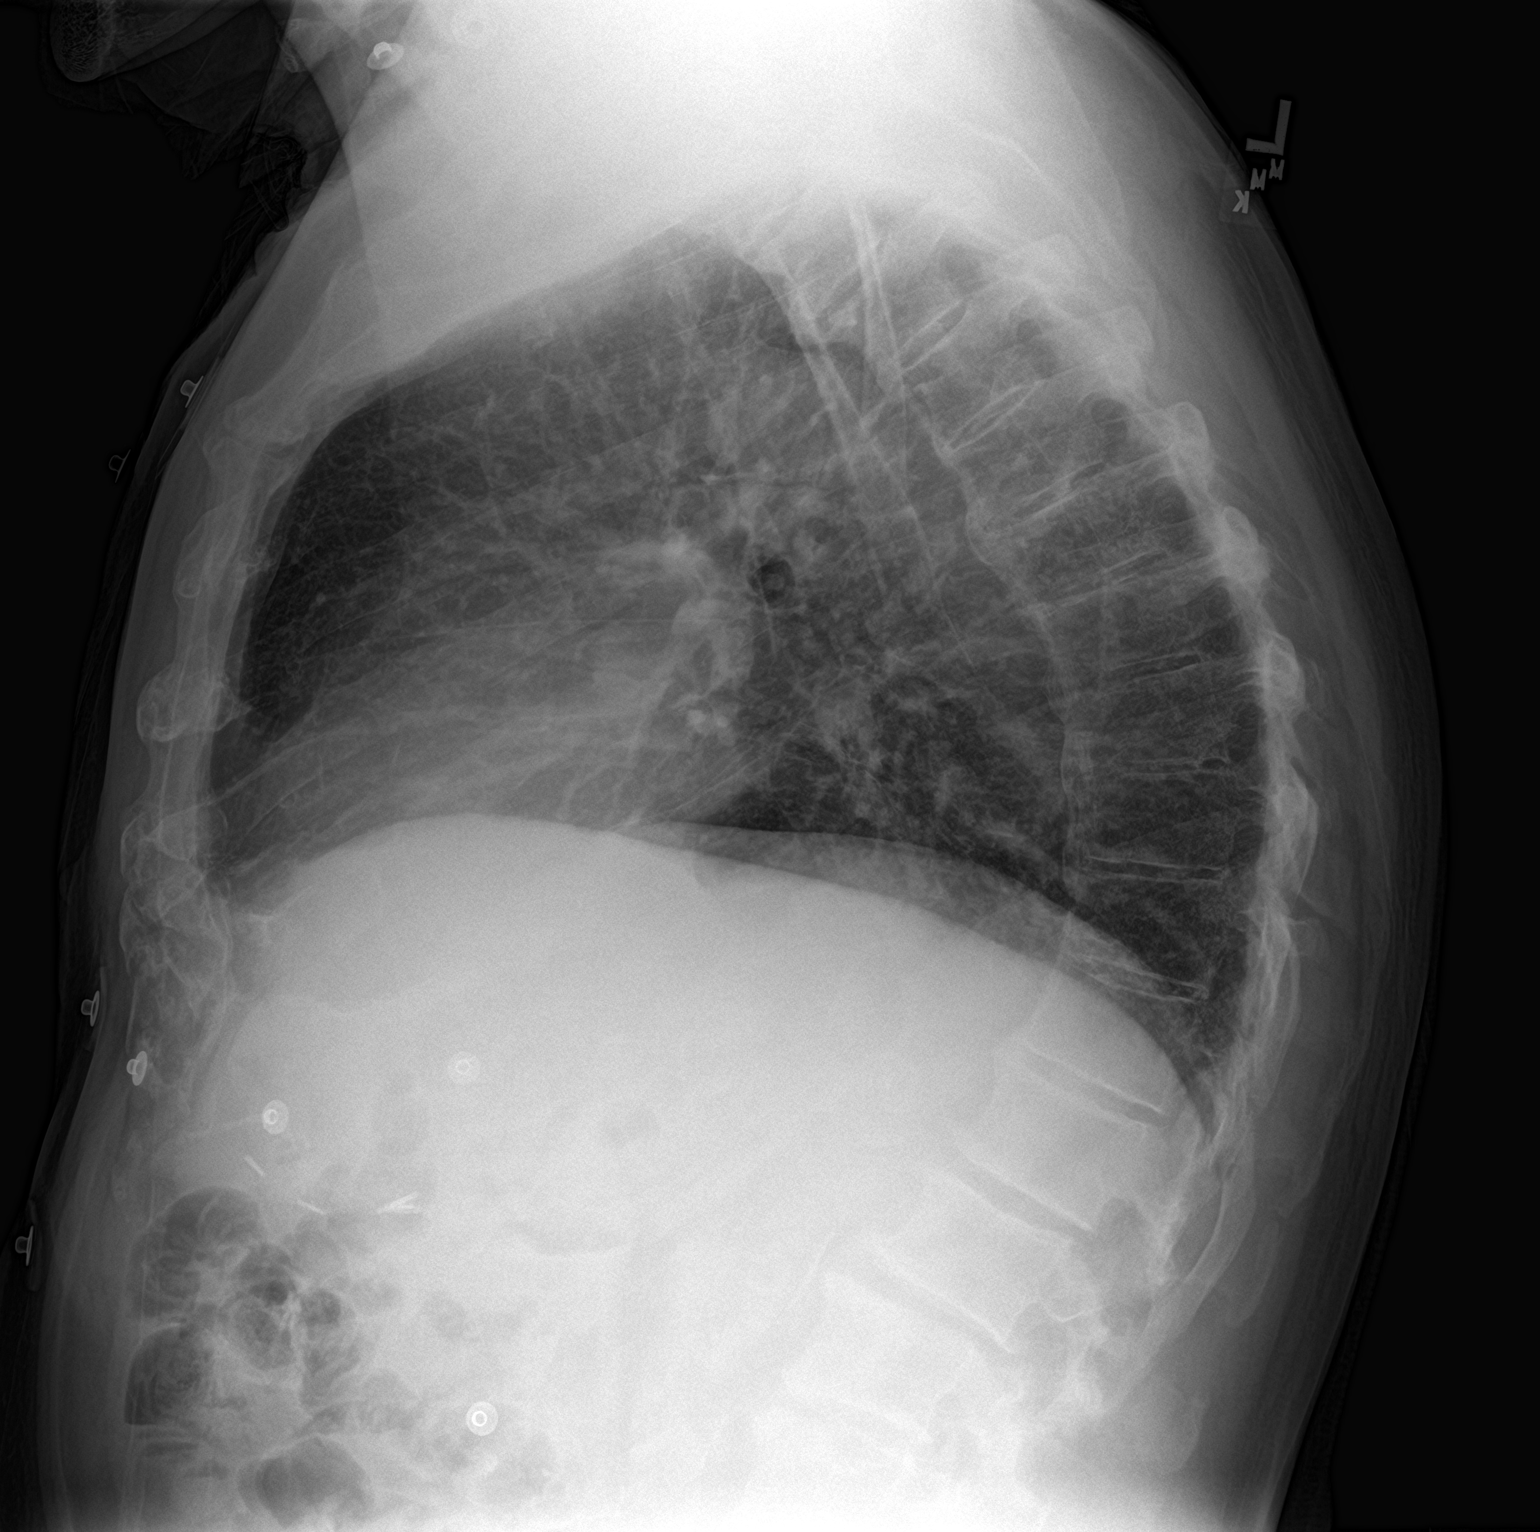

[2 of 2 positions shown; findings below may reference images not displayed]

FINDINGS: Shallow lung inflation. Heart size is normal. No focal
consolidations or pleural effusions. Patient is kyphotic. Surgical
clips are present in the upper abdomen.
IMPRESSION: 1. Shallow inflation.
2.  No evidence for acute cardiopulmonary abnormality.

## 2019-01-17 DIAGNOSIS — E349 Endocrine disorder, unspecified: Secondary | ICD-10-CM | POA: Diagnosis not present

## 2019-01-24 DIAGNOSIS — E349 Endocrine disorder, unspecified: Secondary | ICD-10-CM | POA: Diagnosis not present

## 2019-01-24 DIAGNOSIS — I10 Essential (primary) hypertension: Secondary | ICD-10-CM | POA: Diagnosis not present

## 2019-01-24 DIAGNOSIS — D751 Secondary polycythemia: Secondary | ICD-10-CM | POA: Diagnosis not present

## 2019-01-24 DIAGNOSIS — E782 Mixed hyperlipidemia: Secondary | ICD-10-CM | POA: Diagnosis not present

## 2019-01-27 DIAGNOSIS — N183 Chronic kidney disease, stage 3 (moderate): Secondary | ICD-10-CM | POA: Diagnosis not present

## 2019-01-27 DIAGNOSIS — I10 Essential (primary) hypertension: Secondary | ICD-10-CM | POA: Diagnosis not present

## 2019-01-27 DIAGNOSIS — E349 Endocrine disorder, unspecified: Secondary | ICD-10-CM | POA: Diagnosis not present

## 2019-01-27 DIAGNOSIS — E782 Mixed hyperlipidemia: Secondary | ICD-10-CM | POA: Diagnosis not present

## 2019-01-31 DIAGNOSIS — E349 Endocrine disorder, unspecified: Secondary | ICD-10-CM | POA: Diagnosis not present

## 2019-02-14 DIAGNOSIS — E349 Endocrine disorder, unspecified: Secondary | ICD-10-CM | POA: Diagnosis not present

## 2019-02-28 DIAGNOSIS — E349 Endocrine disorder, unspecified: Secondary | ICD-10-CM | POA: Diagnosis not present

## 2019-03-07 ENCOUNTER — Other Ambulatory Visit: Payer: Self-pay | Admitting: Cardiology

## 2019-03-14 DIAGNOSIS — E349 Endocrine disorder, unspecified: Secondary | ICD-10-CM | POA: Diagnosis not present

## 2019-03-16 ENCOUNTER — Telehealth: Payer: Self-pay | Admitting: Cardiology

## 2019-03-16 NOTE — Telephone Encounter (Signed)
1. What dental office are you calling from? Dr Lavella Hammock   2. What is your office phone number? [phone]  3. What is your fax number? [phone]  4. What type of procedure is the patient having performed? Tooth excration   5. What date is procedure scheduled or is the patient there now? TBD  (if the patient is at the dentist's office question goes to their cardiologist if he/she is in the office.  If not, question should go to the DOD).   6. What is your question (ex. Antibiotics prior to procedure, holding medication-we need to know how long dentist wants pt to hold med)? How long to hold his Plavix.

## 2019-03-16 NOTE — Telephone Encounter (Signed)
   Primary Cardiologist: Glenetta Hew, MD  Chart reviewed as part of pre-operative protocol coverage. Simple dental extractions are considered low risk procedures per guidelines and generally do not require any specific cardiac clearance. It is also generally accepted that for simple extractions and dental cleanings, there is no need to interrupt blood thinner therapy.     I will route this recommendation to the requesting party via Epic fax function and remove from pre-op pool.  Please call with questions.  Tami Lin Verl Kitson, PA 03/16/2019, 1:37 PM

## 2019-03-29 DIAGNOSIS — E349 Endocrine disorder, unspecified: Secondary | ICD-10-CM | POA: Diagnosis not present

## 2019-04-12 DIAGNOSIS — E349 Endocrine disorder, unspecified: Secondary | ICD-10-CM | POA: Diagnosis not present

## 2019-04-27 DIAGNOSIS — E349 Endocrine disorder, unspecified: Secondary | ICD-10-CM | POA: Diagnosis not present

## 2019-05-01 ENCOUNTER — Other Ambulatory Visit: Payer: Self-pay | Admitting: Cardiology

## 2019-05-09 ENCOUNTER — Other Ambulatory Visit: Payer: Self-pay | Admitting: Cardiology

## 2019-05-11 DIAGNOSIS — E349 Endocrine disorder, unspecified: Secondary | ICD-10-CM | POA: Diagnosis not present

## 2019-05-20 DIAGNOSIS — B078 Other viral warts: Secondary | ICD-10-CM | POA: Diagnosis not present

## 2019-06-02 DIAGNOSIS — E349 Endocrine disorder, unspecified: Secondary | ICD-10-CM | POA: Diagnosis not present

## 2019-06-16 DIAGNOSIS — E349 Endocrine disorder, unspecified: Secondary | ICD-10-CM | POA: Diagnosis not present

## 2019-06-30 DIAGNOSIS — E349 Endocrine disorder, unspecified: Secondary | ICD-10-CM | POA: Diagnosis not present

## 2019-07-01 ENCOUNTER — Other Ambulatory Visit: Payer: Self-pay | Admitting: Cardiology

## 2019-07-14 DIAGNOSIS — E349 Endocrine disorder, unspecified: Secondary | ICD-10-CM | POA: Diagnosis not present

## 2019-07-28 DIAGNOSIS — E349 Endocrine disorder, unspecified: Secondary | ICD-10-CM | POA: Diagnosis not present

## 2019-08-09 DIAGNOSIS — I10 Essential (primary) hypertension: Secondary | ICD-10-CM | POA: Diagnosis not present

## 2019-08-09 DIAGNOSIS — I701 Atherosclerosis of renal artery: Secondary | ICD-10-CM | POA: Diagnosis not present

## 2019-08-09 DIAGNOSIS — Z23 Encounter for immunization: Secondary | ICD-10-CM | POA: Diagnosis not present

## 2019-08-09 DIAGNOSIS — I712 Thoracic aortic aneurysm, without rupture: Secondary | ICD-10-CM | POA: Diagnosis not present

## 2019-08-09 DIAGNOSIS — R413 Other amnesia: Secondary | ICD-10-CM | POA: Diagnosis not present

## 2019-08-15 ENCOUNTER — Ambulatory Visit: Payer: Medicare Other | Admitting: Neurology

## 2019-08-15 ENCOUNTER — Telehealth: Payer: Self-pay | Admitting: Neurology

## 2019-08-15 ENCOUNTER — Other Ambulatory Visit: Payer: Self-pay

## 2019-08-15 ENCOUNTER — Encounter: Payer: Self-pay | Admitting: Neurology

## 2019-08-15 VITALS — BP 153/85 | HR 73 | Temp 98.4°F | Ht 71.0 in | Wt 224.0 lb

## 2019-08-15 DIAGNOSIS — R413 Other amnesia: Secondary | ICD-10-CM

## 2019-08-15 MED ORDER — DONEPEZIL HCL 5 MG PO TABS: 5.0000 mg | ORAL_TABLET | Freq: Every day | ORAL | 1 refills | Status: DC

## 2019-08-15 NOTE — Telephone Encounter (Signed)
[employer] medicare order sent to GI. No auth they will reach out to the patient to schedule.  

## 2019-08-15 NOTE — Progress Notes (Signed)
Reason for visit: Memory disturbance  Referring physician: Dr. Bernerd Pho is a 78 y.o. male  History of present illness:  Johnny Kane is an 78 year old right-handed white male with a history of a memory problem that has been present for 2 or 3 years, slowly getting worse.  The patient separated from his wife around that time, he believes that this has led to a reduction in stress.  He has a chronic issue with daytime drowsiness that he believes in part is related to a history of shift work, he is on Provigil.  He has noted a lifelong history of difficulty with remembering names for people, but he is having some problems with word finding at this time.  He has some short-term memory issues, he is still able to keep up with medications and appointments and do his finances although it takes more effort to do this.  He is able to operate a motor vehicle, but he uses GPS regularly.  He tries not to drive at nighttime.  He claims to have no family history of memory troubles.  He does have a other issues such as hypertension, prediabetes, dyslipidemia, coronary artery disease, and a history of depression.  He reports some numbness of the fifth finger on the left hand and some left shoulder and arm discomfort.  He denies weakness of the extremities or any significant issues controlling the bowels or the bladder.  He is sent to this office for an evaluation.  He has had routine blood work done recently to include vitamin B12 level, the results of this are not available to me.  Past Medical History:  Diagnosis Date  . Aortic calcification (HCC)    No evidence of AAA on CT Abd in 2013  . Aortic valve sclerosis    a. 12/2013 Echo: Aortic Sclerosis; EF 60-65%, Gr1 DD, mild AI, mildly dil LA.  Marland Kitchen CAD S/P percutaneous coronary angioplasty    a. 07/2011 Nl MV;  b. 11/2013 Cath: LM nl, LAD 20p, D1/2 nl, LCX 30p, OM1 40, OM2 nl, RCA 90-31m (3.5x18 Xience DES). c. Myoview 04/2014: No ischmia/Infarction,  normal EF. d. patent stent by cath in 10/2016.  Marland Kitchen Depression   . Diabetes mellitus without complication ([place])   . Diverticulosis of colon   . GERD (gastroesophageal reflux disease)   . Hiatal hernia   . History of MRSA infection   . Hyperlipidemia   . Hypertension   . Low testosterone   . OA (osteoarthritis)   . Palpitations   . Palpitations   . Sleep apnea    a. mild not on cpap   . Vasovagal episode   . Vitamin B 12 deficiency     Past Surgical History:  Procedure Laterality Date  . CARDIAC CATHETERIZATION N/A 10/23/2016   Procedure: Left Heart Cath and Coronary Angiography;  Surgeon: Leonie Man, MD;  Location: [facility];  Service: Cardiovascular:: Widely patent RCA stent and otherwise minimal CAD. EF 50-55%. Normal LVEDP  . CHOLECYSTECTOMY N/A 07/20/2013   Procedure: LAPAROSCOPIC CHOLECYSTECTOMY;  Surgeon: Joyice Faster. Cornett, MD;  Location: [facility];  Service: General;  Laterality: N/A;  . CORONARY ANGIOPLASTY WITH STENT PLACEMENT  11/25/2013   mRCA 90-95% - 0% (3.5x18 Xience DES).  . LEFT HEART CATHETERIZATION WITH CORONARY ANGIOGRAM N/A 11/25/2013   Procedure: LEFT HEART CATHETERIZATION WITH CORONARY ANGIOGRAM;  Surgeon: Leonie Man, MD;  Location: [facility];  Service: Cardiovascular: LM nl, LAD 20p, D1/2 nl, LCX  30p, OM1 40, OM2 nl, RCA 90-74m -- DES PCI.   Marland Kitchen NM MYOVIEW LTD  10/2016   EF 44% with diffuse hypokinesis.  Otherwise no ischemia or infarction. -->  Follow-up catheterization showed widely patent RCA stent, otherwise normal coronary arteries with EF 55% and normal LVEDP.  Marland Kitchen REPLACEMENT TOTAL KNEE     one on each knee  . TONSILLECTOMY    . TRANSTHORACIC ECHOCARDIOGRAM  February 2015   EF 60-65%, Gr1DD, mild AI with aortic sclerosis, mildly dilated LA.  Marland Kitchen TRANSTHORACIC ECHOCARDIOGRAM  03/2017   mild LVH. EF 50-55%. GR1 DD, Elevated LVEDP. Trace AI. Mild Ao root dilation.    Family History  Problem Relation Age of Onset  . Asthma Mother   .  Hypertension Mother   . Heart attack Father     Social history:  reports that he quit smoking about 51 years ago. He quit after 11.00 years of use. He has never used smokeless tobacco. He reports that he does not drink alcohol or use drugs.  Medications:  Prior to Admission medications   Medication Sig Start Date End Date Taking? Authorizing Provider  acetaminophen (TYLENOL) 325 MG tablet Take 2 tablets (650 mg total) by mouth every 4 (four) hours as needed for headache or mild pain. Patient taking differently: Take 325 mg by mouth daily as needed (pain).  11/26/13  Yes Erlene Quan, PA-C  aspirin EC 81 MG tablet Take 81-324 mg by mouth as needed.    Yes [provider]  busPIRone (BUSPAR) 10 MG tablet Take 1 tablet by mouth 3 (three) times daily.  11/23/17  Yes [provider]  clindamycin (CLEOCIN) 150 MG capsule Take 600 mg by mouth See admin instructions. Take 4 capsules (600 mg) by mouth one hour prior to dental procedure   Yes [provider]  clopidogrel (PLAVIX) 75 MG tablet Take 1 tablet (75 mg total) by mouth daily. 09/08/18  Yes Leonie Man, MD  Cyanocobalamin (VITAMIN B-12) 5000 MCG TBDP Take 5,000 mcg by mouth daily after lunch.   Yes [provider]  escitalopram (LEXAPRO) 20 MG tablet Take 20 mg by mouth daily. 07/23/17  Yes [provider]  famotidine (PEPCID) 20 MG tablet Take 20 mg by mouth See admin instructions. Take 1 tablet (20 mg) by mouth twice daily - after lunch and at bedtime 01/12/17  Yes [provider]  furosemide (LASIX) 20 MG tablet TAKE 1 TABLET BY MOUTH EVERY DAY 06/01/18  Yes Leonie Man, MD  Guaifenesin [facility] MAXIMUM STRENGTH) 1200 MG TB12 Take 1,200 mg by mouth daily as needed (congestion).   Yes [provider]  isosorbide mononitrate (IMDUR) 60 MG 24 hr tablet Take 60 mg tablet at bedtime daily 09/08/18  Yes Leonie Man, MD  modafinil (PROVIGIL) 200 MG tablet Take 200 mg by mouth  See admin instructions. Take 1 tablet (200 mg) by mouth twice daily - every morning and at 4:30pm   Yes [provider]  nitroGLYCERIN (NITROSTAT) 0.4 MG SL tablet Place 1 tablet (0.4 mg total) under the tongue every 5 (five) minutes as needed for chest pain. Please make yearly appt with Dr. Ellyn Hack for November. 1st attempt 07/05/19  Yes Leonie Man, MD  olmesartan [facility]) 40 MG tablet Take a 40 mg tablet by mouth, 2 to 3 hours after waking up daily 09/08/18  Yes Leonie Man, MD  polyethylene glycol [facility] Floria Raveling) packet Take 17 g by mouth daily as needed (constipation).  Mix in 8 oz liquid and drink   Yes [provider]  rosuvastatin (CRESTOR) 5 MG tablet Take 5 mg by mouth at bedtime.  04/09/16  Yes [provider]      Allergies  Allergen Reactions  . Ciprofloxacin Hives and Itching  . Esomeprazole Magnesium Other (See Comments)    Pt does not recall a reaction to Nexium  . Penicillins Swelling    Hands and facial swelling   Has patient had a PCN reaction causing immediate rash, facial/tongue/throat swelling, SOB or lightheadedness with hypotension: no Has patient had a PCN reaction causing severe rash involving mucus membranes or skin necrosis: {no Has patient had a PCN reaction that required hospitalization {no Has patient had a PCN reaction occurring within the last 10 years: {no If all of the above answers are "NO", then may proceed with Cephalosporin use.  Manuela Neptune [Carisoprodol] Rash    Looks like measles  . Sulfa Drugs Cross Reactors Other (See Comments)    Unknown childhood allergic reaction    ROS:  Out of a complete 14 system review of symptoms, the patient complains only of the following symptoms, and all other reviewed systems are negative.  Memory problems Daytime drowsiness  Blood pressure (!) 153/85, pulse 73, temperature 98.4 F (36.9 C), temperature source Temporal, height 5\' 11"  (1.803 m), weight 224 lb (101.6 kg).   Physical Exam  General: The patient is alert and cooperative at the time of the examination.  The patient is minimally obese.  Eyes: Pupils are equal, round, and reactive to light. Discs are flat bilaterally.  Neck: The neck is supple, no carotid bruits are noted.  Respiratory: The respiratory examination is clear.  Cardiovascular: The cardiovascular examination reveals a regular rate and rhythm, no obvious murmurs or rubs are noted.  Skin: Extremities are without significant edema.  Neurologic Exam  Mental status: The patient is alert and oriented x 3 at the time of the examination. The Mini-Mental status examination done today shows a total score 26/30.  Cranial nerves: Facial symmetry is present. There is good sensation of the face to pinprick and soft touch bilaterally. The strength of the facial muscles and the muscles to head turning and shoulder shrug are normal bilaterally. Speech is well enunciated, no aphasia or dysarthria is noted. Extraocular movements are full. Visual fields are full. The tongue is midline, and the patient has symmetric elevation of the soft palate. No obvious hearing deficits are noted.  Motor: The motor testing reveals 5 over 5 strength of all 4 extremities. Good symmetric motor tone is noted throughout.  Sensory: Sensory testing is intact to pinprick, soft touch, vibration sensation, and position sense on the upper extremities.  With the lower extremities, there is a stocking pattern pinprick sensory deficit across the ankles bilaterally with significant impairment of vibration sensation in both feet, decreased position sense in the right foot more so than the left.  No evidence of extinction is noted.  Coordination: Cerebellar testing reveals good finger-nose-finger and heel-to-shin bilaterally.  Gait and station: Gait is normal. Tandem gait is minimally unsteady. Romberg is negative. No drift is seen.  Reflexes: Deep tendon reflexes are symmetric, but  are depressed in the lower extremities bilaterally. Toes are downgoing bilaterally.   Assessment/Plan:  1.  Memory disturbance  2.  Peripheral neuropathy by clinical examination  The patient does have multiple risk factors for cerebrovascular disease, he will be sent for MRI of the brain.  He is on Plavix.  He  has reported a gradual change in memory over the last several years, he currently lives alone.  The patient will be placed on Aricept, he will call for any dose adjustments.  He will follow-up here in 6 months.  Jill Alexanders MD 08/15/2019 8:27 AM  [facility] [address] [place], [place] [address]  Phone [phone] Fax [phone]

## 2019-08-15 NOTE — Patient Instructions (Signed)
We will start Aricept for the memory.   Begin Aricept (donepezil) at 5 mg at night for one month. If this medication is well-tolerated, please call our office and we will call in a prescription for the 10 mg tablets. Look out for side effects that may include nausea, diarrhea, weight loss, or stomach cramps. This medication will also cause a runny nose, therefore there is no need for allergy medications for this purpose.  

## 2019-09-01 ENCOUNTER — Other Ambulatory Visit: Payer: Medicare Other

## 2019-09-12 ENCOUNTER — Ambulatory Visit: Payer: Medicare Other | Admitting: Cardiology

## 2019-09-12 ENCOUNTER — Other Ambulatory Visit: Payer: Self-pay

## 2019-09-12 ENCOUNTER — Encounter: Payer: Self-pay | Admitting: Cardiology

## 2019-09-12 VITALS — BP 137/80 | HR 74 | Ht 71.0 in | Wt 218.4 lb

## 2019-09-12 DIAGNOSIS — E785 Hyperlipidemia, unspecified: Secondary | ICD-10-CM | POA: Diagnosis not present

## 2019-09-12 DIAGNOSIS — I10 Essential (primary) hypertension: Secondary | ICD-10-CM

## 2019-09-12 DIAGNOSIS — Z9861 Coronary angioplasty status: Secondary | ICD-10-CM | POA: Diagnosis not present

## 2019-09-12 DIAGNOSIS — M6289 Other specified disorders of muscle: Secondary | ICD-10-CM | POA: Insufficient documentation

## 2019-09-12 DIAGNOSIS — R413 Other amnesia: Secondary | ICD-10-CM

## 2019-09-12 DIAGNOSIS — I2510 Atherosclerotic heart disease of native coronary artery without angina pectoris: Secondary | ICD-10-CM

## 2019-09-12 MED ORDER — ISOSORBIDE MONONITRATE ER 60 MG PO TB24: ORAL_TABLET | ORAL | 3 refills | Status: DC

## 2019-09-12 NOTE — Progress Notes (Signed)
Primary Care Provider: Helen Hashimoto., MD Cardiologist: Glenetta Hew, MD Electrophysiologist:   Clinic Note: Chief Complaint  Patient presents with  . Follow-up    Mostly notes leg aching, decreased exercise tolerance  . Coronary Artery Disease    No angina, uses nitrates for "foggy head "    HPI:    Johnny Kane is a 78 y.o. male with a h/o CAD & palpitations along with memory loss/ dementia who presents today for annual f/u.  Johnny Kane was last seen on 09/08/2018 - started Imdur 60 mg qhs. Change Benicar dose time to 2-3 hr after awakening. -- Neurology started Aricept.  Recent Hospitalizations: n/a  Reviewed  CV studies:    The following studies were reviewed today: (if available, images/films reviewed: From Epic Chart or Care Everywhere) . None:  Interval History:   Johnny Kane returns here today mostly noting legs aching that happen sometimes when he just sitting there, but also when he is up moving around.  Usually it does help when he stops.  He is just a little bit down maybe even depressed but he says is not feeling depressed.  He says that he just does not have the get up and go that he used to have not able do what he wanted to do in order for as long as he used to do it.  He can keep up with what he was wanting to try to do.  He has intermittent lower extremity swelling with tingling in his legs.  He is very tangential in his story telling him of symptoms it is difficult to really ascertain what is actually discussing.  He has had some memory slip ups more so than usual.   CV Review of Symptoms (Summary) no chest pain or dyspnea on exertion positive for - edema and Possible claudication, fatigue (likely related to poor sleep) negative for - irregular heartbeat, orthopnea, palpitations, paroxysmal nocturnal dyspnea, rapid heart rate, shortness of breath or Syncope/near syncope, TIA/amaurosis fugax  The patient does not have symptoms concerning  for COVID-19 infection (fever, chills, cough, or new shortness of breath).  The patient is practicing social distancing. ++ Masking.  Really does not go out much for groceries/shopping, get gets very needs and comes home.   REVIEWED OF SYSTEMS   A comprehensive ROS was performed. Review of Systems  Constitutional: Positive for malaise/fatigue (Not as much endurance; odd sleep habits.  Sleeps late, then stays up late.) and weight loss (Back to the weight he was last year.,  Mostly because he just not eating as well).  HENT: Negative for congestion and nosebleeds.   Respiratory: Negative for cough and shortness of breath.   Cardiovascular: Positive for leg swelling. Negative for claudication (Legs ache with walking, but also at rest).  Gastrointestinal: Positive for constipation (Has to be careful) and heartburn. Negative for abdominal pain, blood in stool, diarrhea and melena.  Genitourinary: Negative for hematuria.  Musculoskeletal: Positive for back pain and joint pain. Negative for falls.  Neurological: Positive for dizziness, tingling, weakness (Legs get weak.) and headaches. Negative for focal weakness.  Psychiatric/Behavioral: Positive for depression (Borderline depression, dysthymia) and memory loss. The patient is not nervous/anxious.        Still has frequent episodes of just feeling confused and foggy headed.  Usually this response to nitroglycerin.  All other systems reviewed and are negative.  I have reviewed and (if needed) personally updated the patient's problem list, medications, allergies, past medical and surgical  history, social and family history.   PAST MEDICAL HISTORY   Past Medical History:  Diagnosis Date  . Aortic calcification (HCC)    No evidence of AAA on CT Abd in 2013  . Aortic valve sclerosis    a. 12/2013 Echo: Aortic Sclerosis; EF 60-65%, Gr1 DD, mild AI, mildly dil LA.  Marland Kitchen CAD S/P percutaneous coronary angioplasty    a. 07/2011 Nl MV;  b. 11/2013 Cath: LM  nl, LAD 20p, D1/2 nl, LCX 30p, OM1 40, OM2 nl, RCA 90-50m (3.5x18 Xience DES). c. Myoview 04/2014: No ischmia/Infarction, normal EF. d. patent stent by cath in 10/2016.  Marland Kitchen Depression   . Diabetes mellitus without complication ([place])   . Diverticulosis of colon   . GERD (gastroesophageal reflux disease)   . Hiatal hernia   . History of MRSA infection   . Hyperlipidemia   . Hypertension   . Low testosterone   . OA (osteoarthritis)   . Palpitations   . Palpitations   . Sleep apnea    a. mild not on cpap   . Vasovagal episode   . Vitamin B 12 deficiency      PAST SURGICAL HISTORY   Past Surgical History:  Procedure Laterality Date  . CARDIAC CATHETERIZATION N/A 10/23/2016   Procedure: Left Heart Cath and Coronary Angiography;  Surgeon: Leonie Man, MD;  Location: [facility];  Service: Cardiovascular:: Widely patent RCA stent and otherwise minimal CAD. EF 50-55%. Normal LVEDP  . CHOLECYSTECTOMY N/A 07/20/2013   Procedure: LAPAROSCOPIC CHOLECYSTECTOMY;  Surgeon: Joyice Faster. Cornett, MD;  Location: [facility];  Service: General;  Laterality: N/A;  . CORONARY ANGIOPLASTY WITH STENT PLACEMENT  11/25/2013   mRCA 90-95% - 0% (3.5x18 Xience DES).  . LEFT HEART CATHETERIZATION WITH CORONARY ANGIOGRAM N/A 11/25/2013   Procedure: LEFT HEART CATHETERIZATION WITH CORONARY ANGIOGRAM;  Surgeon: Leonie Man, MD;  Location: [facility] C[facility];  Service: Cardiovascular: LM nl, LAD 20p, D1/2 nl, LCX 30p, OM1 40, OM2 nl, RCA 90-70m -- DES PCI.   Marland Kitchen NM MYOVIEW LTD  10/2016   EF 44% with diffuse hypokinesis.  Otherwise no ischemia or infarction. -->  Follow-up catheterization showed widely patent RCA stent, otherwise normal coronary arteries with EF 55% and normal LVEDP.  Marland Kitchen REPLACEMENT TOTAL KNEE     one on each knee  . TONSILLECTOMY    . TRANSTHORACIC ECHOCARDIOGRAM  February 2015   EF 60-65%, Gr1DD, mild AI with aortic sclerosis, mildly dilated LA.  Marland Kitchen TRANSTHORACIC ECHOCARDIOGRAM  03/2017   mild LVH.  EF 50-55%. GR1 DD, Elevated LVEDP. Trace AI. Mild Ao root dilation.    Cardiac catheterization 10/23/2016: Widely patent RCA stent and otherwise minimal CAD. EF 50-55%.Normal LVEDP   MEDICATIONS/ALLERGIES   Current Meds  Medication Sig  . acetaminophen (TYLENOL) 325 MG tablet Take 2 tablets (650 mg total) by mouth every 4 (four) hours as needed for headache or mild pain. (Patient taking differently: Take 325 mg by mouth daily as needed (pain). )  . busPIRone (BUSPAR) 10 MG tablet Take 1 tablet by mouth 3 (three) times daily.   . clindamycin (CLEOCIN) 150 MG capsule Take 600 mg by mouth See admin instructions. Take 4 capsules (600 mg) by mouth one hour prior to dental procedure  . clopidogrel (PLAVIX) 75 MG tablet Take 1 tablet (75 mg total) by mouth daily.  . Cyanocobalamin (VITAMIN B-12) 5000 MCG TBDP Take 5,000 mcg by mouth daily after lunch.  . escitalopram (LEXAPRO) 20 MG tablet Take 20  mg by mouth daily.  . famotidine (PEPCID) 20 MG tablet Take 20 mg by mouth See admin instructions. Take 1 tablet (20 mg) by mouth twice daily - after lunch and at bedtime  . furosemide (LASIX) 20 MG tablet TAKE 1 TABLET BY MOUTH EVERY DAY  . Guaifenesin (MUCINEX MAXIMUM STRENGTH) 1200 MG TB12 Take 1,200 mg by mouth daily as needed (congestion).  . isosorbide mononitrate (IMDUR) 60 MG 24 hr tablet Take 60 mg tablet twice a day  . modafinil (PROVIGIL) 200 MG tablet Take 200 mg by mouth See admin instructions. Take 1 tablet (200 mg) by mouth twice daily - every morning and at 4:30pm  . nitroGLYCERIN (NITROSTAT) 0.4 MG SL tablet Place 1 tablet (0.4 mg total) under the tongue every 5 (five) minutes as needed for chest pain. Please make yearly appt with Dr. Ellyn Hack for November. 1st attempt  . olmesartan (BENICAR) 40 MG tablet Take a 40 mg tablet by mouth, 2 to 3 hours after waking up daily  . polyethylene glycol (MIRALAX / GLYCOLAX) packet Take 17 g by mouth daily as needed (constipation). Mix in 8 oz liquid  and drink  . [DISCONTINUED] aspirin EC 81 MG tablet Take 81-324 mg by mouth as needed.   . [DISCONTINUED] isosorbide mononitrate (IMDUR) 60 MG 24 hr tablet Take 60 mg tablet at bedtime daily  . [DISCONTINUED] rosuvastatin (CRESTOR) 5 MG tablet Take 5 mg by mouth at bedtime.     Allergies  Allergen Reactions  . Ciprofloxacin Hives and Itching  . Esomeprazole Magnesium Other (See Comments)    Pt does not recall a reaction to Nexium  . Penicillins Swelling    Hands and facial swelling   Has patient had a PCN reaction causing immediate rash, facial/tongue/throat swelling, SOB or lightheadedness with hypotension: no Has patient had a PCN reaction causing severe rash involving mucus membranes or skin necrosis: {no Has patient had a PCN reaction that required hospitalization {no Has patient had a PCN reaction occurring within the last 10 years: {no If all of the above answers are "NO", then may proceed with Cephalosporin use.  Manuela Neptune [Carisoprodol] Rash    Looks like measles  . Sulfa Drugs Cross Reactors Other (See Comments)    Unknown childhood allergic reaction     SOCIAL HISTORY/FAMILY HISTORY   Social History   Tobacco Use  . Smoking status: Former Smoker    Years: 11.00    Quit date: 11/04/1967    Years since quitting: 51.9  . Smokeless tobacco: Never Used  Substance Use Topics  . Alcohol use: No  . Drug use: No   Social History   Social History Narrative   Married father of 2. Retired from [employer].   Former smoker - quit in 1969 after 11 years; denies alcohol consumption    Family History family history includes Asthma in his mother; Heart attack in his father; Hypertension in his mother.   OBJCTIVE -PE, EKG, labs   Wt Readings from Last 3 Encounters:  09/12/19 218 lb 6.4 oz (99.1 kg)  08/15/19 224 lb (101.6 kg)  09/08/18 217 lb (98.4 kg)    Physical Exam: BP 137/80   Pulse 74   Ht 5\' 11"  (1.803 m)   Wt 218 lb 6.4 oz (99.1 kg)   SpO2 97%   BMI 30.46 kg/m   Physical Exam  Constitutional: He is oriented to person, place, and time. He appears well-developed and well-nourished. No distress.  Relatively healthy-appearing well-groomed elderly gentleman.  HENT:  Head: Normocephalic and atraumatic.  Eyes:  Wears his bifocal glasses far down on his nose  Neck: Normal range of motion. Neck supple. No JVD present.  Cardiovascular: Normal rate, regular rhythm, S1 normal and intact distal pulses.  Occasional extrasystoles are present. PMI is not displaced. Exam reveals no gallop and no friction rub.  No murmur heard. Split S2 from RBBB  Pulmonary/Chest: Effort normal and breath sounds normal. No respiratory distress. He has no wheezes. He has no rales.  Abdominal: Soft. Bowel sounds are normal. He exhibits no distension. There is no abdominal tenderness. There is no rebound.  Mildly obese.  No HSM  Musculoskeletal: Normal range of motion.        General: Edema (Trivial bilateral lower extremity) present.  Neurological: He is alert and oriented to person, place, and time.  Psychiatric: His behavior is normal. Thought content normal.  Overall poor historian.  Tangential thought.  Bland/flat affect.  Somewhat down mood  Vitals reviewed.    Adult ECG Report  Rate: 74 ;  Rhythm: normal sinus rhythm and RBBB.  Normal axis, intervals and durations.;   Narrative Interpretation: Stable EKG  Recent Labs: 01/24/2019: TC 126, TG 176, HDL 31, LDL 60.  A1c 5.8.  Hgb 16.3.  CR 1.34.  K+ 5.1.  TSH 1.07. Lab Results  Component Value Date   CHOL 118 03/03/2017   HDL 34 (L) 03/03/2017   LDLCALC 64 03/03/2017   TRIG 98 03/03/2017   CHOLHDL 3.5 03/03/2017   Lab Results  Component Value Date   CREATININE 1.39 (H) 03/03/2017   BUN 28 (H) 03/03/2017   NA 140 03/03/2017   K 4.1 03/03/2017   CL 105 03/03/2017   CO2 30 03/03/2017    ASSESSMENT/PLAN   Johnny Kane seems to be stable overall from a cardiac standpoint as far as symptoms go with no we will chest pain or  dyspnea with rest or exertion.  The use of night nitroglycerin is interesting because he uses it to help clear his thoughts which is a interesting concept.  However since it seems to be helping, increase his Imdur to 120 mg.  No sensation of palpitations.  Doing well without beta-blocker.  Allowing for mild permissive hypertension.  Because of the memory issues I think we can stop his statin in hopes that this will potentially remove any concerns about statin related memory issues.  He is only on very low-dose, and at this point quality of life is important.  For his mild edema we will continue his standing dose of Lasix which can take additional doses.  Continue to encourage him to continue trying to do whatever he can from an access standpoint.  Problem List Items Addressed This Visit    CAD S/P percutaneous coronary angioplasty - RCA DES 11/25/13 - Primary (Chronic)    No active angina.  He is on high-dose Imdur more for memory issues. On ARB. Stopping statin for memory issues.  He is on Plavix plus aspirin.  We will stop aspirin and continue Plavix.  Okay to hold Plavix for procedures.      Relevant Medications   isosorbide mononitrate (IMDUR) 60 MG 24 hr tablet   Other Relevant Orders   EKG 12-Lead (Completed)   Essential hypertension (Chronic)    Blood pressure is at the borderline level, he is on Benicar which we are having him take after waking up.  He is not on beta-blocker because of the fatigue issue even though he was having PVCs.  The  PVCs do not seem to be as much of a problem.      Relevant Medications   isosorbide mononitrate (IMDUR) 60 MG 24 hr tablet   Hyperlipidemia with target LDL less than 70 (Chronic)    Well-controlled.  However I am concerned about his memory issues. Plan for now will be to stop low-dose statin to avoid any potential memory issues related to it.      Relevant Medications   isosorbide mononitrate (IMDUR) 60 MG 24 hr tablet   Memory loss or  impairment (Chronic)    This is concerning.  Plan is to allow allow for adequate blood pressure for cerebral perfusion.  Avoiding hypotension. Nitrate seems to be helping so we will increase Imdur to 60 mg twice daily. Stopping statin to avoid potential side effect.      Exercise-induced leg fatigue    He is noting some leg discomfort with walking, more for assuage he fears, we will check arterial Dopplers.      Relevant Orders   LEA---- VAS [place] LOWER EXTREMITY ARTERIAL DUPLEX       COVID-19 Education: The signs and symptoms of COVID-19 were discussed with the patient and how to seek care for testing (follow up with PCP or arrange E-visit).   The importance of social distancing was discussed today.  I spent a total of 38minutes with the patient and chart review. >  50% of the time was spent in direct patient consultation.  Additional time spent with chart review (studies, outside notes, etc): 6 Total Time: 26 min   Current medicines are reviewed at length with the patient today.  (+/- concerns) none   Patient Instructions / Medication Changes & Studies & Tests Ordered   Patient Instructions  Medication Instructions:   STOP TAKING ROSUVASTATIN   STOP TAKING ASPIRIN  INCREASE IMDUR ( ISOSORBIDE) 60 MG TWICE A DAY   CONTINUE ALL OTHER MEDICATIONS  *If you need a refill on your cardiac medications before your next appointment, please call your pharmacy*  Lab Work: NOT NEEDED   Testing/Procedures: Will be schedule at [place] has requested that you have a lower extremity arterial duplex. This test is an ultrasound of the arteries in the legs . It looks at arterial blood flow in the legs . Allow one hour for Lower  Arterial scans. There are no restrictions or special instructions  Follow-Up: At [facility], you and your health needs are our priority.  As part of our continuing mission to provide you with exceptional heart care, we have  created designated Provider Care Teams.  These Care Teams include your primary Cardiologist (physician) and Advanced Practice Providers (APPs -  Physician Assistants and Nurse Practitioners) who all work together to provide you with the care you need, when you need it.  Your next appointment:   12 months- nov 2021  The format for your next appointment:   In Person  Provider:   Glenetta Hew, MD  Other Instructions     Studies Ordered:   Orders Placed This Encounter  Procedures  . EKG 12-Lead  . LEA---- VAS [place] LOWER EXTREMITY ARTERIAL DUPLEX     Glenetta Hew, M.D., M.S. Interventional Cardiologist   Pager # [phone] Phone # [phone] [address] [place], [place] [address]   Thank you for choosing Heartcare at [facility]!!

## 2019-09-12 NOTE — Patient Instructions (Signed)
Medication Instructions:   STOP TAKING ROSUVASTATIN   STOP TAKING ASPIRIN  INCREASE IMDUR ( ISOSORBIDE) 60 MG TWICE A DAY   CONTINUE ALL OTHER MEDICATIONS  *If you need a refill on your cardiac medications before your next appointment, please call your pharmacy*  Lab Work: NOT NEEDED   Testing/Procedures: Will be schedule at [place] has requested that you have a lower extremity arterial duplex. This test is an ultrasound of the arteries in the legs . It looks at arterial blood flow in the legs . Allow one hour for Lower  Arterial scans. There are no restrictions or special instructions  Follow-Up: At [facility], you and your health needs are our priority.  As part of our continuing mission to provide you with exceptional heart care, we have created designated Provider Care Teams.  These Care Teams include your primary Cardiologist (physician) and Advanced Practice Providers (APPs -  Physician Assistants and Nurse Practitioners) who all work together to provide you with the care you need, when you need it.  Your next appointment:   12 months- nov 2021  The format for your next appointment:   In Person  Provider:   Glenetta Hew, MD  Other Instructions

## 2019-09-13 ENCOUNTER — Other Ambulatory Visit: Payer: Self-pay | Admitting: Cardiology

## 2019-09-13 DIAGNOSIS — I739 Peripheral vascular disease, unspecified: Secondary | ICD-10-CM

## 2019-09-14 ENCOUNTER — Ambulatory Visit (HOSPITAL_COMMUNITY)
Admission: RE | Admit: 2019-09-14 | Payer: Medicare Other | Source: Ambulatory Visit | Attending: Cardiology | Admitting: Cardiology

## 2019-09-14 DIAGNOSIS — R197 Diarrhea, unspecified: Secondary | ICD-10-CM | POA: Diagnosis not present

## 2019-09-14 DIAGNOSIS — R1110 Vomiting, unspecified: Secondary | ICD-10-CM | POA: Diagnosis not present

## 2019-09-17 ENCOUNTER — Other Ambulatory Visit: Payer: Medicare Other

## 2019-09-17 ENCOUNTER — Encounter: Payer: Self-pay | Admitting: Cardiology

## 2019-09-18 NOTE — Assessment & Plan Note (Signed)
Well-controlled.  However I am concerned about his memory issues. Plan for now will be to stop low-dose statin to avoid any potential memory issues related to it.

## 2019-09-18 NOTE — Assessment & Plan Note (Signed)
He is noting some leg discomfort with walking, more for assuage he fears, we will check arterial Dopplers.

## 2019-09-18 NOTE — Assessment & Plan Note (Signed)
This is concerning.  Plan is to allow allow for adequate blood pressure for cerebral perfusion.  Avoiding hypotension. Nitrate seems to be helping so we will increase Imdur to 60 mg twice daily. Stopping statin to avoid potential side effect.

## 2019-09-18 NOTE — Assessment & Plan Note (Signed)
Blood pressure is at the borderline level, he is on Benicar which we are having him take after waking up.  He is not on beta-blocker because of the fatigue issue even though he was having PVCs.  The PVCs do not seem to be as much of a problem.

## 2019-09-18 NOTE — Assessment & Plan Note (Signed)
No active angina.  He is on high-dose Imdur more for memory issues. On ARB. Stopping statin for memory issues.  He is on Plavix plus aspirin.  We will stop aspirin and continue Plavix.  Okay to hold Plavix for procedures.

## 2019-09-20 ENCOUNTER — Other Ambulatory Visit: Payer: Self-pay | Admitting: Cardiology

## 2019-09-22 ENCOUNTER — Telehealth: Payer: Self-pay | Admitting: Cardiology

## 2019-09-22 ENCOUNTER — Other Ambulatory Visit: Payer: Self-pay

## 2019-09-22 MED ORDER — CLOPIDOGREL BISULFATE 75 MG PO TABS: ORAL_TABLET | ORAL | 0 refills | Status: DC

## 2019-09-22 MED ORDER — CLOPIDOGREL BISULFATE 75 MG PO TABS: 75.0000 mg | ORAL_TABLET | Freq: Every day | ORAL | 0 refills | Status: DC

## 2019-09-22 MED ORDER — CLOPIDOGREL BISULFATE 75 MG PO TABS: ORAL_TABLET | ORAL | 3 refills | Status: DC

## 2019-09-22 NOTE — Telephone Encounter (Signed)
Patient calling the office for samples of medication:   1.  What medication and dosage are you requesting samples for? clopidogrel (PLAVIX) 75 MG tablet  2.  Are you currently out of this medication? Yes  I sent in a refill request to the refill team for this medication.

## 2019-09-22 NOTE — Telephone Encounter (Signed)
°*  STAT* If patient is at the pharmacy, call can be transferred to refill team.   1. Which medications need to be refilled? (please list name of each medication and dose if known) clopidogrel (PLAVIX) 75 MG tablet  2. Which pharmacy/location (including street and city if local pharmacy) is medication to be sent to? [place], [place]  3. Do they need a 30 day or 90 day supply? 63  Sent sample request to triage, patient is out of medication.

## 2019-09-22 NOTE — Telephone Encounter (Signed)
Spoke with pt who states he has not taken his clopidogrel in over a week going to 2 weeks because [facility] has not sent medication to him. Pt states that on his clopidogrel med bottle it stated that med Rx could not be filled after 11/5. Informed pt that nurse would follow up with [facility] on the issue. Advised pt that he can pick up 30-day supply of [facility] [address] [place], [place] so that he does not continue to be without medication while waiting for issues with [facility] to be resolved. Emailed goodrx coupon to pt e-mail address [email or web address]   Pt concerned about amount of med to be taken since he has not taken the medication in almost 2 weeks. Consulted pharmD Raquel, RPH who advised that pt take loading dose of 300 mg of clopidogrel on the day of restart of medication then starting the following day take only 75 mg daily. Rx with sig addressing loading dose and daily dose [facility] [address].   Contacted [facility]. Spoke with rep who stated that their records show that pt received 6 month supply on 02/04/2019. Informed rep that triage nurse would send Rx refill of Plavix to [facility].    Contacted pt again and updated on Rx refill being sent to [facility]. Pt verbalized understanding

## 2019-09-23 NOTE — Telephone Encounter (Signed)
See sample request telephone encounter.

## 2019-09-27 ENCOUNTER — Other Ambulatory Visit: Payer: Self-pay

## 2019-09-27 ENCOUNTER — Ambulatory Visit (HOSPITAL_COMMUNITY)
Admission: RE | Admit: 2019-09-27 | Discharge: 2019-09-27 | Disposition: A | Discharge status: Home / Self Care | Payer: Medicare Other | Source: Ambulatory Visit | Attending: Cardiology | Admitting: Cardiology

## 2019-09-27 DIAGNOSIS — I739 Peripheral vascular disease, unspecified: Secondary | ICD-10-CM | POA: Diagnosis not present

## 2019-10-14 ENCOUNTER — Other Ambulatory Visit: Payer: Self-pay | Admitting: Cardiology

## 2019-10-15 ENCOUNTER — Other Ambulatory Visit: Payer: Self-pay | Admitting: Cardiology

## 2019-10-17 ENCOUNTER — Other Ambulatory Visit: Payer: Self-pay | Admitting: *Deleted

## 2019-10-17 MED ORDER — OLMESARTAN MEDOXOMIL 40 MG PO TABS: ORAL_TABLET | ORAL | 3 refills | Status: DC

## 2019-10-17 MED ORDER — CLOPIDOGREL BISULFATE 75 MG PO TABS: ORAL_TABLET | ORAL | 3 refills | Status: DC

## 2019-10-18 NOTE — Telephone Encounter (Signed)
Rx has been sent to the pharmacy electronically. ° °

## 2019-10-21 ENCOUNTER — Other Ambulatory Visit: Payer: Self-pay

## 2019-10-21 MED ORDER — CLOPIDOGREL BISULFATE 75 MG PO TABS: 75.0000 mg | ORAL_TABLET | Freq: Every day | ORAL | 3 refills | Status: DC

## 2019-11-07 ENCOUNTER — Encounter: Payer: Self-pay | Admitting: General Practice

## 2019-11-07 ENCOUNTER — Other Ambulatory Visit: Payer: Self-pay | Admitting: Cardiology

## 2019-11-07 NOTE — Telephone Encounter (Signed)
New Message      *STAT* If patient is at the pharmacy, call can be transferred to refill team.   1. Which medications need to be refilled? (please list name of each medication and dose if known) clopidogrel (PLAVIX) 75 MG tablet  2. Which pharmacy/location (including street and city if local pharmacy) is medication to be sent to? [id number] [place] [place]  3. Do they need a 30 day or 90 day supply? 30     Pt says he is needing [facility]  and is needing a mail order sent too  [place], [place]  for 90 days

## 2019-11-08 MED ORDER — CLOPIDOGREL BISULFATE 75 MG PO TABS: 75.0000 mg | ORAL_TABLET | Freq: Every day | ORAL | 1 refills | Status: DC

## 2019-11-08 NOTE — Telephone Encounter (Signed)
Rx has been sent to the pharmacy electronically. ° °

## 2019-11-09 DIAGNOSIS — Z139 Encounter for screening, unspecified: Secondary | ICD-10-CM | POA: Diagnosis not present

## 2019-11-09 DIAGNOSIS — I10 Essential (primary) hypertension: Secondary | ICD-10-CM | POA: Diagnosis not present

## 2019-11-09 DIAGNOSIS — E782 Mixed hyperlipidemia: Secondary | ICD-10-CM | POA: Diagnosis not present

## 2019-11-09 DIAGNOSIS — R413 Other amnesia: Secondary | ICD-10-CM | POA: Diagnosis not present

## 2019-11-09 DIAGNOSIS — I701 Atherosclerosis of renal artery: Secondary | ICD-10-CM | POA: Diagnosis not present

## 2019-12-01 DIAGNOSIS — R0602 Shortness of breath: Secondary | ICD-10-CM | POA: Diagnosis not present

## 2019-12-10 ENCOUNTER — Other Ambulatory Visit: Payer: Self-pay | Admitting: Cardiology

## 2020-01-01 ENCOUNTER — Other Ambulatory Visit: Payer: Self-pay | Admitting: Cardiology

## 2020-02-11 ENCOUNTER — Other Ambulatory Visit: Payer: Self-pay | Admitting: Cardiology

## 2020-02-13 NOTE — Telephone Encounter (Signed)
Rx request sent to pharmacy.  

## 2020-02-14 ENCOUNTER — Other Ambulatory Visit: Payer: Self-pay

## 2020-02-14 ENCOUNTER — Other Ambulatory Visit: Payer: Self-pay | Admitting: Neurology

## 2020-02-14 ENCOUNTER — Encounter: Payer: Self-pay | Admitting: Neurology

## 2020-02-14 ENCOUNTER — Ambulatory Visit: Payer: Medicare Other | Admitting: Neurology

## 2020-02-14 ENCOUNTER — Telehealth: Payer: Self-pay | Admitting: Neurology

## 2020-02-14 VITALS — BP 149/79 | HR 71 | Temp 98.3°F | Ht 71.0 in | Wt 213.0 lb

## 2020-02-14 DIAGNOSIS — R413 Other amnesia: Secondary | ICD-10-CM

## 2020-02-14 MED ORDER — MEMANTINE HCL 5 MG PO TABS: ORAL_TABLET | ORAL | 0 refills | Status: DC

## 2020-02-14 NOTE — Progress Notes (Signed)
PATIENT: Johnny Kane DOB: 1941-02-26  REASON FOR VISIT: follow up HISTORY FROM: patient  HISTORY OF PRESENT ILLNESS: Today 02/14/20  Johnny Kane 79 year old male with history of memory problem.  He was unable to tolerate Aricept due to nausea.  He feels his memory is stable, mostly notices memory difficulty misplacing things around the house.  He lives alone, he is separated from his wife.  He has 3 dogs to keep him company.  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  He tries to be up by 12 noon, tries to be asleep by 4 AM.  He is on Provigil for daytime drowsiness.  He drives a car, he does use GPS regularly.  He reports adequately managing his medications, finances, and household affairs.  He talks about feeling that he is not having oxygen, waking up in the morning with excessive mucus in his throat and lungs.  At last visit, he was sent for MRI, but this was not completed.  He is taking Prevagen daily.  He says he has had fairly recent sleep evaluation that showed only mild apnea, not enough to treat.  He presents today for evaluation unaccompanied.  HISTORY 08/15/2019 Dr. Jannifer Franklin: Johnny Kane is an 79 year old right-handed white male with a history of a memory problem that has been present for 2 or 3 years, slowly getting worse.  The patient separated from his wife around that time, he believes that this has led to a reduction in stress.  He has a chronic issue with daytime drowsiness that he believes in part is related to a history of shift work, he is on Provigil.  He has noted a lifelong history of difficulty with remembering names for people, but he is having some problems with word finding at this time.  He has some short-term memory issues, he is still able to keep up with medications and appointments and do his finances although it takes more effort to do this.  He is able to operate a motor vehicle, but he uses GPS regularly.  He tries not to drive at  nighttime.  He claims to have no family history of memory troubles.  He does have a other issues such as hypertension, prediabetes, dyslipidemia, coronary artery disease, and a history of depression.  He reports some numbness of the fifth finger on the left hand and some left shoulder and arm discomfort.  He denies weakness of the extremities or any significant issues controlling the bowels or the bladder.  He is sent to this office for an evaluation.  He has had routine blood work done recently to include vitamin B12 level, the results of this are not available to me.   REVIEW OF SYSTEMS: Out of a complete 14 system review of symptoms, the patient complains only of the following symptoms, and all other reviewed systems are negative.  Memory loss  ALLERGIES: Allergies  Allergen Reactions  . Ciprofloxacin Hives and Itching  . Donepezil     Could not tolerate  . Esomeprazole Magnesium Other (See Comments)    Pt does not recall a reaction to Nexium  . Penicillins Swelling    Hands and facial swelling   Has patient had a PCN reaction causing immediate rash, facial/tongue/throat swelling, SOB or lightheadedness with hypotension: no Has patient had a PCN reaction causing severe rash involving mucus membranes or skin necrosis: {no Has patient had a PCN reaction that required hospitalization {no Has patient had a PCN  reaction occurring within the last 10 years: {no If all of the above answers are "NO", then may proceed with Cephalosporin use.  Manuela Neptune [Carisoprodol] Rash    Looks like measles  . Sulfa Drugs Cross Reactors Other (See Comments)    Unknown childhood allergic reaction    HOME MEDICATIONS: Outpatient Medications Prior to Visit  Medication Sig Dispense Refill  . acetaminophen (TYLENOL) 325 MG tablet Take 2 tablets (650 mg total) by mouth every 4 (four) hours as needed for headache or mild pain. (Patient taking differently: Take 325 mg by mouth daily as needed (pain). )    .  busPIRone (BUSPAR) 10 MG tablet Take 1 tablet by mouth 3 (three) times daily.   1  . clindamycin (CLEOCIN) 150 MG capsule Take 600 mg by mouth See admin instructions. Take 4 capsules (600 mg) by mouth one hour prior to dental procedure    . clopidogrel (PLAVIX) 75 MG tablet TAKE 1 TABLET BY MOUTH EVERY DAY 30 tablet 7  . Cyanocobalamin (VITAMIN B-12) 5000 MCG TBDP Take 5,000 mcg by mouth daily after lunch.    . escitalopram (LEXAPRO) 20 MG tablet Take 20 mg by mouth daily.  5  . famotidine (PEPCID) 20 MG tablet Take 20 mg by mouth See admin instructions. Take 1 tablet (20 mg) by mouth twice daily - after lunch and at bedtime  3  . furosemide (LASIX) 20 MG tablet TAKE 1 TABLET BY MOUTH  DAILY 90 tablet 3  . Guaifenesin (MUCINEX MAXIMUM STRENGTH) 1200 MG TB12 Take 1,200 mg by mouth daily as needed (congestion).    . isosorbide mononitrate (IMDUR) 60 MG 24 hr tablet Take 60 mg tablet twice a day 180 tablet 3  . modafinil (PROVIGIL) 200 MG tablet Take 200 mg by mouth See admin instructions. Take 1 tablet (200 mg) by mouth twice daily - every morning and at 4:30pm    . nitroGLYCERIN (NITROSTAT) 0.4 MG SL tablet DISSOLVE 1 TABLET UNDER THE TONGUE EVERY 5 MINUTES AS   NEEDED FOR CHEST PAIN. MAX  OF 3 TABLETS IN 15 MINUTES. CALL 911 IF PAIN PERSISTS. 25 tablet 4  . olmesartan (BENICAR) 40 MG tablet Take a 40 mg tablet by mouth, 2 to 3 hours after waking up daily 90 tablet 3  . polyethylene glycol (MIRALAX / GLYCOLAX) packet Take 17 g by mouth daily as needed (constipation). Mix in 8 oz liquid and drink     No facility-administered medications prior to visit.    PAST MEDICAL HISTORY: Past Medical History:  Diagnosis Date  . Aortic calcification (HCC)    No evidence of AAA on CT Abd in 2013  . Aortic valve sclerosis    a. 12/2013 Echo: Aortic Sclerosis; EF 60-65%, Gr1 DD, mild AI, mildly dil LA.  Marland Kitchen CAD S/P percutaneous coronary angioplasty    a. 07/2011 Nl MV;  b. 11/2013 Cath: LM nl, LAD 20p, D1/2 nl,  LCX 30p, OM1 40, OM2 nl, RCA 90-52m (3.5x18 Xience DES). c. Myoview 04/2014: No ischmia/Infarction, normal EF. d. patent stent by cath in 10/2016.  Marland Kitchen Depression   . Diabetes mellitus without complication ([place])   . Diverticulosis of colon   . GERD (gastroesophageal reflux disease)   . Hiatal hernia   . History of MRSA infection   . Hyperlipidemia   . Hypertension   . Low testosterone   . OA (osteoarthritis)   . Palpitations   . Palpitations   . Sleep apnea    a. mild not on  cpap   . Vasovagal episode   . Vitamin B 12 deficiency     PAST SURGICAL HISTORY: Past Surgical History:  Procedure Laterality Date  . CARDIAC CATHETERIZATION N/A 10/23/2016   Procedure: Left Heart Cath and Coronary Angiography;  Surgeon: Leonie Man, MD;  Location: [facility];  Service: Cardiovascular:: Widely patent RCA stent and otherwise minimal CAD. EF 50-55%. Normal LVEDP  . CHOLECYSTECTOMY N/A 07/20/2013   Procedure: LAPAROSCOPIC CHOLECYSTECTOMY;  Surgeon: Joyice Faster. Cornett, MD;  Location: [facility];  Service: General;  Laterality: N/A;  . CORONARY ANGIOPLASTY WITH STENT PLACEMENT  11/25/2013   mRCA 90-95% - 0% (3.5x18 Xience DES).  . LEFT HEART CATHETERIZATION WITH CORONARY ANGIOGRAM N/A 11/25/2013   Procedure: LEFT HEART CATHETERIZATION WITH CORONARY ANGIOGRAM;  Surgeon: Leonie Man, MD;  [facility]: [facility];  Service: Cardiovascular: LM nl, LAD 20p, D1/2 nl, LCX 30p, OM1 40, OM2 nl, RCA 90-28m -- DES PCI.   Marland Kitchen NM MYOVIEW LTD  10/2016   EF 44% with diffuse hypokinesis.  Otherwise no ischemia or infarction. -->  Follow-up catheterization showed widely patent RCA stent, otherwise normal coronary arteries with EF 55% and normal LVEDP.  Marland Kitchen REPLACEMENT TOTAL KNEE     one on each knee  . TONSILLECTOMY    . TRANSTHORACIC ECHOCARDIOGRAM  February 2015   EF 60-65%, Gr1DD, mild AI with aortic sclerosis, mildly dilated LA.  Marland Kitchen TRANSTHORACIC ECHOCARDIOGRAM  03/2017   mild LVH. EF 50-55%. GR1 DD, Elevated  LVEDP. Trace AI. Mild Ao root dilation.    FAMILY HISTORY: Family History  Problem Relation Age of Onset  . Asthma Mother   . Hypertension Mother   . Heart attack Father     SOCIAL HISTORY: Social History   Socioeconomic History  . Marital status: Legally Separated    Spouse name: Not on file  . Number of children: 2  . Years of education: Not on file  . Highest education level: Not on file  Occupational History  . Occupation: [employer]    Comment: retired  Tobacco Use  . Smoking status: Former Smoker    Years: 11.00    Quit date: 11/04/1967    Years since quitting: 52.3  . Smokeless tobacco: Never Used  Substance and Sexual Activity  . Alcohol use: No  . Drug use: No  . Sexual activity: Not on file  Other Topics Concern  . Not on file  Social History Narrative   Married father of 2. Retired from [employer].   Former smoker - quit in 1969 after 11 years; denies alcohol consumption   Social Determinants of Radio broadcast assistant Strain:   . Difficulty of Paying Living Expenses:   Food Insecurity:   . Worried About Charity fundraiser in the Last Year:   . Arboriculturist in the Last Year:   Transportation Needs:   . Film/video editor (Medical):   Marland Kitchen Lack of Transportation (Non-Medical):   Physical Activity:   . Days of Exercise per Week:   . Minutes of Exercise per Session:   Stress:   . Feeling of Stress :   Social Connections:   . Frequency of Communication with Friends and Family:   . Frequency of Social Gatherings with Friends and Family:   . Attends Religious Services:   . Active Member of Clubs or Organizations:   . Attends Archivist Meetings:   Marland Kitchen Marital Status:   Intimate Partner Violence:   . Fear  of Current or Ex-Partner:   . Emotionally Abused:   Marland Kitchen Physically Abused:   . Sexually Abused:       PHYSICAL EXAM  Vitals:   02/14/20 1236  BP: (!) 149/79  Pulse: 71  Temp: 98.3 F (36.8 C)  Weight: 213 lb (96.6 kg)  Height: 5\' 11"   (1.803 m)   Body mass index is 29.71 kg/m.  Generalized: Well developed, in no acute distress  MMSE - Mini Mental State Exam 02/14/2020 08/15/2019  Orientation to time 4 4  Orientation to Place 5 4  Registration 3 3  Attention/ Calculation 3 4  Recall 3 3  Language- name 2 objects 2 2  Language- repeat 1 1  Language- follow 3 step command 3 3  Language- read & follow direction 1 1  Write a sentence 1 1  Copy design 1 0  Total score 27 26    Neurological examination  Mentation: Alert oriented to time, place, history taking. Follows all commands speech and language fluent, his thoughts seem to drift, difficulty remaining on topic Cranial nerve II-XII: Pupils were equal round reactive to light. Extraocular movements were full, visual field were full on confrontational test. Facial sensation and strength were normal. Head turning and shoulder shrug were normal and symmetric. Motor: The motor testing reveals 5 over 5 strength of all 4 extremities. Good symmetric motor tone is noted throughout.   Sensory: Sensory testing is intact to soft touch on all 4 extremities. No evidence of extinction is noted.  Coordination: Cerebellar testing reveals good finger-nose-finger and heel-to-shin bilaterally.  Gait and station: Gait is normal. Tandem gait is slightly unsteady. Romberg is negative. No drift is seen.  Reflexes: Deep tendon reflexes are symmetric but depressed bilaterally  DIAGNOSTIC DATA (LABS, IMAGING, TESTING) - I reviewed patient records, labs, notes, testing and imaging myself where available.  Lab Results  Component Value Date   WBC 5.6 03/03/2017   HGB 15.0 03/03/2017   HCT 43.7 03/03/2017   MCV 86.4 03/03/2017   PLT 102 (L) 03/03/2017      Component Value Date/Time   NA 140 03/03/2017 0450   NA 142 11/15/2013 1203   K 4.1 03/03/2017 0450   CL 105 03/03/2017 0450   CO2 30 03/03/2017 0450   GLUCOSE 86 03/03/2017 0450   BUN 28 (H) 03/03/2017 0450   BUN 18 11/15/2013  1203   CREATININE 1.39 (H) 03/03/2017 0450   CREATININE 1.33 (H) 10/20/2016 1127   CALCIUM 9.2 03/03/2017 0450   PROT 7.0 07/27/2013 1644   ALBUMIN 4.1 07/27/2013 1644   AST 17 07/27/2013 1644   ALT 24 07/27/2013 1644   ALKPHOS 66 07/27/2013 1644   BILITOT 0.5 07/27/2013 1644   GFRNONAA 47 (L) 03/03/2017 0450   GFRAA 54 (L) 03/03/2017 0450   Lab Results  Component Value Date   CHOL 118 03/03/2017   HDL 34 (L) 03/03/2017   LDLCALC 64 03/03/2017   TRIG 98 03/03/2017   CHOLHDL 3.5 03/03/2017   No results found for: HGBA1C No results found for: VITAMINB12 No results found for: TSH    ASSESSMENT AND PLAN 79 y.o. year old male  has a past medical history of Aortic calcification (HCC), Aortic valve sclerosis, CAD S/P percutaneous coronary angioplasty, Depression, Diabetes mellitus without complication ([place]), Diverticulosis of colon, GERD (gastroesophageal reflux disease), Hiatal hernia, History of MRSA infection, Hyperlipidemia, Hypertension, Low testosterone, OA (osteoarthritis), Palpitations, Palpitations, Sleep apnea, Vasovagal episode, and Vitamin B 12 deficiency. here with:  1.  Memory  disturbance, MMSE 27/30  He was unable to tolerate Aricept.  I will start him on the Namenda 5 mg titration.  I will reinitiate MRI of the brain, was ordered at last visit.  He has multiple risk factors for cerebrovascular disease.  He remains on Plavix.  He needs to discuss with his primary doctor about report of shortness of breath, excess mucus in the morning. He will follow-up in 6 months or sooner if needed.  I spent 30 minutes of face-to-face and non-face-to-face time with patient.  This included previsit chart review, lab review, study review, order entry, electronic health record documentation, patient education.  Butler Denmark, AGNP-C, DNP 02/14/2020, 12:57 PM [facility] [address], [address], [place] [address] [phone]

## 2020-02-14 NOTE — Patient Instructions (Addendum)
Let's start Namenda for your memory  Take 1 tablet daily for one week, then take 1 tablet twice daily for one week, then take 1 tablet in the morning and 2 in the evening for one week, then take 2 tablets twice daily  Once finish, please call for a new prescription 10 mg tablets twice daily   Will get MRI of the brain, order already placed  Memory score is stable today 27/30  See you back in 6 months   Memantine Tablets What is this medicine? MEMANTINE (MEM an teen) is used to treat dementia caused by Alzheimer's disease. This medicine may be used for other purposes; ask your health care provider or pharmacist if you have questions. COMMON BRAND NAME(S): Namenda What should I tell my health care provider before I take this medicine? They need to know if you have any of these conditions:  difficulty passing urine  kidney disease  liver disease  seizures  an unusual or allergic reaction to memantine, other medicines, foods, dyes, or preservatives  pregnant or trying to get pregnant  breast-feeding How should I use this medicine? Take this medicine by mouth with a glass of water. Follow the directions on the prescription label. You may take this medicine with or without food. Take your doses at regular intervals. Do not take your medicine more often than directed. Continue to take your medicine even if you feel better. Do not stop taking except on the advice of your doctor or health care professional. Talk to your pediatrician regarding the use of this medicine in children. Special care may be needed. Overdosage: If you think you have taken too much of this medicine contact a poison control center or emergency room at once. NOTE: This medicine is only for you. Do not share this medicine with others. What if I miss a dose? If you miss a dose, take it as soon as you can. If it is almost time for your next dose, take only that dose. Do not take double or extra doses. If you do not  take your medicine for several days, contact your health care provider. Your dose may need to be changed. What may interact with this medicine?  acetazolamide  amantadine  cimetidine  dextromethorphan  dofetilide  hydrochlorothiazide  ketamine  metformin  methazolamide  quinidine  ranitidine  sodium bicarbonate  triamterene This list may not describe all possible interactions. Give your health care provider a list of all the medicines, herbs, non-prescription drugs, or dietary supplements you use. Also tell them if you smoke, drink alcohol, or use illegal drugs. Some items may interact with your medicine. What should I watch for while using this medicine? Visit your doctor or health care professional for regular checks on your progress. Check with your doctor or health care professional if there is no improvement in your symptoms or if they get worse. You may get drowsy or dizzy. Do not drive, use machinery, or do anything that needs mental alertness until you know how this drug affects you. Do not stand or sit up quickly, especially if you are an older patient. This reduces the risk of dizzy or fainting spells. Alcohol can make you more drowsy and dizzy. Avoid alcoholic drinks. What side effects may I notice from receiving this medicine? Side effects that you should report to your doctor or health care professional as soon as possible:  allergic reactions like skin rash, itching or hives, swelling of the face, lips, or tongue  agitation or  a feeling of restlessness  depressed mood  dizziness  hallucinations  redness, blistering, peeling or loosening of the skin, including inside the mouth  seizures  vomiting Side effects that usually do not require medical attention (report to your doctor or health care professional if they continue or are bothersome):  constipation  diarrhea  headache  nausea  trouble sleeping This list may not describe all possible side  effects. Call your doctor for medical advice about side effects. You may report side effects to FDA at 1-800-FDA-1088. Where should I keep my medicine? Keep out of the reach of children. Store at room temperature between 15 degrees and 30 degrees C (59 degrees and 86 degrees F). Throw away any unused medicine after the expiration date. NOTE: This sheet is a summary. It may not cover all possible information. If you have questions about this medicine, talk to your doctor, pharmacist, or health care provider.  2020 Elsevier/Gold Standard (2013-08-08 14:10:42)

## 2020-02-14 NOTE — Progress Notes (Signed)
New order for MRI of the brain without contrast has been ordered.

## 2020-02-14 NOTE — Telephone Encounter (Signed)
[employer] medicare no auth they will reach out to the patient to schedule.

## 2020-02-14 NOTE — Progress Notes (Signed)
I have read the note, and I agree with the clinical assessment and plan.  Eryn Krejci K Jerris Fleer   

## 2020-03-13 ENCOUNTER — Other Ambulatory Visit: Payer: Self-pay

## 2020-03-13 ENCOUNTER — Ambulatory Visit
Admission: RE | Admit: 2020-03-13 | Discharge: 2020-03-13 | Disposition: A | Discharge status: Home / Self Care | Payer: Medicare Other | Source: Ambulatory Visit | Attending: Neurology | Admitting: Neurology

## 2020-03-13 DIAGNOSIS — R413 Other amnesia: Secondary | ICD-10-CM

## 2020-03-19 ENCOUNTER — Telehealth: Payer: Self-pay | Admitting: *Deleted

## 2020-03-19 NOTE — Telephone Encounter (Signed)
I called pt and relayed the MRI results to him.  He verbalized understanding.  He stated that the memantine 5mg  po daily for 2 wk she sis ok, when increased to 1 tab po bid, had a bad headache and stopped taking the medication.  I relayed that since not issue with 1 tab would recommend to restart this and I will let SS/NP know and if to retry increase again.  He verbalized understanding.

## 2020-03-19 NOTE — Telephone Encounter (Signed)
He can stay at Namenda 5 mg daily for awhile, he may retry 5 mg twice daily again in the future.

## 2020-03-19 NOTE — Telephone Encounter (Signed)
Noted  

## 2020-03-19 NOTE — Telephone Encounter (Signed)
-----   Message from Suzzanne Cloud, NP sent at 03/15/2020  8:11 AM EDT ----- MRI showed no acute findings. He is already taking Plavix, would recommend continued management for vascular risk factors with his primary doctor. Mild atrophy noted.   MRI brain (without) demonstrating: - Moderate chronic small vessel ischemic disease. Mild atrophy. - No acute findings.

## 2020-03-31 ENCOUNTER — Other Ambulatory Visit: Payer: Self-pay | Admitting: Neurology

## 2020-04-03 NOTE — Telephone Encounter (Signed)
Pt is taking 5mg  po daily at this time.

## 2020-04-05 NOTE — Telephone Encounter (Signed)
I spoke to pt and relayed about we have prescription for him with memeantine 5mg  tabs. He is taking 5mg  po bid at this time.  Tolerating well at this time.  He will increase this medication 5mg  per week until 10mg  po bid.  I refused the prescription for now.  He will call us back.  He verbalized understanding.

## 2020-04-14 ENCOUNTER — Other Ambulatory Visit: Payer: Self-pay | Admitting: Cardiology

## 2020-04-16 NOTE — Telephone Encounter (Signed)
Rx request sent to pharmacy.  

## 2020-05-10 DIAGNOSIS — N183 Chronic kidney disease, stage 3 unspecified: Secondary | ICD-10-CM | POA: Diagnosis not present

## 2020-05-10 DIAGNOSIS — E782 Mixed hyperlipidemia: Secondary | ICD-10-CM | POA: Diagnosis not present

## 2020-05-10 DIAGNOSIS — I701 Atherosclerosis of renal artery: Secondary | ICD-10-CM | POA: Diagnosis not present

## 2020-05-10 DIAGNOSIS — I2510 Atherosclerotic heart disease of native coronary artery without angina pectoris: Secondary | ICD-10-CM | POA: Diagnosis not present

## 2020-05-10 DIAGNOSIS — R413 Other amnesia: Secondary | ICD-10-CM | POA: Diagnosis not present

## 2020-06-20 ENCOUNTER — Other Ambulatory Visit: Payer: Self-pay | Admitting: Cardiology

## 2020-07-17 ENCOUNTER — Other Ambulatory Visit: Payer: Self-pay | Admitting: Cardiology

## 2020-08-14 DIAGNOSIS — R413 Other amnesia: Secondary | ICD-10-CM | POA: Diagnosis not present

## 2020-08-14 DIAGNOSIS — N183 Chronic kidney disease, stage 3 unspecified: Secondary | ICD-10-CM | POA: Diagnosis not present

## 2020-08-14 DIAGNOSIS — E782 Mixed hyperlipidemia: Secondary | ICD-10-CM | POA: Diagnosis not present

## 2020-08-14 DIAGNOSIS — I2510 Atherosclerotic heart disease of native coronary artery without angina pectoris: Secondary | ICD-10-CM | POA: Diagnosis not present

## 2020-08-14 DIAGNOSIS — R5382 Chronic fatigue, unspecified: Secondary | ICD-10-CM | POA: Diagnosis not present

## 2020-08-14 DIAGNOSIS — I701 Atherosclerosis of renal artery: Secondary | ICD-10-CM | POA: Diagnosis not present

## 2020-08-15 ENCOUNTER — Ambulatory Visit: Payer: Medicare Other | Admitting: Neurology

## 2020-08-15 NOTE — Progress Notes (Deleted)
PATIENT: Johnny Kane DOB: 1941-09-03  REASON FOR VISIT: follow up HISTORY FROM: patient  HISTORY OF PRESENT ILLNESS: Today 08/15/20 Mr. Margart Sickles is an 79 year old male with history of memory problem.  Could not tolerate Aricept due to nausea.  He lives alone.  MRI of the brain showed moderate chronic small vessel ischemic disease, mild atrophy, remains on Plavix. HISTORY 02/14/2020 SS: Mr. Toomey 79 year old male with history of memory problem.  He was unable to tolerate Aricept due to nausea.  He feels his memory is stable, mostly notices memory difficulty misplacing things around the house.  He lives alone, he is separated from his wife.  He has 3 dogs to keep him company.  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  He tries to be up by 12 noon, tries to be asleep by 4 AM.  He is on Provigil for daytime drowsiness.  He drives a car, he does use GPS regularly.  He reports adequately managing his medications, finances, and household affairs.  He talks about feeling that he is not having oxygen, waking up in the morning with excessive mucus in his throat and lungs.  At last visit, he was sent for MRI, but this was not completed.  He is taking Prevagen daily.  He says he has had fairly recent sleep evaluation that showed only mild apnea, not enough to treat.  He presents today for evaluation unaccompanied.    REVIEW OF SYSTEMS: Out of a complete 14 system review of symptoms, the patient complains only of the following symptoms, and all other reviewed systems are negative.  ALLERGIES: Allergies  Allergen Reactions  . Ciprofloxacin Hives and Itching  . Donepezil     Could not tolerate  . Esomeprazole Magnesium Other (See Comments)    Pt does not recall a reaction to Nexium  . Penicillins Swelling    Hands and facial swelling   Has patient had a PCN reaction causing immediate rash, facial/tongue/throat swelling, SOB or lightheadedness with hypotension:  no Has patient had a PCN reaction causing severe rash involving mucus membranes or skin necrosis: {no Has patient had a PCN reaction that required hospitalization {no Has patient had a PCN reaction occurring within the last 10 years: {no If all of the above answers are "NO", then may proceed with Cephalosporin use.  Manuela Neptune [Carisoprodol] Rash    Looks like measles  . Sulfa Drugs Cross Reactors Other (See Comments)    Unknown childhood allergic reaction    HOME MEDICATIONS: Outpatient Medications Prior to Visit  Medication Sig Dispense Refill  . acetaminophen (TYLENOL) 325 MG tablet Take 2 tablets (650 mg total) by mouth every 4 (four) hours as needed for headache or mild pain. (Patient taking differently: Take 325 mg by mouth daily as needed (pain). )    . busPIRone (BUSPAR) 10 MG tablet Take 1 tablet by mouth 3 (three) times daily.   1  . clindamycin (CLEOCIN) 150 MG capsule Take 600 mg by mouth See admin instructions. Take 4 capsules (600 mg) by mouth one hour prior to dental procedure    . clopidogrel (PLAVIX) 75 MG tablet TAKE 1 TABLET BY MOUTH EVERY DAY 30 tablet 7  . Cyanocobalamin (VITAMIN B-12) 5000 MCG TBDP Take 5,000 mcg by mouth daily after lunch.    . escitalopram (LEXAPRO) 20 MG tablet Take 20 mg by mouth daily.  5  . famotidine (PEPCID) 20 MG tablet Take 20 mg by mouth See admin  instructions. Take 1 tablet (20 mg) by mouth twice daily - after lunch and at bedtime  3  . furosemide (LASIX) 20 MG tablet TAKE 1 TABLET BY MOUTH  DAILY 90 tablet 3  . Guaifenesin (MUCINEX MAXIMUM STRENGTH) 1200 MG TB12 Take 1,200 mg by mouth daily as needed (congestion).    . isosorbide mononitrate (IMDUR) 60 MG 24 hr tablet TAKE 1 TABLET BY MOUTH  TWICE DAILY 180 tablet 3  . memantine (NAMENDA) 5 MG tablet Take 1 tablet daily for one week, then take 1 tablet twice daily for one week, then take 1 tablet in the morning and 2 in the evening for one week, then take 2 tablets twice daily; call for new rx  afterwards 150 tablet 0  . modafinil (PROVIGIL) 200 MG tablet Take 200 mg by mouth See admin instructions. Take 1 tablet (200 mg) by mouth twice daily - every morning and at 4:30pm    . nitroGLYCERIN (NITROSTAT) 0.4 MG SL tablet DISSOLVE 1 TABLET UNDER THE TONGUE EVERY 5 MINUTES AS  NEEDED FOR CHEST PAIN. MAX  OF 3 TABLETS IN 15 MINUTES. CALL 911 IF PAIN PERSISTS. 100 tablet 1  . olmesartan (BENICAR) 40 MG tablet Take a 40 mg tablet by mouth, 2 to 3 hours after waking up daily 90 tablet 3  . polyethylene glycol (MIRALAX / GLYCOLAX) packet Take 17 g by mouth daily as needed (constipation). Mix in 8 oz liquid and drink     No facility-administered medications prior to visit.    PAST MEDICAL HISTORY: Past Medical History:  Diagnosis Date  . Aortic calcification (HCC)    No evidence of AAA on CT Abd in 2013  . Aortic valve sclerosis    a. 12/2013 Echo: Aortic Sclerosis; EF 60-65%, Gr1 DD, mild AI, mildly dil LA.  Marland Kitchen CAD S/P percutaneous coronary angioplasty    a. 07/2011 Nl MV;  b. 11/2013 Cath: LM nl, LAD 20p, D1/2 nl, LCX 30p, OM1 40, OM2 nl, RCA 90-62m (3.5x18 Xience DES). c. Myoview 04/2014: No ischmia/Infarction, normal EF. d. patent stent by cath in 10/2016.  Marland Kitchen Depression   . Diabetes mellitus without complication ([place])   . Diverticulosis of colon   . GERD (gastroesophageal reflux disease)   . Hiatal hernia   . History of MRSA infection   . Hyperlipidemia   . Hypertension   . Low testosterone   . OA (osteoarthritis)   . Palpitations   . Palpitations   . Sleep apnea    a. mild not on cpap   . Vasovagal episode   . Vitamin B 12 deficiency     PAST SURGICAL HISTORY: Past Surgical History:  Procedure Laterality Date  . CARDIAC CATHETERIZATION N/A 10/23/2016   Procedure: Left Heart Cath and Coronary Angiography;  Surgeon: Leonie Man, MD;  Location: [facility];  Service: Cardiovascular:: Widely patent RCA stent and otherwise minimal CAD. EF 50-55%. Normal LVEDP  .  CHOLECYSTECTOMY N/A 07/20/2013   Procedure: LAPAROSCOPIC CHOLECYSTECTOMY;  Surgeon: Joyice Faster. Cornett, MD;  Location: [facility];  Service: General;  Laterality: N/A;  . CORONARY ANGIOPLASTY WITH STENT PLACEMENT  11/25/2013   mRCA 90-95% - 0% (3.5x18 Xience DES).  . LEFT HEART CATHETERIZATION WITH CORONARY ANGIOGRAM N/A 11/25/2013   Procedure: LEFT HEART CATHETERIZATION WITH CORONARY ANGIOGRAM;  Surgeon: Leonie Man, MD;  Location: [facility];  Service: Cardiovascular: LM nl, LAD 20p, D1/2 nl, LCX 30p, OM1 40, OM2 nl, RCA 90-61m -- DES PCI.   Marland Kitchen  NM MYOVIEW LTD  10/2016   EF 44% with diffuse hypokinesis.  Otherwise no ischemia or infarction. -->  Follow-up catheterization showed widely patent RCA stent, otherwise normal coronary arteries with EF 55% and normal LVEDP.  Marland Kitchen REPLACEMENT TOTAL KNEE     one on each knee  . TONSILLECTOMY    . TRANSTHORACIC ECHOCARDIOGRAM  February 2015   EF 60-65%, Gr1DD, mild AI with aortic sclerosis, mildly dilated LA.  Marland Kitchen TRANSTHORACIC ECHOCARDIOGRAM  03/2017   mild LVH. EF 50-55%. GR1 DD, Elevated LVEDP. Trace AI. Mild Ao root dilation.    FAMILY HISTORY: Family History  Problem Relation Age of Onset  . Asthma Mother   . Hypertension Mother   . Heart attack Father     SOCIAL HISTORY: Social History   Socioeconomic History  . Marital status: Legally Separated    Spouse name: Not on file  . Number of children: 2  . Years of education: Not on file  . Highest education level: Not on file  Occupational History  . Occupation: [employer]    Comment: retired  Tobacco Use  . Smoking status: Former Smoker    Years: 11.00    Quit date: 11/04/1967    Years since quitting: 52.8  . Smokeless tobacco: Never Used  Substance and Sexual Activity  . Alcohol use: No  . Drug use: No  . Sexual activity: Not on file  Other Topics Concern  . Not on file  Social History Narrative   Married father of 2. Retired from [employer].   Former smoker - quit in 1969 after 11 years; denies  alcohol consumption   Social Determinants of Radio broadcast assistant Strain:   . Difficulty of Paying Living Expenses: Not on file  Food Insecurity:   . Worried About Charity fundraiser in the Last Year: Not on file  . Ran Out of Food in the Last Year: Not on file  Transportation Needs:   . Lack of Transportation (Medical): Not on file  . Lack of Transportation (Non-Medical): Not on file  Physical Activity:   . Days of Exercise per Week: Not on file  . Minutes of Exercise per Session: Not on file  Stress:   . Feeling of Stress : Not on file  Social Connections:   . Frequency of Communication with Friends and Family: Not on file  . Frequency of Social Gatherings with Friends and Family: Not on file  . Attends Religious Services: Not on file  . Active Member of Clubs or Organizations: Not on file  . Attends Archivist Meetings: Not on file  . Marital Status: Not on file  Intimate Partner Violence:   . Fear of Current or Ex-Partner: Not on file  . Emotionally Abused: Not on file  . Physically Abused: Not on file  . Sexually Abused: Not on file      PHYSICAL EXAM  There were no vitals filed for this visit. There is no height or weight on file to calculate BMI.  Generalized: Well developed, in no acute distress   Neurological examination  Mentation: Alert oriented to time, place, history taking. Follows all commands speech and language fluent Cranial nerve II-XII: Pupils were equal round reactive to light. Extraocular movements were full, visual field were full on confrontational test. Facial sensation and strength were normal. Uvula tongue midline. Head turning and shoulder shrug  were normal and symmetric. Motor: The motor testing reveals 5 over 5 strength of all 4 extremities. Good symmetric  motor tone is noted throughout.  Sensory: Sensory testing is intact to soft touch on all 4 extremities. No evidence of extinction is noted.  Coordination: Cerebellar  testing reveals good finger-nose-finger and heel-to-shin bilaterally.  Gait and station: Gait is normal. Tandem gait is normal. Romberg is negative. No drift is seen.  Reflexes: Deep tendon reflexes are symmetric and normal bilaterally.   DIAGNOSTIC DATA (LABS, IMAGING, TESTING) - I reviewed patient records, labs, notes, testing and imaging myself where available.  Lab Results  Component Value Date   WBC 5.6 03/03/2017   HGB 15.0 03/03/2017   HCT 43.7 03/03/2017   MCV 86.4 03/03/2017   PLT 102 (L) 03/03/2017      Component Value Date/Time   NA 140 03/03/2017 0450   NA 142 11/15/2013 1203   K 4.1 03/03/2017 0450   CL 105 03/03/2017 0450   CO2 30 03/03/2017 0450   GLUCOSE 86 03/03/2017 0450   BUN 28 (H) 03/03/2017 0450   BUN 18 11/15/2013 1203   CREATININE 1.39 (H) 03/03/2017 0450   CREATININE 1.33 (H) 10/20/2016 1127   CALCIUM 9.2 03/03/2017 0450   PROT 7.0 07/27/2013 1644   ALBUMIN 4.1 07/27/2013 1644   AST 17 07/27/2013 1644   ALT 24 07/27/2013 1644   ALKPHOS 66 07/27/2013 1644   BILITOT 0.5 07/27/2013 1644   GFRNONAA 47 (L) 03/03/2017 0450   GFRAA 54 (L) 03/03/2017 0450   Lab Results  Component Value Date   CHOL 118 03/03/2017   HDL 34 (L) 03/03/2017   LDLCALC 64 03/03/2017   TRIG 98 03/03/2017   CHOLHDL 3.5 03/03/2017   No results found for: HGBA1C No results found for: VITAMINB12 No results found for: TSH    ASSESSMENT AND PLAN 79 y.o. year old male  has a past medical history of Aortic calcification (HCC), Aortic valve sclerosis, CAD S/P percutaneous coronary angioplasty, Depression, Diabetes mellitus without complication ([place]), Diverticulosis of colon, GERD (gastroesophageal reflux disease), Hiatal hernia, History of MRSA infection, Hyperlipidemia, Hypertension, Low testosterone, OA (osteoarthritis), Palpitations, Palpitations, Sleep apnea, Vasovagal episode, and Vitamin B 12 deficiency. here with ***   I spent 15 minutes with the patient. 50% of this time  was spent   Butler Denmark, [place], DNP 08/15/2020, 6:05 AM [facility] [address] [place], [place] [address] [phone]

## 2020-09-06 ENCOUNTER — Other Ambulatory Visit: Payer: Self-pay | Admitting: Cardiology

## 2020-09-11 ENCOUNTER — Encounter: Payer: Self-pay | Admitting: Cardiology

## 2020-09-11 ENCOUNTER — Ambulatory Visit: Payer: Medicare Other | Admitting: Cardiology

## 2020-09-11 VITALS — BP 128/64 | HR 85 | Ht 71.0 in | Wt 212.2 lb

## 2020-09-11 DIAGNOSIS — E669 Obesity, unspecified: Secondary | ICD-10-CM

## 2020-09-11 DIAGNOSIS — E785 Hyperlipidemia, unspecified: Secondary | ICD-10-CM

## 2020-09-11 DIAGNOSIS — I10 Essential (primary) hypertension: Secondary | ICD-10-CM

## 2020-09-11 DIAGNOSIS — I2510 Atherosclerotic heart disease of native coronary artery without angina pectoris: Secondary | ICD-10-CM | POA: Diagnosis not present

## 2020-09-11 DIAGNOSIS — Z9861 Coronary angioplasty status: Secondary | ICD-10-CM | POA: Diagnosis not present

## 2020-09-11 DIAGNOSIS — R413 Other amnesia: Secondary | ICD-10-CM | POA: Diagnosis not present

## 2020-09-11 DIAGNOSIS — R002 Palpitations: Secondary | ICD-10-CM

## 2020-09-11 DIAGNOSIS — I493 Ventricular premature depolarization: Secondary | ICD-10-CM

## 2020-09-11 NOTE — Patient Instructions (Signed)

## 2020-09-11 NOTE — Progress Notes (Signed)
Primary Care Provider: Helen Hashimoto., MD Cardiologist: Glenetta Hew, MD Electrophysiologist: None  Clinic Note: Chief Complaint  Patient presents with  . Follow-up    1 year  . Coronary Artery Disease    No angina   HPI:    Johnny Kane is a 79 y.o. male with a CAD intermittent palpitations along with memory loss/dementia described below who presents today for annual follow-up.  Problem List Items Addressed This Visit    CAD S/P percutaneous coronary angioplasty - RCA DES 11/25/13 - Primary (Chronic)   Essential hypertension (Chronic)   Hyperlipidemia with target LDL less than 70 (Chronic)   Memory loss or impairment (Chronic)   Symptomatic PVCs (Chronic)   Obesity (BMI 30-39.9) (Chronic)   Heart palpitations    Pretty stable.  Not really having any spells.  Nothing did not require as needed dosing of beta-blocker for instance.[name] was last seen on September 12, 2019.  Nursing noted his legs aching whenever he does things.  A little bit more depressed than usual.  Does not really have energy or desire to get up and go. Remains very tangential.  Recent Hospitalizations: n/a  Reviewed  CV studies:    The following studies were reviewed today: (if available, images/films reviewed: From Epic Chart or Care Everywhere) . Lower Extremity ABI-September 27, 2019: Normal ABIs.  Interval History:   [name] returns today overall doing pretty well.  He said he had been exercising more and he got his weight all the way down to 200 pounds, but he has slacked off a little bit and gained some weight back.  As usual, he tends to go off on tangents and stories.  He has these weird spells still of just not feeling right, foggy headed for which he takes his nitroglycerin 1 or 2 this.  If not chest discomfort, but the nitroglycerin seems to help.  He denies any episodes of chest pain.  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 He has had a couple times when he is felt little short of breath and he checked his oxygen saturations and gone down to the low 90s.  Not associated with coughing or PND, orthopnea.  Just feeling tired  He now has a new platonic lady friend, and stays occupied in trying to help coordinate her clinic visits and other important tasks.  He is pretty much estranged now from both his ex-wife and even his daughter.  He is okay with that, indicating that the stress of being with his ex-wife was probably too much for him.  Besides a couple episodes of shortness of breath and low oxygen levels.  He is not had any PND, orthopnea or edema.  CV Review of Symptoms (Summary): positive for - chest pain, edema, orthopnea, paroxysmal nocturnal dyspnea, rapid heart rate and Intermittent episodes of shortness of breath with brief episodes of borderline hypoxia on home monitor.  Also, intermittent episodes of altered mental state/foggy headedness that clears with nitroglycerin. negative for - chest pain, dyspnea on exertion, edema, orthopnea, paroxysmal nocturnal dyspnea, rapid heart rate or Syncope/near syncope, TIA/amaurosis fugax, claudication  The patient does not have symptoms concerning for COVID-19 infection (fever, chills, cough, or new shortness of breath).   REVIEWED OF SYSTEMS   Review of Systems  Constitutional: Positive for malaise/fatigue (Still due to decrease in the endurance level.  Strange sleeping habits sleeps late in the day and stays up late in the night.). Negative for weight loss (Unfortunately gained back of the weight he lost).  HENT: Negative for congestion and nosebleeds.   Respiratory: Positive for shortness of breath. Negative for cough.   Cardiovascular: Negative for claudication.  Gastrointestinal: Negative for blood in stool and melena.       He says he is "regular "  Genitourinary: Negative for frequency, hematuria and urgency.       Scrotal tenderness-is  planning to see urology  Musculoskeletal: Positive for back pain and joint pain (Hips and knees mostly).  Neurological: Positive for dizziness, tingling (Peripheral neuropathy), weakness (Legs sometimes give out on him.) and headaches (Off and on). Negative for focal weakness.  Psychiatric/Behavioral: Positive for memory loss. Negative for depression. The patient has insomnia. The patient is not nervous/anxious.        Occasional episodes of feeling confused/foggy headed usually relieved with nitroglycerin.   I have reviewed and (if needed) personally updated the patient's problem list, medications, allergies, past medical and surgical history, social and family history.   PAST MEDICAL HISTORY   Past Medical History:  Diagnosis Date  . Aortic calcification (HCC)    No evidence of AAA on CT Abd in 2013  . Aortic valve sclerosis    a. 12/2013 Echo: Aortic Sclerosis; EF 60-65%, Gr1 DD, mild AI, mildly dil LA.  Marland Kitchen CAD S/P percutaneous coronary angioplasty    a. 07/2011 Nl MV;  b. 11/2013 Cath: LM nl, LAD 20p, D1/2 nl, LCX 30p, OM1 40, OM2 nl, RCA 90-56m (3.5x18 Xience DES). c. Myoview 04/2014: No ischmia/Infarction, normal EF. d. patent stent by cath in 10/2016.  Marland Kitchen Depression   . Diabetes mellitus without complication ([place])   . Diverticulosis of colon   . GERD (gastroesophageal reflux disease)   . Hiatal hernia   . History of MRSA infection   . Hyperlipidemia   . Hypertension   . Low testosterone   . OA (osteoarthritis)   . Palpitations   . Palpitations   . Sleep apnea    a. mild not on cpap   . Vasovagal episode   . Vitamin B 12 deficiency     PAST SURGICAL HISTORY   Past Surgical History:  Procedure Laterality Date  . CARDIAC CATHETERIZATION N/A 10/23/2016   Procedure: Left Heart Cath and Coronary Angiography;  Surgeon: Leonie Man, MD;  Location: [facility];  Service: Cardiovascular:: Widely patent RCA stent and otherwise minimal CAD. EF 50-55%. Normal LVEDP  .  CHOLECYSTECTOMY N/A 07/20/2013   Procedure: LAPAROSCOPIC CHOLECYSTECTOMY;  Surgeon: Joyice Faster. Cornett, MD;  Location: [facility];  Service: General;  Laterality: N/A;  . CORONARY ANGIOPLASTY WITH STENT PLACEMENT  11/25/2013   mRCA 90-95% - 0% (3.5x18 Xience DES).  . LEFT HEART CATHETERIZATION WITH CORONARY ANGIOGRAM N/A 11/25/2013   Procedure: LEFT HEART CATHETERIZATION WITH CORONARY ANGIOGRAM;  Surgeon: Leonie Man, MD;  Location[facility];  Service: Cardiovascular: LM nl, LAD 20p, D1/2 nl, LCX 30p, OM1 40, OM2 nl, RCA 90-43m -- DES PCI.   Marland Kitchen NM MYOVIEW LTD  10/2016   EF 44% with diffuse hypokinesis.  Otherwise no ischemia or infarction. -->  Follow-up catheterization showed widely patent RCA stent, otherwise normal coronary arteries with EF 55% and normal LVEDP.  Marland Kitchen REPLACEMENT TOTAL KNEE     one on each knee  . TONSILLECTOMY    . TRANSTHORACIC ECHOCARDIOGRAM  February 2015   EF 60-65%,  Gr1DD, mild AI with aortic sclerosis, mildly dilated LA.  Marland Kitchen TRANSTHORACIC ECHOCARDIOGRAM  03/2017   mild LVH. EF 50-55%. GR1 DD, Elevated LVEDP. Trace AI. Mild Ao root dilation.    Cardiac catheterization 10/23/2016: Widely patent RCA stent and otherwise minimal CAD. EF 50-55%.Normal LVEDP    There is no immunization history on file for this patient.  MEDICATIONS/ALLERGIES   Current Meds  Medication Sig  . busPIRone (BUSPAR) 10 MG tablet Take 1 tablet by mouth 3 (three) times daily.   . clindamycin (CLEOCIN) 150 MG capsule Take 600 mg by mouth See admin instructions. Take 4 capsules (600 mg) by mouth one hour prior to dental procedure  . clopidogrel (PLAVIX) 75 MG tablet TAKE 1 TABLET BY MOUTH EVERY DAY  . Cyanocobalamin (VITAMIN B-12) 5000 MCG TBDP Take 5,000 mcg by mouth daily after lunch.  . escitalopram (LEXAPRO) 20 MG tablet Take 20 mg by mouth daily.  . famotidine (PEPCID) 20 MG tablet Take 20 mg by mouth See admin instructions. Take 1 tablet (20 mg) by mouth twice daily - after lunch and at  bedtime  . furosemide (LASIX) 20 MG tablet TAKE 1 TABLET BY MOUTH  DAILY  . Guaifenesin (MUCINEX MAXIMUM STRENGTH) 1200 MG TB12 Take 1,200 mg by mouth daily as needed (congestion).  . isosorbide mononitrate (IMDUR) 60 MG 24 hr tablet TAKE 1 TABLET BY MOUTH  TWICE DAILY  . modafinil (PROVIGIL) 200 MG tablet Take 200 mg by mouth See admin instructions. Take 1 tablet (200 mg) by mouth twice daily - every morning and at 4:30pm  . nitroGLYCERIN (NITROSTAT) 0.4 MG SL tablet DISSOLVE 1 TABLET UNDER THE TONGUE EVERY 5 MINUTES AS  NEEDED FOR CHEST PAIN. MAX  OF 3 TABLETS IN 15 MINUTES. CALL 911 IF PAIN PERSISTS.  Marland Kitchen olmesartan (BENICAR) 40 MG tablet TAKE 1 TABLET BY MOUTH 2 TO 3 HOURS AFTER WAKING UP  DAILY  . polyethylene glycol (MIRALAX / GLYCOLAX) packet Take 17 g by mouth daily as needed (constipation). Mix in 8 oz liquid and drink    Allergies  Allergen Reactions  . Ciprofloxacin Hives and Itching  . Donepezil     Could not tolerate  . Esomeprazole Magnesium Other (See Comments)    Pt does not recall a reaction to Nexium  . Penicillins Swelling    Hands and facial swelling   Has patient had a PCN reaction causing immediate rash, facial/tongue/throat swelling, SOB or lightheadedness with hypotension: no Has patient had a PCN reaction causing severe rash involving mucus membranes or skin necrosis: {no Has patient had a PCN reaction that required hospitalization {no Has patient had a PCN reaction occurring within the last 10 years: {no If all of the above answers are "NO", then may proceed with Cephalosporin use.  Manuela Neptune [Carisoprodol] Rash    Looks like measles  . Sulfa Drugs Cross Reactors Other (See Comments)    Unknown childhood allergic reaction    SOCIAL HISTORY/FAMILY HISTORY   Reviewed in Epic:  Pertinent findings: [place] farm - alone.  1[place]2 is [place].  Has several dogs (that are essentially therapeutic).  Still estranged from wife - has strained relationship with daughter     OBJCTIVE -PE, EKG, labs   Wt Readings from Last 3 Encounters:  09/11/20 212 lb 3.2 oz (96.3 kg)  02/14/20 213 lb (96.6 kg)  09/12/19 218 lb 6.4 oz (99.1 kg)    Physical Exam: BP 128/64   Pulse 85   Ht 5\' 11"  (1.803  m)   Wt 212 lb 3.2 oz (96.3 kg)   SpO2 94%   BMI 29.60 kg/m  Physical Exam Constitutional:      General: He is not in acute distress.    Appearance: Normal appearance. He is normal weight. He is not ill-appearing.     Comments: Well-groomed.  Borderline obese.  Wears bifocals for down his nose.  HENT:     Head: Normocephalic and atraumatic.  Neck:     Vascular: No carotid bruit.  Cardiovascular:     Rate and Rhythm: Normal rate and regular rhythm.     Pulses: Normal pulses.     Heart sounds: No murmur heard.  No friction rub. No gallop.      Comments: Split S2 from RBBB Pulmonary:     Effort: Pulmonary effort is normal. No respiratory distress.     Breath sounds: Normal breath sounds.  Chest:     Chest wall: No tenderness.  Abdominal:     General: Bowel sounds are normal.     Palpations: Abdomen is soft. There is mass.     Hernia: No hernia is present.  Musculoskeletal:        General: No swelling. Normal range of motion.     Cervical back: Normal range of motion.  Skin:    General: Skin is warm and dry.  Neurological:     General: No focal deficit present.     Mental Status: He is alert and oriented to person, place, and time.  Psychiatric:        Mood and Affect: Mood normal.        Behavior: Behavior normal.        Judgment: Judgment normal.     Comments: Somewhat circuitous tangential thought process.      Adult ECG Report  Rate: 85;  Rhythm: normal sinus rhythm and RBBB.  Repolarization changes.;  Stable.;   Narrative Interpretation: Stable EKG  Recent Labs: 08/14/2020: TC 189, TG 217, HDL 36, LDL 115.  (No longer on statin because of memory issues. Lab Results  Component Value Date   CHOL 118 03/03/2017   HDL 34 (L) 03/03/2017    LDLCALC 64 03/03/2017   TRIG 98 03/03/2017   CHOLHDL 3.5 03/03/2017   Lab Results  Component Value Date   CREATININE 1.39 (H) 03/03/2017   BUN 28 (H) 03/03/2017   NA 140 03/03/2017   K 4.1 03/03/2017   CL 105 03/03/2017   CO2 30 03/03/2017   No results found for: TSH  ASSESSMENT/PLAN   Problem List Items Addressed This Visit    CAD S/P percutaneous coronary angioplasty - RCA DES 11/25/13 - Primary (Chronic)    No further anginal symptoms.  We are using high-dose Imdur more because of his memory issues and trying to avoid as needed nitroglycerin use. He is no longer on ARB.  Not on beta-blocker because of fatigue and hypotension.  No statin because of memory issues. Is on maintenance dose Plavix.  Aspirin stopped because of bruising.-  Okay to hold Plavix preop for surgeries or procedures.      Relevant Orders   EKG 12-Lead (Completed)   Essential hypertension (Chronic)    Stable blood pressure.  No longer on Benicar.  He does better with a little bit mild permissive hypertension as opposed to lower pressures.      Hyperlipidemia with target LDL less than 70 (Chronic)    His LDL went up on 40 not being on statin or any other  medication.  I think we may need to consider Nexletol.  We talked about adding something and he was reluctant to try.      Memory loss or impairment (Chronic)    6 clearly has early stages of dementia.  He is taking Provigil to help stay awake, takes nitroglycerin and Imdur to help clear his mind from "fogginess ".  We stopped statin to avoid memory issues.  He is now elected to restart.      Symptomatic PVCs (Chronic)    No longer bothering him.  Not on beta-blocker.      Obesity (BMI 30-39.9) (Chronic)    He is trying to stay active, but does not do a whole lot of physical exertional activity.  Pretty deconditioned.  Borderline obese, but we talked importance of monitoring his diet and trying to avoid regaining the weight that he has lost.       Heart palpitations    Pretty stable.  Not really having any spells.  Nothing did not require as needed dosing of beta-blocker for instance.Marland Kitchen          COVID-19 Education: The signs and symptoms of COVID-19 were discussed with the patient and how to seek care for testing (follow up with PCP or arrange E-visit).   The importance of social distancing and COVID-19 vaccination was discussed today. 1 min The patient is practicing social distancing & Masking.   I spent a total of 33 minutes with the patient spent in direct patient consultation.  Additional time spent with chart review  / charting (studies, outside notes, etc): 8 Total Time: 41 min   Current medicines are reviewed at length with the patient today.  (+/- concerns) none  This visit occurred during the SARS-CoV-2 public health emergency.  Safety protocols were in place, including screening questions prior to the visit, additional usage of staff PPE, and extensive cleaning of exam room while observing appropriate contact time as indicated for disinfecting solutions.  Notice: This dictation was prepared with Dragon dictation along with smaller phrase technology. Any transcriptional errors that result from this process are unintentional and may not be corrected upon review.  Patient Instructions / Medication Changes & Studies & Tests Ordered   Patient Instructions  Medication Instructions:  NO CHANGES  *If you need a refill on your cardiac medications before your next appointment, please call your pharmacy*   Lab Work:  NOT NEEDED   Testing/Procedures:  NOT NEEDED  Follow-Up: At [facility], you and your health needs are our priority.  As part of our continuing mission to provide you with exceptional heart care, we have created designated Provider Care Teams.  These Care Teams include your primary Cardiologist (physician) and Advanced Practice Providers (APPs -  Physician Assistants and Nurse Practitioners) who all  work together to provide you with the care you need, when you need it.     Your next appointment:   12 month(s)  The format for your next appointment:   In Person  Provider:   Glenetta Hew, MD      Studies Ordered:   Orders Placed This Encounter  Procedures  . EKG 12-Lead     Glenetta Hew, M.D., M.S. Interventional Cardiologist   Pager # [phone] Phone # [phone] [address]. [place], [place] [address]   Thank you for choosing [facility]!!

## 2020-09-22 ENCOUNTER — Encounter: Payer: Self-pay | Admitting: Cardiology

## 2020-09-22 NOTE — Assessment & Plan Note (Signed)
Stable blood pressure.  No longer on Benicar.  He does better with a little bit mild permissive hypertension as opposed to lower pressures.

## 2020-09-22 NOTE — Assessment & Plan Note (Signed)
He is trying to stay active, but does not do a whole lot of physical exertional activity.  Pretty deconditioned.  Borderline obese, but we talked importance of monitoring his diet and trying to avoid regaining the weight that he has lost.

## 2020-09-22 NOTE — Assessment & Plan Note (Signed)
No longer bothering him.  Not on beta-blocker.

## 2020-09-22 NOTE — Assessment & Plan Note (Signed)
6 clearly has early stages of dementia.  Johnny Kane is taking Provigil to help stay awake, takes nitroglycerin and Imdur to help clear his mind from "fogginess ".  We stopped statin to avoid memory issues.  Johnny Kane is now elected to restart.

## 2020-09-22 NOTE — Assessment & Plan Note (Signed)
Pretty stable.  Not really having any spells.  Nothing did not require as needed dosing of beta-blocker for instance.Johnny Kane

## 2020-09-22 NOTE — Assessment & Plan Note (Addendum)
No further anginal symptoms.  We are using high-dose Imdur more because of his memory issues and trying to avoid as needed nitroglycerin use. He is no longer on ARB.  Not on beta-blocker because of fatigue and hypotension.  No statin because of memory issues. Is on maintenance dose Plavix.  Aspirin stopped because of bruising.-  Okay to hold Plavix preop for surgeries or procedures.

## 2020-09-22 NOTE — Assessment & Plan Note (Signed)
His LDL went up on 40 not being on statin or any other medication.  I think we may need to consider Nexletol.  We talked about adding something and he was reluctant to try.

## 2020-10-24 ENCOUNTER — Other Ambulatory Visit: Payer: Self-pay | Admitting: Cardiology

## 2020-11-05 ENCOUNTER — Ambulatory Visit: Payer: Medicare Other | Admitting: Neurology

## 2020-11-12 DIAGNOSIS — E782 Mixed hyperlipidemia: Secondary | ICD-10-CM | POA: Diagnosis not present

## 2020-11-12 DIAGNOSIS — N183 Chronic kidney disease, stage 3 unspecified: Secondary | ICD-10-CM | POA: Diagnosis not present

## 2020-11-19 ENCOUNTER — Other Ambulatory Visit: Payer: Self-pay | Admitting: Cardiology

## 2020-12-17 DIAGNOSIS — K219 Gastro-esophageal reflux disease without esophagitis: Secondary | ICD-10-CM | POA: Diagnosis not present

## 2020-12-17 DIAGNOSIS — E782 Mixed hyperlipidemia: Secondary | ICD-10-CM | POA: Diagnosis not present

## 2020-12-17 DIAGNOSIS — I2510 Atherosclerotic heart disease of native coronary artery without angina pectoris: Secondary | ICD-10-CM | POA: Diagnosis not present

## 2020-12-17 DIAGNOSIS — I701 Atherosclerosis of renal artery: Secondary | ICD-10-CM | POA: Diagnosis not present

## 2020-12-17 DIAGNOSIS — R5382 Chronic fatigue, unspecified: Secondary | ICD-10-CM | POA: Diagnosis not present

## 2020-12-17 DIAGNOSIS — I10 Essential (primary) hypertension: Secondary | ICD-10-CM | POA: Diagnosis not present

## 2020-12-17 DIAGNOSIS — R413 Other amnesia: Secondary | ICD-10-CM | POA: Diagnosis not present

## 2020-12-17 DIAGNOSIS — R053 Chronic cough: Secondary | ICD-10-CM | POA: Diagnosis not present

## 2020-12-17 DIAGNOSIS — N183 Chronic kidney disease, stage 3 unspecified: Secondary | ICD-10-CM | POA: Diagnosis not present

## 2020-12-17 DIAGNOSIS — F32A Depression, unspecified: Secondary | ICD-10-CM | POA: Diagnosis not present

## 2021-01-14 DIAGNOSIS — Z23 Encounter for immunization: Secondary | ICD-10-CM | POA: Diagnosis not present

## 2021-01-14 DIAGNOSIS — S61411A Laceration without foreign body of right hand, initial encounter: Secondary | ICD-10-CM | POA: Diagnosis not present

## 2021-01-14 DIAGNOSIS — L089 Local infection of the skin and subcutaneous tissue, unspecified: Secondary | ICD-10-CM | POA: Diagnosis not present

## 2021-02-12 DIAGNOSIS — E119 Type 2 diabetes mellitus without complications: Secondary | ICD-10-CM | POA: Diagnosis not present

## 2021-02-12 DIAGNOSIS — H40033 Anatomical narrow angle, bilateral: Secondary | ICD-10-CM | POA: Diagnosis not present

## 2021-04-04 DIAGNOSIS — B957 Other staphylococcus as the cause of diseases classified elsewhere: Secondary | ICD-10-CM | POA: Diagnosis not present

## 2021-04-04 DIAGNOSIS — L0103 Bullous impetigo: Secondary | ICD-10-CM | POA: Diagnosis not present

## 2021-05-08 ENCOUNTER — Other Ambulatory Visit: Payer: Self-pay | Admitting: Cardiology

## 2021-06-17 DIAGNOSIS — N183 Chronic kidney disease, stage 3 unspecified: Secondary | ICD-10-CM | POA: Diagnosis not present

## 2021-06-17 DIAGNOSIS — E782 Mixed hyperlipidemia: Secondary | ICD-10-CM | POA: Diagnosis not present

## 2021-06-20 ENCOUNTER — Other Ambulatory Visit: Payer: Self-pay | Admitting: Cardiology

## 2021-06-20 DIAGNOSIS — K219 Gastro-esophageal reflux disease without esophagitis: Secondary | ICD-10-CM | POA: Diagnosis not present

## 2021-06-20 DIAGNOSIS — I701 Atherosclerosis of renal artery: Secondary | ICD-10-CM | POA: Diagnosis not present

## 2021-06-20 DIAGNOSIS — I10 Essential (primary) hypertension: Secondary | ICD-10-CM | POA: Diagnosis not present

## 2021-06-20 DIAGNOSIS — N183 Chronic kidney disease, stage 3 unspecified: Secondary | ICD-10-CM | POA: Diagnosis not present

## 2021-06-20 DIAGNOSIS — R5382 Chronic fatigue, unspecified: Secondary | ICD-10-CM | POA: Diagnosis not present

## 2021-06-20 DIAGNOSIS — I2510 Atherosclerotic heart disease of native coronary artery without angina pectoris: Secondary | ICD-10-CM | POA: Diagnosis not present

## 2021-06-20 DIAGNOSIS — E782 Mixed hyperlipidemia: Secondary | ICD-10-CM | POA: Diagnosis not present

## 2021-06-20 DIAGNOSIS — Z9181 History of falling: Secondary | ICD-10-CM | POA: Diagnosis not present

## 2021-06-20 DIAGNOSIS — Z139 Encounter for screening, unspecified: Secondary | ICD-10-CM | POA: Diagnosis not present

## 2021-06-20 DIAGNOSIS — R413 Other amnesia: Secondary | ICD-10-CM | POA: Diagnosis not present

## 2021-08-08 DIAGNOSIS — Z23 Encounter for immunization: Secondary | ICD-10-CM | POA: Diagnosis not present

## 2021-08-08 DIAGNOSIS — R5382 Chronic fatigue, unspecified: Secondary | ICD-10-CM | POA: Diagnosis not present

## 2021-08-10 ENCOUNTER — Other Ambulatory Visit: Payer: Self-pay | Admitting: Cardiology

## 2021-08-29 DIAGNOSIS — R413 Other amnesia: Secondary | ICD-10-CM | POA: Diagnosis not present

## 2021-08-29 DIAGNOSIS — R634 Abnormal weight loss: Secondary | ICD-10-CM | POA: Diagnosis not present

## 2021-08-29 DIAGNOSIS — R0789 Other chest pain: Secondary | ICD-10-CM | POA: Diagnosis not present

## 2021-08-29 DIAGNOSIS — R5383 Other fatigue: Secondary | ICD-10-CM | POA: Diagnosis not present

## 2021-09-26 ENCOUNTER — Other Ambulatory Visit: Payer: Self-pay | Admitting: Cardiology

## 2021-09-30 DIAGNOSIS — Z79899 Other long term (current) drug therapy: Secondary | ICD-10-CM | POA: Diagnosis not present

## 2021-09-30 DIAGNOSIS — R5383 Other fatigue: Secondary | ICD-10-CM | POA: Diagnosis not present

## 2021-09-30 DIAGNOSIS — R5382 Chronic fatigue, unspecified: Secondary | ICD-10-CM | POA: Diagnosis not present

## 2021-09-30 DIAGNOSIS — R202 Paresthesia of skin: Secondary | ICD-10-CM | POA: Diagnosis not present

## 2021-11-01 DIAGNOSIS — E538 Deficiency of other specified B group vitamins: Secondary | ICD-10-CM | POA: Diagnosis not present

## 2021-11-11 ENCOUNTER — Encounter: Payer: Self-pay | Admitting: Cardiology

## 2021-11-11 ENCOUNTER — Ambulatory Visit: Payer: Medicare Other | Admitting: Cardiology

## 2021-11-11 ENCOUNTER — Other Ambulatory Visit: Payer: Self-pay

## 2021-11-11 VITALS — BP 142/80 | HR 75 | Resp 20 | Ht 71.0 in | Wt 205.0 lb

## 2021-11-11 DIAGNOSIS — I10 Essential (primary) hypertension: Secondary | ICD-10-CM | POA: Diagnosis not present

## 2021-11-11 DIAGNOSIS — I2510 Atherosclerotic heart disease of native coronary artery without angina pectoris: Secondary | ICD-10-CM | POA: Diagnosis not present

## 2021-11-11 DIAGNOSIS — F32A Depression, unspecified: Secondary | ICD-10-CM | POA: Diagnosis not present

## 2021-11-11 DIAGNOSIS — R0609 Other forms of dyspnea: Secondary | ICD-10-CM

## 2021-11-11 DIAGNOSIS — I493 Ventricular premature depolarization: Secondary | ICD-10-CM

## 2021-11-11 DIAGNOSIS — E785 Hyperlipidemia, unspecified: Secondary | ICD-10-CM | POA: Diagnosis not present

## 2021-11-11 DIAGNOSIS — Z9861 Coronary angioplasty status: Secondary | ICD-10-CM | POA: Diagnosis not present

## 2021-11-11 DIAGNOSIS — R5383 Other fatigue: Secondary | ICD-10-CM

## 2021-11-11 DIAGNOSIS — I714 Abdominal aortic aneurysm, without rupture, unspecified: Secondary | ICD-10-CM | POA: Diagnosis not present

## 2021-11-11 NOTE — Patient Instructions (Signed)

## 2021-11-11 NOTE — Progress Notes (Signed)
Primary Care Provider: Helen Hashimoto., MD Cardiologist: Glenetta Hew, MD Electrophysiologist: None  Clinic Note: Chief Complaint  Patient presents with   Follow-up    Annual.  No major issues   Coronary Artery Disease    No further chest pain   ===================================  ASSESSMENT/PLAN   Problem List Items Addressed This Visit       Cardiology Problems   CAD S/P percutaneous coronary angioplasty - RCA DES 11/25/13 - Primary (Chronic)    No further angina on moderate dose Imdur.  This also keeps his "foggy headedness " at [place].  No longer on either beta-blocker or ARB because of fatigue and hypotension issues.  No longer on statin because of memory issues.  On maintenance dose Plavix but aspirin stopped because of bruising.  Plan: Continue Imdur 60 mg daily Continue maintenance dose Plavix 75 mg. Okay to hold Plavix 5 to 7 days preop for surgeries or procedures.      Relevant Orders   EKG 12-Lead (Completed)   AAA (abdominal aortic aneurysm) (Chronic)    Minimal ectasia in the abdominal aorta as well as thoracic aorta. Can consider AAA Doppler versus CTA Aorta at annual follow-up.      Essential hypertension (Chronic)    Blood pressure is borderline elevated today but he has not yet taken his medications today.  Not sure why.  He really is only on Imdur and furosemide at this point time. With dizziness, we are trying to avoid hypotension, allowing for permissive hypertension.      Relevant Orders   EKG 12-Lead (Completed)   Hyperlipidemia with target LDL less than 70 (Chronic)    Most recent lipids showed LDL 97 off of statin.  With his memory issues, I am reluctant to go back on statin.  He is reluctant to try new medicines as he is finally somewhat stabilized out.  We talked about the potential risks of not being aggressive with his lipids, and he understands.  As such, we will hold off on further medications.      Symptomatic PVCs (Chronic)     Not really bothering him anymore.  Not on STEMI beta-blocker.  Trying to avoid triggers        Other   DOE (dyspnea on exertion) -  (Chronic)    I suspect this is mostly deconditioning. He is on low-dose furosemide for blood pressure and edema with no PND orthopnea.  I do not think is CHF related.      Fatigue (Chronic)    Chronic issue.  No longer on beta-blocker, no longer on statin.  Continue to encourage him to stay active.      ===================================  HPI:    Johnny Kane is a 81 y.o. male with a PMH notable for CAD with intermittent palpitations as well as memory loss/dementia who presents today for annual follow-up.  [name] was last seen on September 11, 2020: Doing well.  Exercising more.  Weight was down 200 pounds, but it slacked off a little bit.  Tangential history.  Still has foggy headed spells for which she takes nitroglycerin.  New platonic lady friend that helps him out. No longer on any beta-blocker.  Not requiring PRN dosing.  Recent Hospitalizations: None  Reviewed  CV studies:    The following studies were reviewed today: (if available, images/films reviewed: From Epic Chart or Care Everywhere) None:  Interval History:   BRISTOL SOY returns here today for annual follow-up overall doing pretty  well from a cardiac standpoint.  He really does not have any major complaints other than a few skipped beats here and there and some dizziness if he stands up quickly.  He tries avoid getting the situation.  He really has not had any syncope or near syncope.   At the end of the day, he may get a little worn out, especially if he is done a lot of work.  If he pushes it when he pickle activity he will get worn out, therefore he tries to pace himself. No real swelling, no PND or orthopnea but does sleep on 2-3 pillows because of nasal drip.  Activity is now limited mostly by his knee and back pain.  CV Review of Symptoms  (Summary) Cardiovascular ROS: positive for - dyspnea on exertion, palpitations, and usually just tries to pace himself to avoid exertional dyspnea. negative for - chest pain, edema, orthopnea, paroxysmal nocturnal dyspnea, rapid heart rate, shortness of breath, or syncope/near syncope, TIA/[place], claudication  REVIEWED OF SYSTEMS   Review of Systems  Constitutional:  Positive for malaise/fatigue (Occasionally feels himself getting worn out during the end of day, also if he overdoes it.) and weight loss (Not really trying; just not eating as much.  Has lost his sense of taste.  Thinks it may have been from [place].).  HENT:  Positive for congestion. Negative for sinus pain.        Chronic postnasal drip  Respiratory:  Negative for cough, shortness of breath and wheezing.        Sleeps on 2-3 pillows at night because of postnasal drip and cough.  Cardiovascular:        Per HPI  Gastrointestinal:  Positive for heartburn. Negative for blood in stool and melena.       Decreased p.o. intake.  Loss of taste.  Genitourinary:  Negative for dysuria and hematuria.  Musculoskeletal:  Positive for back pain and joint pain (Knees are starting to bother him a lot.  Limiting his walking.). Negative for myalgias.  Neurological:  Positive for dizziness (Notably less dizzy.  He does avoid triggers that would cause it.  Does not stand up very quickly.), tingling (Peripheral neuropathy) and headaches (Random). Negative for weakness.       Still has episodes of feeling confused/foggy but less prominent.  Endo/Heme/Allergies:  Bruises/bleeds easily.  Psychiatric/Behavioral:  Positive for memory loss. Negative for depression (At least dysthymia.). The patient has insomnia. The patient is not nervous/anxious.    I have reviewed and (if needed) personally updated the patient's problem list, medications, allergies, past medical and surgical history, social and family history.   PAST MEDICAL HISTORY   Past Medical  History:  Diagnosis Date   Aortic calcification (HCC)    No evidence of AAA on CT Abd in 2013   Aortic valve sclerosis    a. 12/2013 Echo: Aortic Sclerosis; EF 60-65%, Gr1 DD, mild AI, mildly dil LA.   CAD S/P percutaneous coronary angioplasty    a. 07/2011 Nl MV;  b. 11/2013 Cath: LM nl, LAD 20p, D1/2 nl, LCX 30p, OM1 40, OM2 nl, RCA 90-25m (3.5x18 Xience DES). c. Myoview 04/2014: No ischmia/Infarction, normal EF. d. patent stent by cath in 10/2016.   Depression    Diabetes mellitus without complication ([place])    Diverticulosis of colon    GERD (gastroesophageal reflux disease)    Hiatal hernia    History of MRSA infection    Hyperlipidemia    Hypertension    Low testosterone  OA (osteoarthritis)    Palpitations    Palpitations    Sleep apnea    a. mild not on cpap    Vasovagal episode    Vitamin B 12 deficiency     PAST SURGICAL HISTORY   Past Surgical History:  Procedure Laterality Date   CARDIAC CATHETERIZATION N/A 10/23/2016   Procedure: Left Heart Cath and Coronary Angiography;  Surgeon: Leonie Man, MD;  Location: [facility];  Service: Cardiovascular:: Widely patent RCA stent and otherwise minimal CAD. EF 50-55%. Normal LVEDP   CHOLECYSTECTOMY N/A 07/20/2013   Procedure: LAPAROSCOPIC CHOLECYSTECTOMY;  Surgeon: Joyice Faster. Cornett, MD;  Location: [facility];  Service: General;  Laterality: N/A;   CORONARY ANGIOPLASTY WITH STENT PLACEMENT  11/25/2013   mRCA 90-95% - 0% (3.5x18 Xience DES).   LEFT HEART CATHETERIZATION WITH CORONARY ANGIOGRAM N/A 11/25/2013   Procedure: LEFT HEART CATHETERIZATION WITH CORONARY ANGIOGRAM;  Surgeon: Leonie Man, MD;  Location: [facility];  Service: Cardiovascular: LM nl, LAD 20p, D1/2 nl, LCX 30p, OM1 40, OM2 nl, RCA 90-81m -- DES PCI.    NM MYOVIEW LTD  10/2016   EF 44% with diffuse hypokinesis.  Otherwise no ischemia or infarction. -->  Follow-up catheterization showed widely patent RCA stent, otherwise normal coronary arteries with  EF 55% and normal LVEDP.   REPLACEMENT TOTAL KNEE     one on each knee   TONSILLECTOMY     TRANSTHORACIC ECHOCARDIOGRAM  February 2015   EF 60-65%, Gr1DD, mild AI with aortic sclerosis, mildly dilated LA.   TRANSTHORACIC ECHOCARDIOGRAM  03/2017   mild LVH. EF 50-55%. GR1 DD, Elevated LVEDP. Trace AI. Mild Ao root dilation.   Cardiac catheterization 10/23/2016: Widely patent RCA stent and otherwise minimal CAD. EF 50-55%. Normal LVEDP     There is no immunization history on file for this patient.  MEDICATIONS/ALLERGIES   Current Meds  Medication Sig   busPIRone (BUSPAR) 10 MG tablet Take 1 tablet by mouth 3 (three) times daily.    clopidogrel (PLAVIX) 75 MG tablet TAKE 1 TABLET BY MOUTH  DAILY   Cyanocobalamin (VITAMIN B-12) 5000 MCG TBDP Take 5,000 mcg by mouth daily after lunch.   escitalopram (LEXAPRO) 20 MG tablet Take 20 mg by mouth daily.   famotidine (PEPCID) 20 MG tablet Take 20 mg by mouth See admin instructions. Take 1 tablet (20 mg) by mouth twice daily - after lunch and at bedtime   furosemide (LASIX) 20 MG tablet TAKE 1 TABLET BY MOUTH  DAILY   Guaifenesin 1200 MG TB12 Take 1,200 mg by mouth daily as needed (congestion).   modafinil (PROVIGIL) 200 MG tablet Take 200 mg by mouth See admin instructions. Take 1 tablet (200 mg) by mouth twice daily - every morning and at 4:30pm   nitroGLYCERIN (NITROSTAT) 0.4 MG SL tablet DISSOLVE 1 TABLET UNDER THE TONGUE EVERY 5 MINUTES AS  NEEDED FOR CHEST PAIN. MAX  OF 3 TABLETS IN 15 MINUTES. CALL 911 IF PAIN PERSISTS.   polyethylene glycol (MIRALAX / GLYCOLAX) packet Take 17 g by mouth daily as needed (constipation). Mix in 8 oz liquid and drink   [DISCONTINUED] isosorbide mononitrate (IMDUR) 60 MG 24 hr tablet TAKE 1 TABLET BY MOUTH  TWICE DAILY    Allergies  Allergen Reactions   Ciprofloxacin Hives and Itching   Donepezil     Could not tolerate   Esomeprazole Magnesium Other (See Comments)    Pt does not recall a reaction to  Nexium  Penicillins Swelling    Hands and facial swelling   Has patient had a PCN reaction causing immediate rash, facial/tongue/throat swelling, SOB or lightheadedness with hypotension: no Has patient had a PCN reaction causing severe rash involving mucus membranes or skin necrosis: {no Has patient had a PCN reaction that required hospitalization {no Has patient had a PCN reaction occurring within the last 10 years: {no If all of the above answers are "NO", then may proceed with Cephalosporin use.   Soma [Carisoprodol] Rash    Looks like measles   Sulfa Drugs Administrator Other (See Comments)    Unknown childhood allergic reaction    SOCIAL HISTORY/FAMILY HISTORY   Reviewed in Epic:  Pertinent findings:  Social History   Tobacco Use   Smoking status: Former    Years: 11.00    Types: Cigarettes    Quit date: 11/04/1967    Years since quitting: 54.1   Smokeless tobacco: Never  Substance Use Topics   Alcohol use: No   Drug use: No   Social History   Social History Narrative   Married father of 2. Retired from [employer].   Former smoker - quit in 1969 after 11 years; denies alcohol consumption    OBJCTIVE -PE, EKG, labs   Wt Readings from Last 3 Encounters:  11/11/21 205 lb (93 kg)  09/11/20 212 lb 3.2 oz (96.3 kg)  02/14/20 213 lb (96.6 kg)    Physical Exam: BP (!) 142/80 (BP Location: Left Arm, Patient Position: Sitting, Cuff Size: Normal)    Pulse 75    Resp 20    Ht 5\' 11"  (1.803 m)    Wt 205 lb (93 kg)    SpO2 96%    BMI 28.59 kg/m  Physical Exam Vitals reviewed.  Constitutional:      General: He is not in acute distress.    Appearance: Normal appearance. He is normal weight. He is not ill-appearing or toxic-appearing.     Comments: Well-nourished, well-groomed.  Has lost weight.  Still wears bifocals over his nose.  HENT:     Head: Normocephalic and atraumatic.  Neck:     Vascular: No carotid bruit or JVD.  Cardiovascular:     Rate and Rhythm: Normal rate  and regular rhythm. Occasional Extrasystoles are present.    Chest Wall: PMI is not displaced.     Pulses: Normal pulses and intact distal pulses.     Heart sounds: Heart sounds are distant. No murmur heard.   No friction rub. No gallop.     Comments: Normal S1 with split S2 Pulmonary:     Effort: Pulmonary effort is normal. No respiratory distress.     Breath sounds: Normal breath sounds. No wheezing, rhonchi or rales.  Chest:     Chest wall: No tenderness.  Abdominal:     General: Abdomen is flat.     Palpations: Abdomen is soft.  Musculoskeletal:        General: Swelling (Trivial) present. Normal range of motion.     Cervical back: Normal range of motion and neck supple.  Skin:    General: Skin is warm and dry.  Neurological:     General: No focal deficit present.     Mental Status: He is alert and oriented to person, place, and time.     Gait: Gait abnormal (Somewhat slow, deliberate).  Psychiatric:        Mood and Affect: Mood normal.     Comments: Somewhat tangential speech.  Poor historian.  Adult ECG Report  Rate: 75 ;  Rhythm: normal sinus rhythm, premature ventricular contractions (PVC), and RBBB with repolarization changes. ; Normal axis, intervals and durations.  Narrative Interpretation: Stable  Recent Labs:   06/17/2021: TC 162, TG 202, HDL 30, LDL 97. 09/30/2021: A1c 5.8, Hgb 14, Cr 4.22, K+ 4.7, TSH 2.11 Lab Results  Component Value Date   CHOL 118 03/03/2017   HDL 34 (L) 03/03/2017   LDLCALC 64 03/03/2017   TRIG 98 03/03/2017   CHOLHDL 3.5 03/03/2017   Lab Results  Component Value Date   CREATININE 1.39 (H) 03/03/2017   BUN 28 (H) 03/03/2017   NA 140 03/03/2017   K 4.1 03/03/2017   CL 105 03/03/2017   CO2 30 03/03/2017   CBC Latest Ref Rng & Units 03/03/2017 03/02/2017 01/04/2017  WBC 4.0 - 10.5 K/uL 5.6 6.2 6.5  Hemoglobin 13.0 - 17.0 g/dL 15.0 15.4 16.8  Hematocrit 39.0 - 52.0 % 43.7 43.7 48.1  Platelets 150 - 400 K/uL 102(L) 114(L) 127(L)     No results found for: HGBA1C No results found for: TSH  ==================================================  COVID-19 Education: The signs and symptoms of COVID-19 were discussed with the patient and how to seek care for testing (follow up with PCP or arrange E-visit).    I spent a total of 20 minutes with the patient spent in direct patient consultation.  Additional time spent with chart review  / charting (studies, outside notes, etc): 18 min Total Time: 38 min  Current medicines are reviewed at length with the patient today.  (+/- concerns) none  This visit occurred during the SARS-CoV-2 public health emergency.  Safety protocols were in place, including screening questions prior to the visit, additional usage of staff PPE, and extensive cleaning of exam room while observing appropriate contact time as indicated for disinfecting solutions.  Notice: This dictation was prepared with Dragon dictation along with smart phrase technology. Any transcriptional errors that result from this process are unintentional and may not be corrected upon review.  Studies Ordered:   Orders Placed This Encounter  Procedures   EKG 12-Lead    Patient Instructions / Medication Changes & Studies & Tests Ordered   Patient Instructions  Medication Instructions:   No changes *If you need a refill on your cardiac medications before your next appointment, please call your pharmacy*   Lab Work:  Not needed   Testing/Procedures:  Not needed  Follow-Up: At [facility], you and your health needs are our priority.  As part of our continuing mission to provide you with exceptional heart care, we have created designated Provider Care Teams.  These Care Teams include your primary Cardiologist (physician) and Advanced Practice Providers (APPs -  Physician Assistants and Nurse Practitioners) who all work together to provide you with the care you need, when you need it.     Your next appointment:    12 month(s)  The format for your next appointment:   In Person  Provider:   Glenetta Hew, MD     Signed, Glenetta Hew, M.D., M.S. Interventional Cardiologist   Pager # [phone] Phone # [phone] [address]. [place], [place] [address]   Thank you for choosing Heartcare at [facility]!!

## 2021-12-03 ENCOUNTER — Other Ambulatory Visit: Payer: Self-pay | Admitting: Cardiology

## 2021-12-16 ENCOUNTER — Encounter: Payer: Self-pay | Admitting: Cardiology

## 2021-12-16 NOTE — Assessment & Plan Note (Signed)
Minimal ectasia in the abdominal aorta as well as thoracic aorta. Can consider AAA Doppler versus CTA Aorta at annual follow-up.

## 2021-12-16 NOTE — Assessment & Plan Note (Signed)
Most recent lipids showed LDL 97 off of statin.  With his memory issues, I am reluctant to go back on statin.  He is reluctant to try new medicines as he is finally somewhat stabilized out.  We talked about the potential risks of not being aggressive with his lipids, and he understands.  As such, we will hold off on further medications.

## 2021-12-16 NOTE — Assessment & Plan Note (Signed)
Chronic issue.  No longer on beta-blocker, no longer on statin.  Continue to encourage him to stay active.

## 2021-12-16 NOTE — Assessment & Plan Note (Signed)
No further angina on moderate dose Imdur.  This also keeps his "foggy headedness " at [place].  No longer on either beta-blocker or ARB because of fatigue and hypotension issues.  No longer on statin because of memory issues.  On maintenance dose Plavix but aspirin stopped because of bruising.  Plan: Continue Imdur 60 mg daily  Continue maintenance dose Plavix 75 mg.  Okay to hold Plavix 5 to 7 days preop for surgeries or procedures.

## 2021-12-16 NOTE — Assessment & Plan Note (Signed)
I suspect this is mostly deconditioning. He is on low-dose furosemide for blood pressure and edema with no PND orthopnea.  I do not think is CHF related.

## 2021-12-16 NOTE — Assessment & Plan Note (Signed)
Not really bothering him anymore.  Not on STEMI beta-blocker.  Trying to avoid triggers

## 2021-12-16 NOTE — Assessment & Plan Note (Signed)
Blood pressure is borderline elevated today but he has not yet taken his medications today.  Not sure why.  He really is only on Imdur and furosemide at this point time. With dizziness, we are trying to avoid hypotension, allowing for permissive hypertension.

## 2021-12-24 DIAGNOSIS — R7303 Prediabetes: Secondary | ICD-10-CM | POA: Diagnosis not present

## 2021-12-24 DIAGNOSIS — E782 Mixed hyperlipidemia: Secondary | ICD-10-CM | POA: Diagnosis not present

## 2021-12-26 DIAGNOSIS — I10 Essential (primary) hypertension: Secondary | ICD-10-CM | POA: Diagnosis not present

## 2021-12-26 DIAGNOSIS — R7303 Prediabetes: Secondary | ICD-10-CM | POA: Diagnosis not present

## 2021-12-26 DIAGNOSIS — I77811 Abdominal aortic ectasia: Secondary | ICD-10-CM | POA: Diagnosis not present

## 2021-12-26 DIAGNOSIS — I701 Atherosclerosis of renal artery: Secondary | ICD-10-CM | POA: Diagnosis not present

## 2021-12-26 DIAGNOSIS — I25119 Atherosclerotic heart disease of native coronary artery with unspecified angina pectoris: Secondary | ICD-10-CM | POA: Diagnosis not present

## 2021-12-26 DIAGNOSIS — E782 Mixed hyperlipidemia: Secondary | ICD-10-CM | POA: Diagnosis not present

## 2022-01-27 DIAGNOSIS — Z9181 History of falling: Secondary | ICD-10-CM | POA: Diagnosis not present

## 2022-01-27 DIAGNOSIS — Z0000 Encounter for general adult medical examination without abnormal findings: Secondary | ICD-10-CM | POA: Diagnosis not present

## 2022-01-27 DIAGNOSIS — E785 Hyperlipidemia, unspecified: Secondary | ICD-10-CM | POA: Diagnosis not present

## 2022-03-19 ENCOUNTER — Other Ambulatory Visit: Payer: Self-pay | Admitting: Cardiology

## 2022-04-30 ENCOUNTER — Other Ambulatory Visit: Payer: Self-pay | Admitting: Cardiology

## 2022-06-28 ENCOUNTER — Other Ambulatory Visit: Payer: Self-pay | Admitting: Cardiology

## 2022-06-30 DIAGNOSIS — Z79899 Other long term (current) drug therapy: Secondary | ICD-10-CM | POA: Diagnosis not present

## 2022-06-30 DIAGNOSIS — R5382 Chronic fatigue, unspecified: Secondary | ICD-10-CM | POA: Diagnosis not present

## 2022-06-30 DIAGNOSIS — I10 Essential (primary) hypertension: Secondary | ICD-10-CM | POA: Diagnosis not present

## 2022-06-30 DIAGNOSIS — R413 Other amnesia: Secondary | ICD-10-CM | POA: Diagnosis not present

## 2022-06-30 DIAGNOSIS — K219 Gastro-esophageal reflux disease without esophagitis: Secondary | ICD-10-CM | POA: Diagnosis not present

## 2022-06-30 DIAGNOSIS — E782 Mixed hyperlipidemia: Secondary | ICD-10-CM | POA: Diagnosis not present

## 2022-06-30 DIAGNOSIS — R7303 Prediabetes: Secondary | ICD-10-CM | POA: Diagnosis not present

## 2022-06-30 DIAGNOSIS — I25119 Atherosclerotic heart disease of native coronary artery with unspecified angina pectoris: Secondary | ICD-10-CM | POA: Diagnosis not present

## 2022-06-30 DIAGNOSIS — Z139 Encounter for screening, unspecified: Secondary | ICD-10-CM | POA: Diagnosis not present

## 2022-06-30 DIAGNOSIS — I7121 Aneurysm of the ascending aorta, without rupture: Secondary | ICD-10-CM | POA: Diagnosis not present

## 2022-06-30 DIAGNOSIS — I701 Atherosclerosis of renal artery: Secondary | ICD-10-CM | POA: Diagnosis not present

## 2022-07-14 ENCOUNTER — Other Ambulatory Visit: Payer: Self-pay | Admitting: Cardiology

## 2022-09-10 ENCOUNTER — Other Ambulatory Visit: Payer: Self-pay | Admitting: Cardiology

## 2022-09-25 ENCOUNTER — Other Ambulatory Visit: Payer: Self-pay | Admitting: Cardiology

## 2022-11-11 ENCOUNTER — Ambulatory Visit: Payer: Medicare Other | Admitting: Cardiology

## 2022-11-13 ENCOUNTER — Ambulatory Visit: Payer: Medicare Other | Admitting: Physician Assistant

## 2022-11-17 ENCOUNTER — Telehealth: Payer: Self-pay

## 2022-11-17 NOTE — Patient Outreach (Signed)
  Care Coordination   Initial Visit Note   11/17/2022 Name: Johnny Kane MRN: [id number] DOB: 1941/05/27  Johnny Kane is a 82 y.o. year old male who sees Helen Hashimoto., MD for primary care. I spoke with  [name] by phone today.  What matters to the patients health and wellness today?  Placed call to patient today to review and offer [facility] care coordination program.  Patient reports that he is doing well. Reports that he is still able to help others.   Denies any needs at this time.    SDOH assessments and interventions completed:  No     Care Coordination Interventions:  No, not indicated   Follow up plan: No further intervention required.   Encounter Outcome:  Pt. Refused   Tomasa Rand, RN, BSN, CEN [facility] [employer] [phone]

## 2022-11-28 ENCOUNTER — Telehealth: Payer: Self-pay | Admitting: Cardiology

## 2022-11-28 NOTE — Telephone Encounter (Signed)
Thank you for the heads up Jenna. I can address any cardiac concerns at his upcoming visit but any mental competency evaluation needs to take place with his PCP or Neurology. I would recommend daughter reach out to one of them in regards to this because this is not a cardiac issue.  Thank you! Neithan Day

## 2022-11-28 NOTE — Telephone Encounter (Signed)
Returned call to patient's daughter Johnny Kane ([place] from 2020). She is concerned with his speech and driving. She reports this has been going on a while. She discussed this with PCP some years ago but was told it was not a problem. She reports his reflexes are slow, his speech and "thinking are really off". She reports his ability to recall things is impaired. Patient saw neurology in 2021 for memory issues. Daughter and son are concerned about patient. Daughter reports patient will not let family help him - daughter and son are concerned that he may need legal guardianship.   PCP - Maura [name]'s office has been provided this same info.  Daughter said that she was taken off his HIPAA/DPR at PCP office.  Routed to [facility] PA and Dr. Ellyn Hack -- can communication any information to PCP. Daughter requests assessment of this be done at upcoming visit.  Explained that not certain such issues will be addressed at a cardiology appointment but will send message   Communication about this matter should be directed to Johnny Kane (daughter) per request

## 2022-11-28 NOTE — Telephone Encounter (Signed)
Daughter aware of advice from [place]. She expressed concerns about how to have this addressed. Reiterated that such eval should be done by PCP or neuro (last seen 2021)

## 2022-11-28 NOTE — Telephone Encounter (Signed)
Daughter states that the patient has been stuttering to get his words out and his driving responses have been very slow.  Daughter stated she will be with the patient at his next visit and she would like his mental competency evaluated.

## 2022-11-29 NOTE — Telephone Encounter (Signed)
I have not seen him in a while.  But if he has progressed from when I saw him last, I really do agree that he probably ought not to be driving.  However, I do agree this is a PCP/PMD issue-but ultimately the family needs to make that decision.  Glenetta Hew, MD

## 2022-11-30 NOTE — Progress Notes (Signed)
Cardiology Office Note:    Date:  12/11/2022   ID:  Johnny Kane, DOB Mar 14, 1941, MRN [id number]  PCP:  Leonides Sake, MD  Cardiologist:  Glenetta Hew, MD  Electrophysiologist:  None   Referring MD: Helen Hashimoto., MD   Chief Complaint: routine follow-up of CAD  History of Present Illness:    Johnny Kane is a 82 y.o. male with a history of CAD s/p DES to RCA in 11/2013, palpitations with PVCs noted on prior monitor in 2017, ectatic abdominal aorta, renal artery stenosis, hypertension, hyperlipidemia, type 2 diabetes mellitus, GERD, mild obstructive sleep apnea, and memory loss/ dementia who is followed by Dr. Ellyn Hack and presents today for routine follow-up.   Patient has a history of CAD s/p DES to RCA in 11/2013.  He underwent a repeat cardiac catheterization in 10/2016 for further evaluation of chest pain following an abnormal Myoview which showed patent stent to RCA and otherwise only mild CAD.  Continued medical therapy was recommended at that time.  Echo in 03/2017 showed LVEF of 50-55% with normal wall motion, mild LVH, and grade 1 diastolic dysfunction.  Lower extremity arterial dopplers were ordered in 09/2019 for bilateral leg pain which showed normal ABIs. Patient was last seen by Dr. Ellyn Hack in 11/2021 at which time he reported occasional palpitations that he described as a "skipped beat" and dizziness if he stands too quickly He also reported some dyspnea on exertion which was felt to be due to deconditioning and feeling "worn out" at the end of the day. However, he denied any recurrent chest pain. His fatigue was a chronic issues and he was encouraged to stay active.  Patient presents today for routine follow-up. Patient has some underlying memory issues and is a difficult historian.  However, his daughter is with him today and help provide clarity.  It really sounds like he has been doing well from a cardiac standpoint and is stable.  He initially denied any chest pain  but then he stated that he took 1 dose of sublingual nitroglycerin on 4 different occasions for very mild chest discomfort.  He was unable to really elaborate on this pain but stated it only lasted a couple seconds and resolved immediately with the nitro.  Daughter states that she does not think that he had 4 episodes of this.  She thinks it was an isolated event.  Patient states this occurred about 1 year ago.  No other recurrent episodes of chest pain/ discomfort. I do not think this was cardiac in nature. It does not sound like angina. He also reports a "lump" on the left side of his chest that he states has been there for a couple of years. It is not painful.  I do not appreciate anything significant on exam.  He denies any shortness of breath, orthopnea, PND, lower extremity edema. No palpitations, lightheadedness, dizziness. No syncope. He continues to have chronic fatigue but this is stable.  He also reports some gradual weight loss over the last several years but denies any night sweats, body aches/chills, or abnormal bleeding in urine or stools. Daughter states he used to weight 260 lbs and he is 198 lbs today but this has weight loss has occurred over several years. thinks this due to decreased appetite.  Patient and daughter did mention concerns about progression of his memory issues.  Daughter states they first began to notice memory problems about 20 years ago but it has gotten progressively worse over the last  year.  He was recently seen by his PCP and completed a mental capacity evaluation. I am not sure what this showed.  Past Medical History:  Diagnosis Date   Aortic calcification (HCC)    No evidence of AAA on CT Abd in 2013   Aortic valve sclerosis    a. 12/2013 Echo: Aortic Sclerosis; EF 60-65%, Gr1 DD, mild AI, mildly dil LA.   CAD S/P percutaneous coronary angioplasty    a. 07/2011 Nl MV;  b. 11/2013 Cath: LM nl, LAD 20p, D1/2 nl, LCX 30p, OM1 40, OM2 nl, RCA 90-30m(3.5x18 Xience DES).  c. Myoview 04/2014: No ischmia/Infarction, normal EF. d. patent stent by cath in 10/2016.   Depression    Diabetes mellitus without complication ([place]    Diverticulosis of colon    GERD (gastroesophageal reflux disease)    Hiatal hernia    History of MRSA infection    Hyperlipidemia    Hypertension    Low testosterone    OA (osteoarthritis)    Palpitations    Palpitations    Sleep apnea    a. mild not on cpap    Vasovagal episode    Vitamin B 12 deficiency     Past Surgical History:  Procedure Laterality Date   CARDIAC CATHETERIZATION N/A 10/23/2016   Procedure: Left Heart Cath and Coronary Angiography;  Surgeon: DLeonie Man MD;  [facility];  Service: Cardiovascular:: Widely patent RCA stent and otherwise minimal CAD. EF 50-55%. Normal LVEDP   CHOLECYSTECTOMY N/A 07/20/2013   Procedure: LAPAROSCOPIC CHOLECYSTECTOMY;  Surgeon: TJoyice Faster Cornett, MD;  Location: [facility];  Service: General;  Laterality: N/A;   CORONARY ANGIOPLASTY WITH STENT PLACEMENT  11/25/2013   mRCA 90-95% - 0% (3.5x18 Xience DES).   LEFT HEART CATHETERIZATION WITH CORONARY ANGIOGRAM N/A 11/25/2013   Procedure: LEFT HEART CATHETERIZATION WITH CORONARY ANGIOGRAM;  Surgeon: DLeonie Man MD;  [facility];  Service: Cardiovascular: LM nl, LAD 20p, D1/2 nl, LCX 30p, OM1 40, OM2 nl, RCA 90-975m- DES PCI.    NM MYOVIEW LTD  10/2016   EF 44% with diffuse hypokinesis.  Otherwise no ischemia or infarction. -->  Follow-up catheterization showed widely patent RCA stent, otherwise normal coronary arteries with EF 55% and normal LVEDP.   REPLACEMENT TOTAL KNEE     one on each knee   TONSILLECTOMY     TRANSTHORACIC ECHOCARDIOGRAM  February 2015   EF 60-65%, Gr1DD, mild AI with aortic sclerosis, mildly dilated LA.   TRANSTHORACIC ECHOCARDIOGRAM  03/2017   mild LVH. EF 50-55%. GR1 DD, Elevated LVEDP. Trace AI. Mild Ao root dilation.    Current Medications: Current Meds  Medication Sig    busPIRone (BUSPAR) 10 MG tablet Take 1 tablet by mouth 3 (three) times daily.    clopidogrel (PLAVIX) 75 MG tablet TAKE 1 TABLET BY MOUTH DAILY   Cyanocobalamin (VITAMIN B-12) 5000 MCG TBDP Take 5,000 mcg by mouth daily after lunch.   escitalopram (LEXAPRO) 20 MG tablet Take 20 mg by mouth daily.   famotidine (PEPCID) 20 MG tablet Take 20 mg by mouth See admin instructions. Take 1 tablet (20 mg) by mouth twice daily - after lunch and at bedtime   furosemide (LASIX) 20 MG tablet TAKE 1 TABLET BY MOUTH  DAILY   Guaifenesin 1200 MG TB12 Take 1,200 mg by mouth daily as needed (congestion).   isosorbide mononitrate (IMDUR) 60 MG 24 hr tablet TAKE 1 TABLET BY MOUTH TWICE  DAILY  modafinil (PROVIGIL) 200 MG tablet Take 200 mg by mouth See admin instructions. Take 1 tablet (200 mg) by mouth twice daily - every morning and at 4:30pm   nitroGLYCERIN (NITROSTAT) 0.4 MG SL tablet Place 1 tablet (0.4 mg total) under the tongue every 5 (five) minutes as needed for chest pain.   polyethylene glycol (MIRALAX / GLYCOLAX) packet Take 17 g by mouth daily as needed (constipation). Mix in 8 oz liquid and drink     Allergies:   Ciprofloxacin, Donepezil, Esomeprazole magnesium, Penicillins, Soma [carisoprodol], and Sulfa drugs cross reactors   Social History   Socioeconomic History   Marital status: Legally Separated    Spouse name: Not on file   Number of children: 2   Years of education: Not on file   Highest education level: Not on file  Occupational History   Occupation: [employer]    Comment: retired  Tobacco Use   Smoking status: Former    Years: 11.00    Types: Cigarettes    Quit date: 11/04/1967    Years since quitting: 55.1   Smokeless tobacco: Never  Substance and Sexual Activity   Alcohol use: No   Drug use: No   Sexual activity: Not on file  Other Topics Concern   Not on file  Social History Narrative   Married father of 2. Retired from [employer].   Former smoker - quit in 1969 after 11 years; denies  alcohol consumption   Social Determinants of Radio broadcast assistant Strain: Not on file  Food Insecurity: Not on file  Transportation Needs: Not on file  Physical Activity: Not on file  Stress: Not on file  Social Connections: Not on file     Family History: The patient's family history includes Asthma in his mother; Heart attack in his father; Hypertension in his mother.  ROS:   Please see the history of present illness.     EKGs/Labs/Other Studies Reviewed:    The following studies were reviewed:  Left Cardiac Catheterization 10/23/2016: Prox RCA DES Stent: 0 %stenosed. Otherwise minimal CAD LV end diastolic pressure is normal. The left ventricular systolic function is normal. The left ventricular ejection fraction is 50-55% by visual estimate.   Angiographic no evidence of any significant CAD. Nothing to suggest a reduced ejection fraction. Left ventriculography revealed relatively normal EF. I suspect that the nuclear stress test reading was inaccurate.   Plan: Return to short stay for TR band removal. Anticipate discharge later today. He will follow-up post cath as scheduled. _______________  Echocardiogram 03/03/2017: Study Conclusions: - Left ventricle: The cavity size was normal. Wall thickness was    increased in a pattern of mild LVH. Systolic function was normal.    The estimated ejection fraction was in the range of 50% to 55%.    Wall motion was normal; there were no regional wall motion    abnormalities. Doppler parameters are consistent with abnormal    left ventricular relaxation (grade 1 diastolic dysfunction).    Doppler parameters are consistent with high ventricular filling    pressure.  - Aortic valve: There was trivial regurgitation.  - Aortic root: The aortic root was mildly dilated.  - Mitral valve: Calcified annulus.   Impressions: - Normal LV systolic function; mild LVH; grade 1 diastolic    dysfunction with elevated LV filling  pressure; calcified aortic    valve with trace AI; mildly dilated aortic root.  _______________  Lower Extremity Arterial Dopplers 09/27/2019: Summary:  Right: Resting right ankle-brachial  index is within normal range. No  evidence of significant right lower extremity arterial disease. The right  toe-brachial index is normal.   Left: Resting left ankle-brachial index is within normal range. No  evidence of significant left lower extremity arterial disease. The left  toe-brachial index is normal.   EKG:  EKG ordered today. EKG personally reviewed and demonstrates normal sinus rhythm, rate 75 bpm, with known RBBB and underlying artifact in lead V3. No significant changes compared to prior tracings.   Recent Labs: No results found for requested labs within last 365 days.  Recent Lipid Panel    Component Value Date/Time   CHOL 118 03/03/2017 0450   TRIG 98 03/03/2017 0450   HDL 34 (L) 03/03/2017 0450   CHOLHDL 3.5 03/03/2017 0450   VLDL 20 03/03/2017 0450   LDLCALC 64 03/03/2017 0450    Physical Exam:    Vital Signs: BP 128/70   Pulse 75   Ht 6' (1.829 m)   Wt 198 lb 3.2 oz (89.9 kg)   SpO2 98%   BMI 26.88 kg/m     Wt Readings from Last 3 Encounters:  12/11/22 198 lb 3.2 oz (89.9 kg)  11/11/21 205 lb (93 kg)  09/11/20 212 lb 3.2 oz (96.3 kg)     General: 82 y.o. Caucasian male in no acute distress. HEENT: Normocephalic and atraumatic. Sclera clear.  Neck: Supple. No carotid bruits. No JVD. Heart: RRR. Distinct S1 and S2. No murmurs, gallops, or rubs. Radial pulses 2+ and equal bilaterally. Lungs: No increased work of breathing. Clear to ausculation bilaterally. No wheezes, rhonchi, or rales.  Abdomen: Soft, non-distended, and non-tender to palpation. B Extremities: No lower extremity edema.    Skin: Warm and dry. Neuro: Alert and oriented x3. No focal deficits. Psych: Normal affect. Responds appropriately.   Assessment:    1. Coronary artery disease involving  native coronary artery of native heart without angina pectoris   2. History of Palpitations   3. PVC (premature ventricular contraction)   4. Dilatation of thoracic aorta ([place])   5. Ectatic abdominal aorta (HCC)   6. Primary hypertension   7. Hyperlipidemia, unspecified hyperlipidemia type     Plan:    CAD S/p DES to RCA in 2015. Last cath in 2017 showed patent stent with otherwise only mild disease. - He describe one very brief episode of vague chest pain about 1 year ago that resolved within seconds after one dose of Nitro.  - Continue Imdur '60mg'$  daily.  - Continue Plavix monotherapy. - Not on statin (see below). - Patient is a difficult historian due to underlying memory issues. Given this was an isolated episode and he has not had any recurrence in about a year, I don't think any additional ischemic work-up is necessary at this time. Patient and daughter were in agreement with this.   History of Palpitations PVCs History of palpitations with PVCs noted on prior monitor in 2017.  - Stable.  - Not requiring any AV nodal agents.   Ascending Thoracic Aorta Ectatic Abdominal Aorta Abdominal/pelvic CTA in 2017 was negative for any true AAA but did show mild bulge/ectasia of the abdominal aorta at the level of the IMA origin measuring up to 2.5cm in diameter compared to infrarenal diameter of 2cm. Follow-up ultrasound was recommended in 5 years due to risk for aneurysm development. That report also mentions that a prior study in 2013 showed mild aneurysmal dilation of the ascending thoracic aorta measuring 4.2 cm. - Offered repeat CTA  to assess both abdominal aorta and the ascending thoracic aorta but patient and daughter would like to hold off at this time.  Hypertension BP well controlled.  - Continue Imdur '60mg'$  daily. Also on Lasix '20mg'$  daily for lower extremity edema.  - Of note, patient was previously on Olmesartan '40mg'$  daily but daughter states he is no longer taking this. Will  remove from medication list.   Hyperlipidemia Last lipid panel in 12/2021: Total Cholesterol 166, Triglycerides 135, HDL 36, LDL 106. - He was previously on a statin but this was stopped due to memory issues. Per Dr. Allison Quarry last, he was reluctant to go back on a statin with patient's memory issues and patient was reluctant to try any new medications as well.   Disposition: Follow up in 1 year.    Medication Adjustments/Labs and Tests Ordered: Current medicines are reviewed at length with the patient today.  Concerns regarding medicines are outlined above.  Orders Placed This Encounter  Procedures   EKG 12-Lead   No orders of the defined types were placed in this encounter.   Patient Instructions  Medication Instructions:  Your physician recommends that you continue on your current medications as directed. Please refer to the Current Medication list given to you today.  *If you need a refill on your cardiac medications before your next appointment, please call your pharmacy*   Lab Work: NONE If you have labs (blood work) drawn today and your tests are completely normal, you will receive your results only by: [place] (if you have MyChart) OR A paper copy in the mail If you have any lab test that is abnormal or we need to change your treatment, we will call you to review the results.   Testing/Procedures: NONE   Follow-Up: At [facility], you and your health needs are our priority.  As part of our continuing mission to provide you with exceptional heart care, we have created designated Provider Care Teams.  These Care Teams include your primary Cardiologist (physician) and Advanced Practice Providers (APPs -  Physician Assistants and Nurse Practitioners) who all work together to provide you with the care you need, when you need it.  We recommend signing up for the patient portal called "MyChart".  Sign up information is provided on this After Visit Summary.   MyChart is used to connect with patients for Virtual Visits (Telemedicine).  Patients are able to view lab/test results, encounter notes, upcoming appointments, etc.  Non-urgent messages can be sent to your provider as well.   To learn more about what you can do with MyChart, go to [email or web address].    Your next appointment:   1 year(s)  Provider:   Glenetta Hew, MD OR Sande Rives, PA     Signed, Darreld Mclean, PA-C  12/11/2022 4:35 PM    [place]

## 2022-12-01 DIAGNOSIS — F01B0 Vascular dementia, moderate, without behavioral disturbance, psychotic disturbance, mood disturbance, and anxiety: Secondary | ICD-10-CM | POA: Diagnosis not present

## 2022-12-01 DIAGNOSIS — G319 Degenerative disease of nervous system, unspecified: Secondary | ICD-10-CM | POA: Diagnosis not present

## 2022-12-01 DIAGNOSIS — I679 Cerebrovascular disease, unspecified: Secondary | ICD-10-CM | POA: Diagnosis not present

## 2022-12-02 DIAGNOSIS — H40033 Anatomical narrow angle, bilateral: Secondary | ICD-10-CM | POA: Diagnosis not present

## 2022-12-02 DIAGNOSIS — E119 Type 2 diabetes mellitus without complications: Secondary | ICD-10-CM | POA: Diagnosis not present

## 2022-12-03 ENCOUNTER — Other Ambulatory Visit: Payer: Self-pay | Admitting: Cardiology

## 2022-12-11 ENCOUNTER — Ambulatory Visit: Payer: Medicare Other | Attending: Physician Assistant | Admitting: Student

## 2022-12-11 ENCOUNTER — Encounter: Payer: Self-pay | Admitting: Student

## 2022-12-11 VITALS — BP 128/70 | HR 75 | Ht 72.0 in | Wt 198.2 lb

## 2022-12-11 DIAGNOSIS — R002 Palpitations: Secondary | ICD-10-CM

## 2022-12-11 DIAGNOSIS — I2510 Atherosclerotic heart disease of native coronary artery without angina pectoris: Secondary | ICD-10-CM | POA: Diagnosis not present

## 2022-12-11 DIAGNOSIS — I77811 Abdominal aortic ectasia: Secondary | ICD-10-CM | POA: Diagnosis not present

## 2022-12-11 DIAGNOSIS — I77810 Thoracic aortic ectasia: Secondary | ICD-10-CM | POA: Diagnosis not present

## 2022-12-11 DIAGNOSIS — E785 Hyperlipidemia, unspecified: Secondary | ICD-10-CM

## 2022-12-11 DIAGNOSIS — I10 Essential (primary) hypertension: Secondary | ICD-10-CM | POA: Diagnosis not present

## 2022-12-11 DIAGNOSIS — I493 Ventricular premature depolarization: Secondary | ICD-10-CM

## 2022-12-11 NOTE — Patient Instructions (Signed)
Medication Instructions:  Your physician recommends that you continue on your current medications as directed. Please refer to the Current Medication list given to you today.  *If you need a refill on your cardiac medications before your next appointment, please call your pharmacy*   Lab Work: NONE If you have labs (blood work) drawn today and your tests are completely normal, you will receive your results only by: [place] (if you have MyChart) OR A paper copy in the mail If you have any lab test that is abnormal or we need to change your treatment, we will call you to review the results.   Testing/Procedures: NONE   Follow-Up: At [facility], you and your health needs are our priority.  As part of our continuing mission to provide you with exceptional heart care, we have created designated Provider Care Teams.  These Care Teams include your primary Cardiologist (physician) and Advanced Practice Providers (APPs -  Physician Assistants and Nurse Practitioners) who all work together to provide you with the care you need, when you need it.  We recommend signing up for the patient portal called "MyChart".  Sign up information is provided on this After Visit Summary.  MyChart is used to connect with patients for Virtual Visits (Telemedicine).  Patients are able to view lab/test results, encounter notes, upcoming appointments, etc.  Non-urgent messages can be sent to your provider as well.   To learn more about what you can do with MyChart, go to [email or web address].    Your next appointment:   1 year(s)  Provider:   Glenetta Hew, MD [place], [place]

## 2022-12-24 DIAGNOSIS — F01B0 Vascular dementia, moderate, without behavioral disturbance, psychotic disturbance, mood disturbance, and anxiety: Secondary | ICD-10-CM | POA: Diagnosis not present

## 2022-12-24 DIAGNOSIS — M549 Dorsalgia, unspecified: Secondary | ICD-10-CM | POA: Diagnosis not present

## 2022-12-24 DIAGNOSIS — S51012A Laceration without foreign body of left elbow, initial encounter: Secondary | ICD-10-CM | POA: Diagnosis not present

## 2023-01-06 ENCOUNTER — Other Ambulatory Visit: Payer: Self-pay | Admitting: Cardiology

## 2023-01-06 NOTE — Telephone Encounter (Signed)
This is usually a medicine pill by the PCP, but if that has been an issue for him, we can go ahead and refill it.  Glenetta Hew, MD

## 2023-01-06 NOTE — Telephone Encounter (Signed)
*  STAT* If patient is at the pharmacy, call can be transferred to refill team.   1. Which medications need to be refilled? (please list name of each medication and dose if known) escitalopram (LEXAPRO) 20 MG tablet   2. Which pharmacy/location (including street and city if local pharmacy) is medication to be sent to? [place], [place]   3. Do they need a 30 day or 90 day supply? [place]

## 2023-01-08 NOTE — Telephone Encounter (Signed)
Rn called patient 's daughter ,Toribio Harbour. She is not on DPR , but the alternate contact person.  She ID patient by to 2 identifiers.   Rn informed  Anderson Malta, that  the medication Lexapro  20 mg is  a medication that is not usually prescribe  by Dr Ellyn Hack. Primary Dr Lisbeth Ply would have prescribed.   If needed Dr Ellyn Hack states he will refill this once but Dr Dr Lisbeth Ply would need to continue.  Anderson Malta states patient informed her that Dr Ellyn Hack prescribe, that's why she called. She thanked Therapist, sports and she states not to refill medication and she will contact Dr [employer] office for the refill.

## 2023-02-05 ENCOUNTER — Other Ambulatory Visit: Payer: Self-pay | Admitting: Nurse Practitioner

## 2023-03-24 DIAGNOSIS — F01B0 Vascular dementia, moderate, without behavioral disturbance, psychotic disturbance, mood disturbance, and anxiety: Secondary | ICD-10-CM | POA: Diagnosis not present

## 2023-03-24 DIAGNOSIS — Z9181 History of falling: Secondary | ICD-10-CM | POA: Diagnosis not present

## 2023-03-29 ENCOUNTER — Other Ambulatory Visit: Payer: Self-pay | Admitting: Cardiology

## 2023-04-21 ENCOUNTER — Other Ambulatory Visit: Payer: Self-pay | Admitting: Cardiology

## 2023-05-21 ENCOUNTER — Other Ambulatory Visit: Payer: Self-pay | Admitting: Cardiology

## 2023-07-15 DIAGNOSIS — I25119 Atherosclerotic heart disease of native coronary artery with unspecified angina pectoris: Secondary | ICD-10-CM | POA: Diagnosis not present

## 2023-07-15 DIAGNOSIS — R7303 Prediabetes: Secondary | ICD-10-CM | POA: Diagnosis not present

## 2023-07-15 DIAGNOSIS — E782 Mixed hyperlipidemia: Secondary | ICD-10-CM | POA: Diagnosis not present

## 2023-07-15 DIAGNOSIS — K219 Gastro-esophageal reflux disease without esophagitis: Secondary | ICD-10-CM | POA: Diagnosis not present

## 2023-07-15 DIAGNOSIS — Z139 Encounter for screening, unspecified: Secondary | ICD-10-CM | POA: Diagnosis not present

## 2023-07-15 DIAGNOSIS — I77811 Abdominal aortic ectasia: Secondary | ICD-10-CM | POA: Diagnosis not present

## 2023-07-15 DIAGNOSIS — I701 Atherosclerosis of renal artery: Secondary | ICD-10-CM | POA: Diagnosis not present

## 2023-07-15 DIAGNOSIS — I7121 Aneurysm of the ascending aorta, without rupture: Secondary | ICD-10-CM | POA: Diagnosis not present

## 2023-07-15 DIAGNOSIS — R5382 Chronic fatigue, unspecified: Secondary | ICD-10-CM | POA: Diagnosis not present

## 2023-07-15 DIAGNOSIS — I10 Essential (primary) hypertension: Secondary | ICD-10-CM | POA: Diagnosis not present

## 2023-07-15 DIAGNOSIS — Z79899 Other long term (current) drug therapy: Secondary | ICD-10-CM | POA: Diagnosis not present

## 2023-08-24 ENCOUNTER — Other Ambulatory Visit: Payer: Self-pay | Admitting: Cardiology

## 2023-10-04 ENCOUNTER — Other Ambulatory Visit: Payer: Self-pay | Admitting: Cardiology

## 2023-11-02 ENCOUNTER — Other Ambulatory Visit: Payer: Self-pay | Admitting: Cardiology

## 2023-12-08 ENCOUNTER — Ambulatory Visit: Payer: Medicare Other | Attending: Cardiology | Admitting: Cardiology

## 2023-12-08 ENCOUNTER — Encounter: Payer: Self-pay | Admitting: Cardiology

## 2023-12-08 VITALS — BP 100/60 | HR 77 | Ht 72.0 in | Wt 201.4 lb

## 2023-12-08 DIAGNOSIS — Z9861 Coronary angioplasty status: Secondary | ICD-10-CM

## 2023-12-08 DIAGNOSIS — I10 Essential (primary) hypertension: Secondary | ICD-10-CM

## 2023-12-08 DIAGNOSIS — I714 Abdominal aortic aneurysm, without rupture, unspecified: Secondary | ICD-10-CM

## 2023-12-08 DIAGNOSIS — E785 Hyperlipidemia, unspecified: Secondary | ICD-10-CM

## 2023-12-08 DIAGNOSIS — R5383 Other fatigue: Secondary | ICD-10-CM

## 2023-12-08 DIAGNOSIS — I2510 Atherosclerotic heart disease of native coronary artery without angina pectoris: Secondary | ICD-10-CM

## 2023-12-08 DIAGNOSIS — F32A Depression, unspecified: Secondary | ICD-10-CM

## 2023-12-08 DIAGNOSIS — I493 Ventricular premature depolarization: Secondary | ICD-10-CM | POA: Diagnosis not present

## 2023-12-08 DIAGNOSIS — R55 Syncope and collapse: Secondary | ICD-10-CM

## 2023-12-08 DIAGNOSIS — R413 Other amnesia: Secondary | ICD-10-CM | POA: Diagnosis not present

## 2023-12-08 NOTE — Progress Notes (Signed)
 Cardiology Office Note:  .   Date:  12/10/2023  ID:  Johnny Kane, DOB [birth year], MRN [id number] PCP: [name], MD  [facility] Providers Cardiologist:  [name], MD     Chief Complaint  Patient presents with   Follow-up    Annual follow-up   Coronary Artery Disease    Patient Profile: .     Johnny Kane is a formerly obese 83 y.o. male with a PMH notable for CAD-DES PCI to RCA (2015), PVCs, Ectatic Abdominal Aorta, Renal Artery Stenosis with HTN, HLD and DM-2, GERD, mild OSA as well as memory loss/dementia who presents here for annual follow-up at the request of Johnny Kane, Charlene LITTIE, MD.  CAD: DES PCI RCA (2015) Relook Cath December 2017 abnormal Myoview  with patent RCA stent and otherwise minimal CAD. Echo May 2018: EF 50 to 55% with no RWMA.  Mild LVH with GR 1 DD. Frequent PVCs Abdominal aortic ectasia HTN/HLD DM-2 Dementia with memory loss.    Johnny Kane was last seen on December 11, 2022 by Aline Door, PA -> accompanied by daughter who helped with some of the historical issues.  Stable from a cardiac standpoint.  No chest pain or significant dyspnea.  1 dose of nitroglycerin  on 4 different occasions for mild chest discomfort.  He was not able to elaborate on the pain.  Daughter was less concerned.  Felt to be may be an isolated event.  He noted a lump on the left side of his chest.  No other symptoms.  His weight was down from 260 to 198 pounds.  Subjective  Discussed the use of AI scribe software for clinical note transcription with the patient, who gave verbal consent to proceed. He is present here today with his daughter who helps provide a good portion of the history.  History of Present Illness   Johnny Kane is an 83 year old male with coronary artery disease who presents for a cardiology follow-up. He is accompanied by his daughter, who assists with his care.  He has a history of coronary artery disease with previous stent placement  and experiences chronic chest discomfort, described as 'something right in here - noting Right Central Chest,' with no recent changes. He has used nitroglycerin  in the past but had a negative experience and has not used it again. No chest tightness, pressure, or pain during daily activities. No heart racing, skipping, or flipping. No recent episodes of rapid heart rates or irregular heartbeats. No syncope/near syncope of TIA/amaurosis fugax symptoms.    He is uncertain about his current medication regimen, which includes long-acting nitroglycerin  (isosorbide ), clopidogrel  (Plavix ), and furosemide . His daughter assists with managing his medications, but there is some confusion about the exact schedule and dosages. No swelling in his legs. He sometimes sleeps in a chair and uses multiple pillows due to mucus buildup when lying flat. He denies waking up at night due to shortness of breath.`  He describes himself as very active, spending time outdoors on his [place] property, and engaging in activities such as collecting farm equipment. He helped a neighbor with a car battery issue, which he found physically demanding. (However. his daughter is quick to point out that they probably got in further than I can handle.  Had difficulty getting the car back together again.  He thinks he is helping out, but he is actually probably making things worse.  He says he is always on the go, but she says he is  considerably more sedentary than he had been.)  He does not report chest discomfort during these activities, stating, 'I don't push myself that hard.'       Objective   Medications - Nitroglycerin  0.4 mg sublingual as needed (has not used in several months) - Isosorbide  mononitrate (Imdur ) 60 mg twice daily - Clopidogrel  75 mg daily - Furosemide  20 mg daily (has not required additional doses PRN.) - Modafinil  (Provigil ) 200 mg twice daily; -BuSpar 10 mg 3 times daily; Lexapro 20 mg daily -PRN guaifenesin and  MiraLAX   Studies Reviewed: SABRA   EKG Interpretation Date/Time:  Tuesday December 08 2023 14:21:46 EST Ventricular Rate:  77 PR Interval:  198 QRS Duration:  128 QT Interval:  414 QTC Calculation: 468 R Axis:   23  Text Interpretation:  Normal sinus rhythm Right bundle branch block When compared with selected ECG of 12/11/2022 No significant change since last tracing Confirmed by Anner Lenis ([id number]) on 12/10/2023 3:42:52 PM    ECHO (03/03/2017): EF 55%.  Mild LVH.  GR 1 DD with elevated LVEDP.  Calcific AoV with trace AI open sclerosis but no stenosis.  CT angio abdomen pelvis: No evidence of AAA.  Mild bulging.  Recommended 5-year follow-up testing ascending thoracic aorta measured 4.2 CATH (10/2016): Proximal RCA stent with 0% stenosis.  Otherwise minimal disease with 50% inferior branch of OM1.  Normal LVEDP with EF 50 to 55%.    Risk Assessment/Calculations:        Physical Exam:   VS:  BP 100/60 (BP Location: Left Arm, Patient Position: Sitting, Cuff Size: Normal)   Pulse 77   Ht 6' (1.829 m)   Wt 201 lb 6.4 oz (91.4 kg)   SpO2 97%   BMI 27.31 kg/m    Wt Readings from Last 3 Encounters:  12/08/23 201 lb 6.4 oz (91.4 kg)  12/11/22 198 lb 3.2 oz (89.9 kg)  11/11/21 205 lb (93 kg)    GEN: Well nourished, well developed in no acute distress; notable weight loss from before, now no longer obese NECK: No JVD; No carotid bruits CARDIAC: RRR with minimal ectopy.,  Distant heart sounds, but normal S1, split S2; no murmurs, rubs, gallops RESPIRATORY:  Clear to auscultation without rales, wheezing or rhonchi ; nonlabored, good air movement. ABDOMEN: Soft, non-tender, non-distended EXTREMITIES: Trivial ankle edema; No deformity; slow/deliberate gait.      ASSESSMENT AND PLAN: .    Problem List Items Addressed This Visit       Cardiology Problems   AAA (abdominal aortic aneurysm) (HCC) - Primary (Chronic)   Noted to be stable on Dopplers.  CTA showed mild thoracic aortic  dilation, but relatively stable findings in the abdominal aorta back in 2017. Would be due for a CTA which we could probably do chest abdomen pelvis at follow-up visit ; he and his daughter are not overly keen about doing too much testing/studies.      Relevant Orders   EKG 12-Lead (Completed)   CAD S/P percutaneous coronary angioplasty - RCA DES 11/25/13 (Chronic)   History of RCA PCI-noted to be patent in 2017 on relook cath. Stable angina, no new or worsening symptoms on current dose of Imdur  60 mg twice daily. Has not really been on tolerate other antihypertensives as they would potentially affect blood pressure.  Had fatigue with beta-blockers and hypotension issues with ARB. Statin discontinued because of memory issues.  Currently on maintenance dose Plavix  70 mg daily for SAPT  -Continue Plavix  and Isosorbide  as  prescribed. -If bruising or bleeding occurs, temporarily stop Plavix  and notify the office. -Okay to hold Plavix  5 to 7 days preop for surgeries or procedures.   For high risk procedures would do 7 days preop and 2 days postop.      Relevant Orders   EKG 12-Lead (Completed)   Essential hypertension (Chronic)   If anything a little hypotensive now.  No longer on any antihypertensive agents other than potentially Imdur .  Not having any syncope or near syncope, but with baseline blood pressures below, would not add any medications.      Relevant Orders   EKG 12-Lead (Completed)   Hyperlipidemia with target LDL less than 70 (Chronic)   Labs from September 2024 showed total cholesterol 164 and LDL 99, TG 126 and HDL 42.  Unfortunately, he was somewhat intolerant of statins due to memory issues and confusion.  He is no longer on any antiplatelet agents.  We talked about medical management, and is not really sure what he is taking what he is not taking.  Please see medical management section.  I am not sure if he would be agreeable to take an extra medications. I think we  would either stick with oral medication such as Nexlizet.      Symptomatic PVCs (Chronic)   Overall, pretty well-controlled.  No longer on beta-blocker. The key is as to try to avoid triggers.      Relevant Orders   EKG 12-Lead (Completed)     Other   Fatigue (Chronic)   Likely related to deconditioning.      Memory loss or impairment (Chronic)   Clearly has some stage dementia.  No longer on statin to help avoid medication           Assessment and Plan    Coronary Artery Disease   Fluid Retention No current symptoms of heart failure or significant leg swelling. -Take Furosemide  (Lasix ) on Monday, Wednesday, and Friday, and as needed if swelling occurs.  Medication Management Unclear medication adherence and understanding. -Provide a list of medications with instructions for use. -Recommend assistance with medication management, such as using a pill box. -Advise patient to review medication list with primary care provider.   Follow-Up: Return in about 1 year (around 12/07/2024).or sooner if any new or worsening symptoms occur.     I spent 41 minutes in the care of BEAUMONT AUSTAD today including reviewing outside labs from PCPs office-via [facility] (2 minutes), reviewing studies (3 minutes through Epic), face to face time discussing treatment options (25 minutes), reviewing records from previous clinic visits (3 minutes), 8 minutes dictating, and documenting in the encounter.     Signed, Alm MICAEL Clay, MD, MS [name], M.D., M.S. Interventional Cardiologist  [facility]  Pager # [phone] Phone # [phone] [address] [place], [place] [address]

## 2023-12-08 NOTE — Patient Instructions (Addendum)
 Medication Instructions:   Please bring all your medication  to every doctors's office   Please check the current  medication list that  you have at home.   Stay on Isosorbide  mononitrate ( Imdur )  and Clopidogrel   (Plavix  )  daily     Fluid pill  - furosemide  ( Lasix )   take it Monday - Wednesday - Friday  and if increase swelling occurs  then take an additional pill     *If you need a refill on your cardiac medications before your next appointment, please call your pharmacy*   Lab Work: Not needed     Testing/Procedures: Not needed   Follow-Up: At [facility], you and your health needs are our priority.  As part of our continuing mission to provide you with exceptional heart care, we have created designated Provider Care Teams.  These Care Teams include your primary Cardiologist (physician) and Advanced Practice Providers (APPs -  Physician Assistants and Nurse Practitioners) who all work together to provide you with the care you need, when you need it.     Your next appointment:   12 month(s)  The format for your next appointment:   In Person  Provider:    Aline Door, PA and [name], MD  in 24 months

## 2023-12-10 ENCOUNTER — Encounter: Payer: Self-pay | Admitting: Cardiology

## 2023-12-10 NOTE — Assessment & Plan Note (Signed)
 History of RCA PCI-noted to be patent in 2017 on relook cath. Stable angina, no new or worsening symptoms on current dose of Imdur  60 mg twice daily. Has not really been on tolerate other antihypertensives as they would potentially affect blood pressure.  Had fatigue with beta-blockers and hypotension issues with ARB. Statin discontinued because of memory issues.  Currently on maintenance dose Plavix  70 mg daily for SAPT  -Continue Plavix  and Isosorbide  as prescribed. -If bruising or bleeding occurs, temporarily stop Plavix  and notify the office. -Okay to hold Plavix  5 to 7 days preop for surgeries or procedures.   For high risk procedures would do 7 days preop and 2 days postop.

## 2023-12-10 NOTE — Assessment & Plan Note (Addendum)
 Noted to be stable on Dopplers.  CTA showed mild thoracic aortic dilation, but relatively stable findings in the abdominal aorta back in 2017. Would be due for a CTA which we could probably do chest abdomen pelvis at follow-up visit ; he and his daughter are not overly keen about doing too much testing/studies.

## 2023-12-10 NOTE — Assessment & Plan Note (Signed)
 Labs from September 2024 showed total cholesterol 164 and LDL 99, TG 126 and HDL 42.  Unfortunately, he was somewhat intolerant of statins due to memory issues and confusion.  He is no longer on any antiplatelet agents.  We talked about medical management, and is not really sure what he is taking what he is not taking.  Please see medical management section.  I am not sure if he would be agreeable to take an extra medications. I think we would either stick with oral medication such as Nexlizet.

## 2023-12-10 NOTE — Assessment & Plan Note (Signed)
 Overall, pretty well-controlled.  No longer on beta-blocker. The key is as to try to avoid triggers.

## 2023-12-10 NOTE — Assessment & Plan Note (Signed)
 Clearly has some stage dementia.  No longer on statin to help avoid medication

## 2023-12-10 NOTE — Assessment & Plan Note (Signed)
Likely related to deconditioning.

## 2023-12-10 NOTE — Assessment & Plan Note (Signed)
 If anything a little hypotensive now.  No longer on any antihypertensive agents other than potentially Imdur .  Not having any syncope or near syncope, but with baseline blood pressures below, would not add any medications.

## 2023-12-15 ENCOUNTER — Other Ambulatory Visit: Payer: Self-pay | Admitting: Student

## 2023-12-24 ENCOUNTER — Telehealth: Payer: Self-pay | Admitting: Cardiology

## 2023-12-24 MED ORDER — FUROSEMIDE 20 MG PO TABS: 20.0000 mg | ORAL_TABLET | Freq: Every day | ORAL | 3 refills | Status: AC

## 2023-12-24 NOTE — Telephone Encounter (Signed)
*  STAT* If patient is at the pharmacy, call can be transferred to refill team.   1. Which medications need to be refilled? (please list name of each medication and dose if known) furosemide (LASIX) 20 MG tablet    2. Which pharmacy/location (including street and city if local pharmacy) is medication to be sent to? [facility] - [place], [place] - [address]    3. Do they need a 30 day or 90 day supply? 90

## 2023-12-24 NOTE — Telephone Encounter (Signed)
Spoke with pt daughter, she reports the patient is taking olmesartan and it is not on his medication list and she is not sure if he should be taking it or not. They do not check his blood pressure at home because it is traumatic by her report. Aware will forward to dr harding to find out if he should continue olmesartan, she is unsure of the dose.

## 2023-12-24 NOTE — Telephone Encounter (Signed)
Pt's daughter is requesting a cb to discuss if pt should be taking Olmesartan and a statin?????

## 2023-12-24 NOTE — Telephone Encounter (Signed)
 Pt's medication was sent to pt's pharmacy as requested. Confirmation received.

## 2023-12-27 NOTE — Telephone Encounter (Signed)
 I actually do not think he needs to be on either the statin or the ARB.    Bryan Lemma, MD

## 2023-12-28 NOTE — Telephone Encounter (Signed)
 Called spoke  Victorino Dike   Informed Victorino Dike of  Dr Herbie Baltimore response- patient does not need Olmesartan  or Statin ( she states he is not taking any statin at present)    Victorino Dike states she will place the medication in do not take  pile  She verbalized understanding.

## 2024-01-26 DIAGNOSIS — E782 Mixed hyperlipidemia: Secondary | ICD-10-CM | POA: Diagnosis not present

## 2024-01-26 DIAGNOSIS — Z79899 Other long term (current) drug therapy: Secondary | ICD-10-CM | POA: Diagnosis not present

## 2024-01-26 DIAGNOSIS — I7121 Aneurysm of the ascending aorta, without rupture: Secondary | ICD-10-CM | POA: Diagnosis not present

## 2024-01-26 DIAGNOSIS — I77811 Abdominal aortic ectasia: Secondary | ICD-10-CM | POA: Diagnosis not present

## 2024-01-26 DIAGNOSIS — I25119 Atherosclerotic heart disease of native coronary artery with unspecified angina pectoris: Secondary | ICD-10-CM | POA: Diagnosis not present

## 2024-01-26 DIAGNOSIS — I10 Essential (primary) hypertension: Secondary | ICD-10-CM | POA: Diagnosis not present

## 2024-01-26 DIAGNOSIS — I701 Atherosclerosis of renal artery: Secondary | ICD-10-CM | POA: Diagnosis not present

## 2024-01-26 DIAGNOSIS — R5382 Chronic fatigue, unspecified: Secondary | ICD-10-CM | POA: Diagnosis not present

## 2024-01-26 DIAGNOSIS — K219 Gastro-esophageal reflux disease without esophagitis: Secondary | ICD-10-CM | POA: Diagnosis not present

## 2024-01-26 DIAGNOSIS — R7303 Prediabetes: Secondary | ICD-10-CM | POA: Diagnosis not present

## 2024-02-11 ENCOUNTER — Telehealth: Payer: Self-pay | Admitting: Cardiology

## 2024-02-11 MED ORDER — ISOSORBIDE MONONITRATE ER 60 MG PO TB24: 60.0000 mg | ORAL_TABLET | Freq: Two times a day (BID) | ORAL | 3 refills | Status: DC

## 2024-02-11 NOTE — Telephone Encounter (Signed)
*  STAT* If patient is at the pharmacy, call can be transferred to refill team.   1. Which medications need to be refilled? (please list name of each medication and dose if known) isosorbide mononitrate (IMDUR) 60 MG 24 hr tablet   2. Which pharmacy/location (including street and city if local pharmacy) is medication to be sent to? [facility] - [place], [place] - [address]   3. Do they need a 30 day or 90 day supply? 90

## 2024-02-11 NOTE — Telephone Encounter (Signed)
 Pt's medication was sent to pt's pharmacy as requested. Confirmation received.

## 2024-05-19 DIAGNOSIS — Z9181 History of falling: Secondary | ICD-10-CM | POA: Diagnosis not present

## 2024-05-19 DIAGNOSIS — R634 Abnormal weight loss: Secondary | ICD-10-CM | POA: Diagnosis not present

## 2024-05-19 DIAGNOSIS — R5382 Chronic fatigue, unspecified: Secondary | ICD-10-CM | POA: Diagnosis not present

## 2024-05-19 DIAGNOSIS — Z23 Encounter for immunization: Secondary | ICD-10-CM | POA: Diagnosis not present

## 2024-05-19 DIAGNOSIS — Z022 Encounter for examination for admission to residential institution: Secondary | ICD-10-CM | POA: Diagnosis not present

## 2024-05-27 ENCOUNTER — Emergency Department (HOSPITAL_COMMUNITY)
Admission: EM | Admit: 2024-05-27 | Discharge: 2024-05-27 | Disposition: A | Discharge status: Home / Self Care | Source: Skilled Nursing Facility | Attending: Emergency Medicine | Admitting: Emergency Medicine

## 2024-05-27 ENCOUNTER — Emergency Department (HOSPITAL_COMMUNITY)

## 2024-05-27 DIAGNOSIS — F0390 Unspecified dementia without behavioral disturbance: Secondary | ICD-10-CM | POA: Insufficient documentation

## 2024-05-27 DIAGNOSIS — K449 Diaphragmatic hernia without obstruction or gangrene: Secondary | ICD-10-CM | POA: Diagnosis not present

## 2024-05-27 DIAGNOSIS — E119 Type 2 diabetes mellitus without complications: Secondary | ICD-10-CM | POA: Diagnosis not present

## 2024-05-27 DIAGNOSIS — Z7401 Bed confinement status: Secondary | ICD-10-CM | POA: Diagnosis not present

## 2024-05-27 DIAGNOSIS — I2510 Atherosclerotic heart disease of native coronary artery without angina pectoris: Secondary | ICD-10-CM | POA: Diagnosis not present

## 2024-05-27 DIAGNOSIS — R0989 Other specified symptoms and signs involving the circulatory and respiratory systems: Secondary | ICD-10-CM | POA: Diagnosis not present

## 2024-05-27 DIAGNOSIS — R4182 Altered mental status, unspecified: Secondary | ICD-10-CM | POA: Diagnosis present

## 2024-05-27 DIAGNOSIS — R451 Restlessness and agitation: Secondary | ICD-10-CM | POA: Diagnosis not present

## 2024-05-27 DIAGNOSIS — I10 Essential (primary) hypertension: Secondary | ICD-10-CM | POA: Insufficient documentation

## 2024-05-27 DIAGNOSIS — R3915 Urgency of urination: Secondary | ICD-10-CM | POA: Insufficient documentation

## 2024-05-27 DIAGNOSIS — Z743 Need for continuous supervision: Secondary | ICD-10-CM | POA: Diagnosis not present

## 2024-05-27 DIAGNOSIS — R410 Disorientation, unspecified: Secondary | ICD-10-CM

## 2024-05-27 DIAGNOSIS — R0689 Other abnormalities of breathing: Secondary | ICD-10-CM | POA: Diagnosis not present

## 2024-05-27 DIAGNOSIS — I700 Atherosclerosis of aorta: Secondary | ICD-10-CM | POA: Diagnosis not present

## 2024-05-27 LAB — LACTIC ACID, PLASMA: Lactic Acid, Venous: 1.2 mmol/L (ref 0.5–1.9)

## 2024-05-27 LAB — URINALYSIS, W/ REFLEX TO CULTURE (INFECTION SUSPECTED)
Bacteria, UA: NONE SEEN
Bilirubin Urine: NEGATIVE
Glucose, UA: NEGATIVE mg/dL
Hgb urine dipstick: NEGATIVE
Ketones, ur: NEGATIVE mg/dL
Leukocytes,Ua: NEGATIVE
Nitrite: NEGATIVE
Protein, ur: NEGATIVE mg/dL
Specific Gravity, Urine: 1.009 (ref 1.005–1.030)
pH: 5 (ref 5.0–8.0)

## 2024-05-27 LAB — COMPREHENSIVE METABOLIC PANEL WITH GFR
ALT: 22 U/L (ref 0–44)
AST: 21 U/L (ref 15–41)
Albumin: 4 g/dL (ref 3.5–5.0)
Alkaline Phosphatase: 66 U/L (ref 38–126)
Anion gap: 9 (ref 5–15)
BUN: 25 mg/dL — ABNORMAL HIGH (ref 8–23)
CO2: 26 mmol/L (ref 22–32)
Calcium: 9.3 mg/dL (ref 8.9–10.3)
Chloride: 104 mmol/L (ref 98–111)
Creatinine, Ser: 1.26 mg/dL — ABNORMAL HIGH (ref 0.61–1.24)
GFR, Estimated: 56 mL/min — ABNORMAL LOW (ref >60)
Glucose, Bld: 141 mg/dL — ABNORMAL HIGH (ref 70–99)
Potassium: 4 mmol/L (ref 3.5–5.1)
Sodium: 139 mmol/L (ref 135–145)
Total Bilirubin: 0.7 mg/dL (ref 0.0–1.2)
Total Protein: 7.1 g/dL (ref 6.5–8.1)

## 2024-05-27 LAB — CBC WITH DIFFERENTIAL/PLATELET
Abs Immature Granulocytes: 0.02 K/uL (ref 0.00–0.07)
Basophils Absolute: 0 K/uL (ref 0.0–0.1)
Basophils Relative: 1 %
Eosinophils Absolute: 0.2 K/uL (ref 0.0–0.5)
Eosinophils Relative: 4 %
HCT: 35 % — ABNORMAL LOW (ref 39.0–52.0)
Hemoglobin: 11.7 g/dL — ABNORMAL LOW (ref 13.0–17.0)
Immature Granulocytes: 0 %
Lymphocytes Relative: 22 %
Lymphs Abs: 1.4 K/uL (ref 0.7–4.0)
MCH: 29.9 pg (ref 26.0–34.0)
MCHC: 33.4 g/dL (ref 30.0–36.0)
MCV: 89.5 fL (ref 80.0–100.0)
Monocytes Absolute: 0.4 K/uL (ref 0.1–1.0)
Monocytes Relative: 6 %
Neutro Abs: 4.4 K/uL (ref 1.7–7.7)
Neutrophils Relative %: 67 %
Platelets: 160 K/uL (ref 150–400)
RBC: 3.91 MIL/uL — ABNORMAL LOW (ref 4.22–5.81)
RDW: 13.4 % (ref 11.5–15.5)
WBC: 6.4 K/uL (ref 4.0–10.5)
nRBC: 0 % (ref 0.0–0.2)

## 2024-05-27 LAB — AMMONIA: Ammonia: 16 umol/L (ref 9–35)

## 2024-05-27 LAB — TSH: TSH: 1.453 u[IU]/mL (ref 0.350–4.500)

## 2024-05-27 NOTE — ED Notes (Signed)
 [facility] called for transport

## 2024-05-27 NOTE — ED Provider Notes (Signed)
 [place] EMERGENCY DEPARTMENT AT [facility] Provider Note   CSN: [id number] Arrival date & time: 05/27/24  1318     Patient presents with: Altered Mental Status   Johnny Kane is a 83 y.o. male.   The history is provided by the patient, medical records and a relative. The history is limited by the condition of the patient. No language interpreter was used.  Altered Mental Status Presenting symptoms: behavior changes and confusion   Severity:  Moderate Most recent episode:  Today Episode history:  Continuous Timing:  Constant Progression:  Waxing and waning Chronicity:  New Context: dementia   Associated symptoms: agitation (per EMS was agitated at facility)   Associated symptoms: no abdominal pain, no fever, no hallucinations, no headaches, no light-headedness, no nausea, no palpitations, no rash, no seizures, no vomiting and no weakness        Prior to Admission medications   Medication Sig Start Date End Date Taking? Authorizing Provider  busPIRone (BUSPAR) 10 MG tablet Take 1 tablet by mouth 3 (three) times daily.  11/23/17   [provider]  clopidogrel  (PLAVIX ) 75 MG tablet TAKE 1 TABLET BY MOUTH DAILY 12/15/23   Anner Alm ORN, MD  Cyanocobalamin (VITAMIN B-12) 5000 MCG TBDP Take 5,000 mcg by mouth daily after lunch.    [provider]  escitalopram (LEXAPRO) 20 MG tablet Take 20 mg by mouth daily. 07/23/17   [provider]  famotidine  (PEPCID ) 20 MG tablet Take 20 mg by mouth See admin instructions. Take 1 tablet (20 mg) by mouth twice daily - after lunch and at bedtime 01/12/17   [provider]  furosemide  (LASIX ) 20 MG tablet Take 1 tablet (20 mg total) by mouth daily. 12/24/23   Anner Alm ORN, MD  Guaifenesin 1200 MG TB12 Take 1,200 mg by mouth daily as needed (congestion).    [provider]  isosorbide  mononitrate (IMDUR ) 60 MG 24 hr tablet Take 1 tablet (60 mg total) by mouth 2 (two) times daily.  02/11/24   Anner Alm ORN, MD  modafinil  (PROVIGIL ) 200 MG tablet Take 200 mg by mouth See admin instructions. Take 1 tablet (200 mg) by mouth twice daily - every morning and at 4:30pm    [provider]  nitroGLYCERIN  (NITROSTAT ) 0.4 MG SL tablet DISSOLVE 1 TABLET UNDER THE  TONGUE EVERY 5 MINUTES AS NEEDED FOR CHEST PAIN. MAX OF 3 TABLETS IN 15 MINUTES. CALL 911 IF PAIN  PERSISTS. 11/03/23   Goodrich, Callie E, PA-C  polyethylene glycol (MIRALAX / GLYCOLAX) packet Take 17 g by mouth daily as needed (constipation). Mix in 8 oz liquid and drink    [provider]    Allergies: Ciprofloxacin , Donepezil , Esomeprazole magnesium, Penicillins, Soma [carisoprodol], and Sulfa drugs cross reactors    Review of Systems  Constitutional:  Positive for fatigue. Negative for chills and fever.  HENT:  Negative for congestion.   Eyes:  Negative for visual disturbance.  Respiratory:  Negative for cough, chest tightness, shortness of breath and wheezing.   Cardiovascular:  Negative for chest pain, palpitations and leg swelling.  Gastrointestinal:  Negative for abdominal pain, constipation, diarrhea, nausea and vomiting.  Genitourinary:  Positive for frequency and urgency. Negative for dysuria and flank pain.  Musculoskeletal:  Negative for back pain, neck pain and neck stiffness.  Skin:  Negative for rash and wound.  Neurological:  Negative for dizziness, seizures, weakness, light-headedness and headaches.  Psychiatric/Behavioral:  Positive for agitation (per EMS was agitated at  facility) and confusion. Negative for hallucinations.   All other systems reviewed and are negative.   Updated Vital Signs SpO2 98%   Physical Exam Vitals and nursing note reviewed.  Constitutional:      General: He is not in acute distress.    Appearance: He is well-developed. He is not ill-appearing, toxic-appearing or diaphoretic.  HENT:     Head: Normocephalic and atraumatic.     Nose: No congestion or  rhinorrhea.     Mouth/Throat:     Mouth: Mucous membranes are moist.     Pharynx: No oropharyngeal exudate or posterior oropharyngeal erythema.  Eyes:     Extraocular Movements: Extraocular movements intact.     Conjunctiva/sclera: Conjunctivae normal.     Pupils: Pupils are equal, round, and reactive to light.  Neck:     Vascular: No carotid bruit.  Cardiovascular:     Rate and Rhythm: Normal rate and regular rhythm.     Heart sounds: No murmur heard. Pulmonary:     Effort: Pulmonary effort is normal. No respiratory distress.     Breath sounds: Normal breath sounds. No wheezing, rhonchi or rales.  Chest:     Chest wall: No tenderness.  Abdominal:     General: Abdomen is flat.     Palpations: Abdomen is soft.     Tenderness: There is no abdominal tenderness.  Musculoskeletal:        General: No swelling, tenderness or signs of injury.     Cervical back: Neck supple. No tenderness.     Right lower leg: No edema.     Left lower leg: No edema.  Skin:    General: Skin is warm and dry.     Capillary Refill: Capillary refill takes less than 2 seconds.     Findings: No erythema or rash.  Neurological:     Mental Status: He is alert.     Sensory: No sensory deficit.     Motor: No weakness.  Psychiatric:        Mood and Affect: Mood normal.     (all labs ordered are listed, but only abnormal results are displayed) Labs Reviewed  CBC WITH DIFFERENTIAL/PLATELET - Abnormal; Notable for the following components:      Result Value   RBC 3.91 (*)    Hemoglobin 11.7 (*)    HCT 35.0 (*)    All other components within normal limits  COMPREHENSIVE METABOLIC PANEL WITH GFR - Abnormal; Notable for the following components:   Glucose, Bld 141 (*)    BUN 25 (*)    Creatinine, Ser 1.26 (*)    GFR, Estimated 56 (*)    All other components within normal limits  URINALYSIS, W/ REFLEX TO CULTURE (INFECTION SUSPECTED) - Abnormal; Notable for the following components:   Color, Urine STRAW  (*)    All other components within normal limits  URINE CULTURE  LACTIC ACID, PLASMA  AMMONIA  LACTIC ACID, PLASMA  CBC WITH DIFFERENTIAL/PLATELET  TSH    EKG: None  Radiology: DG Chest 2 View Result Date: 05/27/2024 CLINICAL DATA:  altered mental status worse then baseline dementia, possible UTI EXAM: CHEST - 2 VIEW COMPARISON:  January 05, 2015 FINDINGS: No focal airspace consolidation, pleural effusion, or pneumothorax. No cardiomegaly. Small hiatal hernia. Tortuous aorta with aortic atherosclerosis. No acute fracture or destructive lesions. Osteopenia. Multilevel thoracic osteophytosis. Cholecystectomy clips. IMPRESSION: Low lung volumes.  Otherwise, no acute cardiopulmonary abnormality. Electronically Signed   By: Rogelia Myers M.D.   On:  05/27/2024 15:57   CT Head Wo Contrast Result Date: 05/27/2024 CLINICAL DATA:  Mental status change, unknown cause More confusion, disorientation, urinary frequency and urgency. More confused than his baseline dementia today. EXAM: CT HEAD WITHOUT CONTRAST TECHNIQUE: Contiguous axial images were obtained from the base of the skull through the vertex without intravenous contrast. RADIATION DOSE REDUCTION: This exam was performed according to the departmental dose-optimization program which includes automated exposure control, adjustment of the mA and/or kV according to patient size and/or use of iterative reconstruction technique. COMPARISON:  None Available. FINDINGS: Brain: Proportional prominence of the ventricles and sulci, consistent with diffuse cerebral parenchymal volume loss. The ventricles otherwise maintained midline position without midline shift. Gray-white differentiation is preserved.Periventricular and subcortical white matter hypoattenuation, most consistent with changes of moderate chronic ischemic microvascular disease.No evidence of acute territorial infarction, extra-axial fluid collection, hemorrhage, or mass lesion. The basilar cisterns  are patent without downward herniation. The cerebellar hemispheres and vermis are well formed without mass lesion or focal attenuation abnormality. Vascular: No hyperdense vessel. Calcified atherosclerotic plaque within the cavernous/supraclinoid ICA and intradural vertebral arteries. Skull: Normal. Negative for fracture or focal lesion. Sinuses/Orbits: The paranasal sinuses and mastoids are clear.The globes appear intact. No retrobulbar hematoma.Bilateral lens replacements. Other: None. IMPRESSION: 1. No acute intracranial abnormality, specifically, no acute hemorrhage, territorial infarction, or intracranial mass. 2. Global cerebral volume loss with sequelae of chronic ischemic microvascular disease. Electronically Signed   By: Rogelia Myers M.D.   On: 05/27/2024 15:47     Procedures   Medications Ordered in the ED - No data to display                                  Medical Decision Making Amount and/or Complexity of Data Reviewed Labs: ordered. Radiology: ordered.    Johnny Kane is a 83 y.o. male with a past medical history significant for hypertension, hyperlipidemia, CAD with PCI, AAA, hiatal hernia, diabetes, diverticulosis, previous cholecystectomy, and dementia who presents with urinary frequency, urgency, and altered mental status with confusion and agitation.  According to EMS report to nursing, patient was at his baseline until today when he has been more confused and agitated at times.  Patient says that he has had urgency and frequency and feels like he is constant going to the bathroom that is different than normal.  Denies dysuria.  Denies fevers, chills, congestion, cough, nausea, vomiting, constipation or diarrhea.  Denies any trauma or falls.  Denies any pain whatsoever with no headache neck pain chest pain or back pain.  Denies any extremity pains.  Patient's daughter arrived and said that yesterday was a good day for him and he was lucid and answering questions  appropriately and ambulating well.  She was called by the facility saying that overnight patient was more disoriented and confused and was trying to tear up his room to look for a laptop that he does not have.  He is tangential with his discussions and says that he just wants to go mowed grass on his 7 acres.  Family does say that his mental status today is worse than it has been and something seems very different today than yesterday.  She is concerned about some thing else going on.  On exam, lungs were clear.  Chest nontender.  Abdomen nontender.  Symmetric smile.  Clear speech.  Pupil symmetric and reactive with normal extraocular movements.  No evidence of acute  trauma.    Clinically I am concerned that patient could have more confusion in the setting of a UTI given his urinary symptoms.  Will get urinalysis and labs.  Will get x-ray to look for occult infection as well.  Will check electrolytes to look for some disturbance.  Will get CT head given his age and the altered mental status in a day.  Anticipate shared decision-making conversation on management after workup is completed if patient is safe for discharge home or not.     Patient still waiting on his urinalysis and chest x-ray.    4:21 PM Surprisingly, urinalysis does not show UTI.  A culture was sent.  X-ray does not show acute pneumonia. TSH still in process and can follow-up with his PCP.  Had a long conversation with family and they agree with plan for discharge home given his otherwise reassuring workup and think this could just be progression of his dementia.  They will have him follow-up with his PCP in the next Avril days and understood return precautions.  They no other questions or concerns and was discharged in good condition.     Final diagnoses:  Altered mental status, unspecified altered mental status type  Confusion  Urinary urgency    ED Discharge Orders     None       Clinical Impression: 1. Altered  mental status, unspecified altered mental status type   2. Confusion   3. Urinary urgency     Disposition: Discharge  Condition: Good  I have discussed the results, Dx and Tx plan with the pt(& family if present). He/she/they expressed understanding and agree(s) with the plan. Discharge instructions discussed at great length. Strict return precautions discussed and pt &/or family have verbalized understanding of the instructions. No further questions at time of discharge.    New Prescriptions   No medications on file    Follow Up: No follow-up provider specified.    Ivanna Kocak, Lonni PARAS, MD 05/27/24 [phone]

## 2024-05-27 NOTE — ED Notes (Signed)
 Patient transported to CT

## 2024-05-27 NOTE — ED Triage Notes (Signed)
 Pt BIB by ems from [facility]. The facility called because they said he was more altered than baseline and more agitated now a days. VS stable.

## 2024-05-27 NOTE — Discharge Instructions (Signed)
 Your history, exam and workup today did not reveal a clear cause of your altered mental status.  Your urinalysis did not show evidence of urinary tract infection but a culture was sent.  The imaging did not show acute traumatic injuries and your labs are otherwise reassuring.  We had a shared decision-making conversation and feel you are safe for discharge home given your lack of focal deficits.  Please follow-up with your primary doctor and rest and stay hydrated.  If any symptoms change or worsen acutely, please return to the nearest emergency department.

## 2024-05-28 LAB — URINE CULTURE: Culture: NO GROWTH

## 2024-06-07 ENCOUNTER — Encounter (HOSPITAL_BASED_OUTPATIENT_CLINIC_OR_DEPARTMENT_OTHER): Payer: Self-pay | Admitting: Emergency Medicine

## 2024-06-07 ENCOUNTER — Other Ambulatory Visit: Payer: Self-pay

## 2024-06-07 ENCOUNTER — Emergency Department (HOSPITAL_BASED_OUTPATIENT_CLINIC_OR_DEPARTMENT_OTHER)
Admission: EM | Admit: 2024-06-07 | Discharge: 2024-06-07 | Disposition: A | Discharge status: Skilled Nursing Facility | Attending: Emergency Medicine | Admitting: Emergency Medicine

## 2024-06-07 DIAGNOSIS — I10 Essential (primary) hypertension: Secondary | ICD-10-CM | POA: Insufficient documentation

## 2024-06-07 DIAGNOSIS — Z743 Need for continuous supervision: Secondary | ICD-10-CM | POA: Diagnosis not present

## 2024-06-07 DIAGNOSIS — Z955 Presence of coronary angioplasty implant and graft: Secondary | ICD-10-CM | POA: Insufficient documentation

## 2024-06-07 DIAGNOSIS — Z96653 Presence of artificial knee joint, bilateral: Secondary | ICD-10-CM | POA: Insufficient documentation

## 2024-06-07 DIAGNOSIS — I2510 Atherosclerotic heart disease of native coronary artery without angina pectoris: Secondary | ICD-10-CM | POA: Diagnosis not present

## 2024-06-07 DIAGNOSIS — Z87891 Personal history of nicotine dependence: Secondary | ICD-10-CM | POA: Insufficient documentation

## 2024-06-07 DIAGNOSIS — H1131 Conjunctival hemorrhage, right eye: Secondary | ICD-10-CM | POA: Insufficient documentation

## 2024-06-07 DIAGNOSIS — E119 Type 2 diabetes mellitus without complications: Secondary | ICD-10-CM | POA: Insufficient documentation

## 2024-06-07 DIAGNOSIS — H5789 Other specified disorders of eye and adnexa: Secondary | ICD-10-CM | POA: Diagnosis present

## 2024-06-07 DIAGNOSIS — Z79899 Other long term (current) drug therapy: Secondary | ICD-10-CM | POA: Insufficient documentation

## 2024-06-07 DIAGNOSIS — Z7401 Bed confinement status: Secondary | ICD-10-CM | POA: Diagnosis not present

## 2024-06-07 DIAGNOSIS — H579 Unspecified disorder of eye and adnexa: Secondary | ICD-10-CM | POA: Diagnosis not present

## 2024-06-07 DIAGNOSIS — R0902 Hypoxemia: Secondary | ICD-10-CM | POA: Diagnosis not present

## 2024-06-07 LAB — CBC WITH DIFFERENTIAL/PLATELET
Abs Immature Granulocytes: 0.02 K/uL (ref 0.00–0.07)
Basophils Absolute: 0 K/uL (ref 0.0–0.1)
Basophils Relative: 0 %
Eosinophils Absolute: 0.3 K/uL (ref 0.0–0.5)
Eosinophils Relative: 4 %
HCT: 31.7 % — ABNORMAL LOW (ref 39.0–52.0)
Hemoglobin: 10.7 g/dL — ABNORMAL LOW (ref 13.0–17.0)
Immature Granulocytes: 0 %
Lymphocytes Relative: 20 %
Lymphs Abs: 1.3 K/uL (ref 0.7–4.0)
MCH: 30.1 pg (ref 26.0–34.0)
MCHC: 33.8 g/dL (ref 30.0–36.0)
MCV: 89.3 fL (ref 80.0–100.0)
Monocytes Absolute: 0.4 K/uL (ref 0.1–1.0)
Monocytes Relative: 7 %
Neutro Abs: 4.4 K/uL (ref 1.7–7.7)
Neutrophils Relative %: 69 %
Platelets: 152 K/uL (ref 150–400)
RBC: 3.55 MIL/uL — ABNORMAL LOW (ref 4.22–5.81)
RDW: 14 % (ref 11.5–15.5)
WBC: 6.4 K/uL (ref 4.0–10.5)
nRBC: 0 % (ref 0.0–0.2)

## 2024-06-07 MED ORDER — TETRACAINE HCL 0.5 % OP SOLN
2.0000 [drp] | Freq: Once | OPHTHALMIC | Status: AC
  Administered 2024-06-07: 2 [drp] via OPHTHALMIC
  Filled 2024-06-07: qty 4

## 2024-06-07 MED ORDER — FLUORESCEIN SODIUM 1 MG OP STRP
1.0000 | ORAL_STRIP | Freq: Once | OPHTHALMIC | Status: AC
  Administered 2024-06-07: 1 via OPHTHALMIC
  Filled 2024-06-07: qty 1

## 2024-06-07 NOTE — ED Notes (Signed)
[facility] here to transport  

## 2024-06-07 NOTE — ED Provider Notes (Signed)
 [place] EMERGENCY DEPARTMENT AT [facility] Provider Note  CSN: [id number] Arrival date & time: 06/07/24 1804  Chief Complaint(s) Eye Problem  HPI Johnny Kane is a 83 y.o. male who is here today with redness in the right eye.  Patient on Plavix  for history of CAD, has dementia, lives at [facility].  Eye redness has been noticed over the last few days, has worsened.  No trauma.   Past Medical History Past Medical History:  Diagnosis Date   Aortic calcification (HCC)    No evidence of AAA on CT Abd in 2013   Aortic valve sclerosis    a. 12/2013 Echo: Aortic Sclerosis; EF 60-65%, Gr1 DD, mild AI, mildly dil LA.   CAD S/P percutaneous coronary angioplasty    a. 07/2011 Nl MV;  b. 11/2013 Cath: LM nl, LAD 20p, D1/2 nl, LCX 30p, OM1 40, OM2 nl, RCA 90-54m (3.5x18 Xience DES). c. Myoview  04/2014: No ischmia/Infarction, normal EF. d. patent stent by cath in 10/2016.   Depression    Diabetes mellitus without complication (HCC)    Diverticulosis of colon    GERD (gastroesophageal reflux disease)    Hiatal hernia    History of MRSA infection    Hyperlipidemia    Hypertension    Low testosterone     OA (osteoarthritis)    Palpitations    Palpitations    Sleep apnea    a. mild not on cpap    Vasovagal episode    Vitamin B 12 deficiency    Patient Active Problem List   Diagnosis Date Noted   Exercise-induced leg fatigue 09/12/2019   Heart palpitations 12/07/2017   Abnormal nuclear stress test - due to inaccurate assessment of EF 10/23/2016   Near syncope 12/26/2015   Symptomatic PVCs 12/07/2014   Memory loss or impairment 11/16/2014   Obesity (BMI 30-39.9) 04/04/2014   Fatigue 04/04/2014   Weakness 03/07/2014   CAD S/P percutaneous coronary angioplasty - RCA DES 11/25/13 11/26/2013   AAA (abdominal aortic aneurysm) (HCC)    Essential hypertension    Hyperlipidemia with target LDL less than 70    DOE (dyspnea on exertion) -     Home Medication(s) Prior to Admission  medications   Medication Sig Start Date End Date Taking? Authorizing Provider  busPIRone (BUSPAR) 10 MG tablet Take 1 tablet by mouth 3 (three) times daily.  11/23/17   [provider]  clopidogrel  (PLAVIX ) 75 MG tablet TAKE 1 TABLET BY MOUTH DAILY 12/15/23   Anner Alm ORN, MD  Cyanocobalamin (VITAMIN B-12) 5000 MCG TBDP Take 5,000 mcg by mouth daily after lunch.    [provider]  escitalopram (LEXAPRO) 20 MG tablet Take 20 mg by mouth daily. 07/23/17   [provider]  famotidine  (PEPCID ) 20 MG tablet Take 20 mg by mouth See admin instructions. Take 1 tablet (20 mg) by mouth twice daily - after lunch and at bedtime 01/12/17   [provider]  furosemide  (LASIX ) 20 MG tablet Take 1 tablet (20 mg total) by mouth daily. 12/24/23   Anner Alm ORN, MD  Guaifenesin 1200 MG TB12 Take 1,200 mg by mouth daily as needed (congestion).    [provider]  isosorbide  mononitrate (IMDUR ) 60 MG 24 hr tablet Take 1 tablet (60 mg total) by mouth 2 (two) times daily. 02/11/24   Anner Alm ORN, MD  modafinil  (PROVIGIL ) 200 MG tablet Take 200 mg by mouth See admin instructions. Take 1 tablet (200 mg) by mouth twice daily - every  morning and at 4:30pm    [provider]  nitroGLYCERIN  (NITROSTAT ) 0.4 MG SL tablet DISSOLVE 1 TABLET UNDER THE  TONGUE EVERY 5 MINUTES AS NEEDED FOR CHEST PAIN. MAX OF 3 TABLETS IN 15 MINUTES. CALL 911 IF PAIN  PERSISTS. 11/03/23   Goodrich, Callie E, PA-C  polyethylene glycol (MIRALAX / GLYCOLAX) packet Take 17 g by mouth daily as needed (constipation). Mix in 8 oz liquid and drink    [provider]                                                                                                                                    Past Surgical History Past Surgical History:  Procedure Laterality Date   CARDIAC CATHETERIZATION N/A 10/23/2016   Procedure: Left Heart Cath and Coronary Angiography;  Surgeon: Alm LELON Clay, MD;   Location: [facility] INVASIVE CV LAB;  Service: Cardiovascular:: Widely patent RCA stent and otherwise minimal CAD. EF 50-55%. Normal LVEDP   CHOLECYSTECTOMY N/A 07/20/2013   Procedure: LAPAROSCOPIC CHOLECYSTECTOMY;  Surgeon: Debby LABOR. Cornett, MD;  Location: [facility] ORS;  Service: General;  Laterality: N/A;   CORONARY ANGIOPLASTY WITH STENT PLACEMENT  11/25/2013   mRCA 90-95% - 0% (3.5x18 Xience DES).   LEFT HEART CATHETERIZATION WITH CORONARY ANGIOGRAM N/A 11/25/2013   Procedure: LEFT HEART CATHETERIZATION WITH CORONARY ANGIOGRAM;  Surgeon: Alm LELON Clay, MD;  Location: [facility] CATH LAB;  Service: Cardiovascular: LM nl, LAD 20p, D1/2 nl, LCX 30p, OM1 40, OM2 nl, RCA 90-33m -- DES PCI.    NM MYOVIEW  LTD  10/2016   EF 44% with diffuse hypokinesis.  Otherwise no ischemia or infarction. -->  Follow-up catheterization showed widely patent RCA stent, otherwise normal coronary arteries with EF 55% and normal LVEDP.   REPLACEMENT TOTAL KNEE     one on each knee   TONSILLECTOMY     TRANSTHORACIC ECHOCARDIOGRAM  February 2015   EF 60-65%, Gr1DD, mild AI with aortic sclerosis, mildly dilated LA.   TRANSTHORACIC ECHOCARDIOGRAM  03/2017   mild LVH. EF 50-55%. GR1 DD, Elevated LVEDP. Trace AI. Mild Ao root dilation.   Family History Family History  Problem Relation Age of Onset   Asthma Mother    Hypertension Mother    Heart attack Father     Social History Social History   Tobacco Use   Smoking status: Former    Current packs/day: 0.00    Types: Cigarettes    Start date: 11/03/1956    Quit date: 11/04/1967    Years since quitting: 56.6   Smokeless tobacco: Never  Substance Use Topics   Alcohol use: No   Drug use: No   Allergies Ciprofloxacin , Donepezil , Esomeprazole magnesium, Penicillins, Soma [carisoprodol], and Sulfa drugs cross reactors  Review of Systems Review of Systems  Physical Exam Vital Signs  I have reviewed the triage vital signs BP (!) 148/71   Pulse 64   Temp 98.7 F (37.1  C) (Oral)    Resp 20   SpO2 93%   Physical Exam Vitals and nursing note reviewed.  HENT:     Head: Normocephalic.  Eyes:     Extraocular Movements: Extraocular movements intact.     Pupils: Pupils are equal, round, and reactive to light.     Comments: Large subconjunctival hemorrhage on the right eye.  No visual field defects, patient's vision grossly normal, is able to tell me how many fingers I am holding up, denies blurry vision to me.  Cardiovascular:     Rate and Rhythm: Normal rate.  Neurological:     General: No focal deficit present.     Mental Status: He is alert.     ED Results and Treatments Labs (all labs ordered are listed, but only abnormal results are displayed) Labs Reviewed  CBC WITH DIFFERENTIAL/PLATELET - Abnormal; Notable for the following components:      Result Value   RBC 3.55 (*)    Hemoglobin 10.7 (*)    HCT 31.7 (*)    All other components within normal limits                                                                                                                          Radiology No results found.  Pertinent labs & imaging results that were available during my care of the patient were reviewed by me and considered in my medical decision making (see MDM for details).  Medications Ordered in ED Medications  fluorescein  ophthalmic strip 1 strip (1 strip Right Eye Given 06/07/24 1925)  tetracaine  (PONTOCAINE) 0.5 % ophthalmic solution 2 drop (2 drops Right Eye Given 06/07/24 1925)                                                                                                                                     Procedures Procedures  (including critical care time)  Medical Decision Making / ED Course   This patient presents to the ED for concern of eye redness, this involves an extensive number of treatment options, and is a complaint that carries with it a high risk of complications and morbidity.  The differential diagnosis includes subconjunctival  hemorrhage, less likely endophthalmitis, less likely angle-closure glaucoma.  MDM: 83 year old male here today with a red eye.  He has an intraocular pressure of 12.  There is no corneal abrasion.  Peers  to have a subconjunctival hemorrhage.  Iris and pupil spared.  There is no haziness, no pickup on fluorescein  staining.  Vision grossly normal.  Attempted take a picture in the patient's chart, however epic Rover is not allowing me to login.  This appears to be a subconjunctival hemorrhage.  Will have the patient hold his Plavix  for 1 week.  Will provide ophthalmology follow-up.  Mild decrease in his hemoglobin, left patient follow-up with his PCP for repeat blood draws.   Additional history obtained: -Additional history obtained from EMS -External records from outside source obtained and reviewed including: Chart review including previous notes, labs, imaging, consultation notes   Lab Tests: -I ordered, reviewed, and interpreted labs.   The pertinent results include:   Labs Reviewed  CBC WITH DIFFERENTIAL/PLATELET - Abnormal; Notable for the following components:      Result Value   RBC 3.55 (*)    Hemoglobin 10.7 (*)    HCT 31.7 (*)    All other components within normal limits      Medicines ordered and prescription drug management: Meds ordered this encounter  Medications   fluorescein  ophthalmic strip 1 strip   tetracaine  (PONTOCAINE) 0.5 % ophthalmic solution 2 drop    -I have reviewed the patients home medicines and have made adjustments as needed    Cardiac Monitoring: The patient was maintained on a cardiac monitor.  I personally viewed and interpreted the cardiac monitored which showed an underlying rhythm of: Normal sinus rhythm  Social Determinants of Health:  Factors impacting patients care include: Multiple medical comorbidities including dementia, coronary artery disease   Reevaluation: After the interventions noted above, I reevaluated the patient and found  that they have :improved  Co morbidities that complicate the patient evaluation  Past Medical History:  Diagnosis Date   Aortic calcification (HCC)    No evidence of AAA on CT Abd in 2013   Aortic valve sclerosis    a. 12/2013 Echo: Aortic Sclerosis; EF 60-65%, Gr1 DD, mild AI, mildly dil LA.   CAD S/P percutaneous coronary angioplasty    a. 07/2011 Nl MV;  b. 11/2013 Cath: LM nl, LAD 20p, D1/2 nl, LCX 30p, OM1 40, OM2 nl, RCA 90-63m (3.5x18 Xience DES). c. Myoview  04/2014: No ischmia/Infarction, normal EF. d. patent stent by cath in 10/2016.   Depression    Diabetes mellitus without complication (HCC)    Diverticulosis of colon    GERD (gastroesophageal reflux disease)    Hiatal hernia    History of MRSA infection    Hyperlipidemia    Hypertension    Low testosterone     OA (osteoarthritis)    Palpitations    Palpitations    Sleep apnea    a. mild not on cpap    Vasovagal episode    Vitamin B 12 deficiency       Dispostion: I considered admission for this patient, however he is appropriate for outpatient follow-up.     Final Clinical Impression(s) / ED Diagnoses Final diagnoses:  Subconjunctival hemorrhage of right eye     @PCDICTATION @    Mannie Fairy DASEN, DO 06/07/24 2028

## 2024-06-07 NOTE — ED Triage Notes (Addendum)
 BIB [facility]. Dementia- confused at baseline- [facility] staff reports he is at baseline cognition now. Comes in for right eye subconjunctival hemorrhage. On plavix . No falls reported. Blurred vision reported by PT.

## 2024-06-07 NOTE — Discharge Instructions (Signed)
 Johnny Kane was diagnosed with a subconjunctival hemorrhage.  This will resolve on its own with time.  He can stop taking his Plavix  from 8/5 through 8/12.  He may resume taking it at his regularly scheduled dose on 8/13.  Included in the discharge paperwork is a telephone number for an ophthalmologist.  You may call them tomorrow to set up a follow-up appointment.  Ideally, he would be seen within 1 week.  Return to the emergency room if he complains of inability to see of the eye, if you notice swelling around the eye, or if he is unable to move his eye.

## 2024-06-13 DIAGNOSIS — M159 Polyosteoarthritis, unspecified: Secondary | ICD-10-CM | POA: Diagnosis not present

## 2024-06-13 DIAGNOSIS — R7303 Prediabetes: Secondary | ICD-10-CM | POA: Diagnosis not present

## 2024-06-13 DIAGNOSIS — K5900 Constipation, unspecified: Secondary | ICD-10-CM | POA: Diagnosis not present

## 2024-06-13 DIAGNOSIS — E782 Mixed hyperlipidemia: Secondary | ICD-10-CM | POA: Diagnosis not present

## 2024-06-13 DIAGNOSIS — I1310 Hypertensive heart and chronic kidney disease without heart failure, with stage 1 through stage 4 chronic kidney disease, or unspecified chronic kidney disease: Secondary | ICD-10-CM | POA: Diagnosis not present

## 2024-06-13 DIAGNOSIS — N189 Chronic kidney disease, unspecified: Secondary | ICD-10-CM | POA: Diagnosis not present

## 2024-06-13 DIAGNOSIS — D519 Vitamin B12 deficiency anemia, unspecified: Secondary | ICD-10-CM | POA: Diagnosis not present

## 2024-06-13 DIAGNOSIS — H1131 Conjunctival hemorrhage, right eye: Secondary | ICD-10-CM | POA: Diagnosis not present

## 2024-06-13 DIAGNOSIS — K219 Gastro-esophageal reflux disease without esophagitis: Secondary | ICD-10-CM | POA: Diagnosis not present

## 2024-06-13 DIAGNOSIS — R5382 Chronic fatigue, unspecified: Secondary | ICD-10-CM | POA: Diagnosis not present

## 2024-06-13 DIAGNOSIS — Z66 Do not resuscitate: Secondary | ICD-10-CM | POA: Diagnosis not present

## 2024-06-23 DIAGNOSIS — M159 Polyosteoarthritis, unspecified: Secondary | ICD-10-CM | POA: Diagnosis not present

## 2024-06-28 DIAGNOSIS — M159 Polyosteoarthritis, unspecified: Secondary | ICD-10-CM | POA: Diagnosis not present

## 2024-06-29 DIAGNOSIS — M159 Polyosteoarthritis, unspecified: Secondary | ICD-10-CM | POA: Diagnosis not present

## 2024-06-30 DIAGNOSIS — M159 Polyosteoarthritis, unspecified: Secondary | ICD-10-CM | POA: Diagnosis not present

## 2024-07-11 ENCOUNTER — Other Ambulatory Visit: Payer: Self-pay | Admitting: Student

## 2024-07-11 DIAGNOSIS — R7303 Prediabetes: Secondary | ICD-10-CM | POA: Diagnosis not present

## 2024-07-11 DIAGNOSIS — N189 Chronic kidney disease, unspecified: Secondary | ICD-10-CM | POA: Diagnosis not present

## 2024-07-11 DIAGNOSIS — I1310 Hypertensive heart and chronic kidney disease without heart failure, with stage 1 through stage 4 chronic kidney disease, or unspecified chronic kidney disease: Secondary | ICD-10-CM | POA: Diagnosis not present

## 2024-07-12 DIAGNOSIS — R7303 Prediabetes: Secondary | ICD-10-CM | POA: Diagnosis not present

## 2024-07-12 DIAGNOSIS — I1310 Hypertensive heart and chronic kidney disease without heart failure, with stage 1 through stage 4 chronic kidney disease, or unspecified chronic kidney disease: Secondary | ICD-10-CM | POA: Diagnosis not present

## 2024-07-12 DIAGNOSIS — M159 Polyosteoarthritis, unspecified: Secondary | ICD-10-CM | POA: Diagnosis not present

## 2024-07-12 DIAGNOSIS — D519 Vitamin B12 deficiency anemia, unspecified: Secondary | ICD-10-CM | POA: Diagnosis not present

## 2024-07-12 DIAGNOSIS — N189 Chronic kidney disease, unspecified: Secondary | ICD-10-CM | POA: Diagnosis not present

## 2024-07-13 DIAGNOSIS — M159 Polyosteoarthritis, unspecified: Secondary | ICD-10-CM | POA: Diagnosis not present

## 2024-07-15 DIAGNOSIS — M159 Polyosteoarthritis, unspecified: Secondary | ICD-10-CM | POA: Diagnosis not present

## 2024-07-26 DIAGNOSIS — M159 Polyosteoarthritis, unspecified: Secondary | ICD-10-CM | POA: Diagnosis not present

## 2024-07-28 DIAGNOSIS — M159 Polyosteoarthritis, unspecified: Secondary | ICD-10-CM | POA: Diagnosis not present

## 2024-07-29 DIAGNOSIS — Z139 Encounter for screening, unspecified: Secondary | ICD-10-CM | POA: Diagnosis not present

## 2024-07-29 DIAGNOSIS — Z79899 Other long term (current) drug therapy: Secondary | ICD-10-CM | POA: Diagnosis not present

## 2024-07-29 DIAGNOSIS — K219 Gastro-esophageal reflux disease without esophagitis: Secondary | ICD-10-CM | POA: Diagnosis not present

## 2024-07-29 DIAGNOSIS — R5382 Chronic fatigue, unspecified: Secondary | ICD-10-CM | POA: Diagnosis not present

## 2024-07-29 DIAGNOSIS — R7303 Prediabetes: Secondary | ICD-10-CM | POA: Diagnosis not present

## 2024-07-29 DIAGNOSIS — E782 Mixed hyperlipidemia: Secondary | ICD-10-CM | POA: Diagnosis not present

## 2024-07-29 DIAGNOSIS — N1831 Chronic kidney disease, stage 3a: Secondary | ICD-10-CM | POA: Diagnosis not present

## 2024-07-29 DIAGNOSIS — I10 Essential (primary) hypertension: Secondary | ICD-10-CM | POA: Diagnosis not present

## 2024-08-01 DIAGNOSIS — M159 Polyosteoarthritis, unspecified: Secondary | ICD-10-CM | POA: Diagnosis not present

## 2024-08-02 DIAGNOSIS — M159 Polyosteoarthritis, unspecified: Secondary | ICD-10-CM | POA: Diagnosis not present

## 2024-08-08 DIAGNOSIS — D519 Vitamin B12 deficiency anemia, unspecified: Secondary | ICD-10-CM | POA: Diagnosis not present

## 2024-08-08 DIAGNOSIS — R7303 Prediabetes: Secondary | ICD-10-CM | POA: Diagnosis not present

## 2024-08-08 DIAGNOSIS — E782 Mixed hyperlipidemia: Secondary | ICD-10-CM | POA: Diagnosis not present

## 2024-08-11 DIAGNOSIS — D519 Vitamin B12 deficiency anemia, unspecified: Secondary | ICD-10-CM | POA: Diagnosis not present

## 2024-08-11 DIAGNOSIS — R7303 Prediabetes: Secondary | ICD-10-CM | POA: Diagnosis not present

## 2024-08-11 DIAGNOSIS — E7800 Pure hypercholesterolemia, unspecified: Secondary | ICD-10-CM | POA: Diagnosis not present

## 2024-08-24 DIAGNOSIS — M159 Polyosteoarthritis, unspecified: Secondary | ICD-10-CM | POA: Diagnosis not present

## 2024-09-14 ENCOUNTER — Other Ambulatory Visit: Payer: Self-pay | Admitting: Student

## 2024-12-12 ENCOUNTER — Ambulatory Visit: Admitting: Cardiology
